# Patient Record
Sex: Female | Born: 1955 | Race: Black or African American | Hispanic: No | Smoking: Never smoker
Health system: Southern US, Community
[De-identification: ages and names within clinical notes are randomized; demographics above are authoritative.]

## PROBLEM LIST (undated history)

## (undated) DIAGNOSIS — E785 Hyperlipidemia, unspecified: Secondary | ICD-10-CM

## (undated) DIAGNOSIS — K5790 Diverticulosis of intestine, part unspecified, without perforation or abscess without bleeding: Secondary | ICD-10-CM

## (undated) DIAGNOSIS — R5383 Other fatigue: Secondary | ICD-10-CM

## (undated) DIAGNOSIS — E119 Type 2 diabetes mellitus without complications: Secondary | ICD-10-CM

## (undated) DIAGNOSIS — IMO0001 Reserved for inherently not codable concepts without codable children: Secondary | ICD-10-CM

## (undated) DIAGNOSIS — I10 Essential (primary) hypertension: Secondary | ICD-10-CM

## (undated) HISTORY — PX: HERNIA REPAIR: SHX51

## (undated) HISTORY — DX: Type 2 diabetes mellitus without complications: E11.9

## (undated) HISTORY — DX: Reserved for inherently not codable concepts without codable children: IMO0001

## (undated) HISTORY — PX: OVARIAN CYST REMOVAL: SHX89

## (undated) HISTORY — PX: BREAST BIOPSY: SHX20

## (undated) HISTORY — PX: OTHER SURGICAL HISTORY: SHX169

## (undated) HISTORY — DX: Other fatigue: R53.83

---

## 2002-09-05 ENCOUNTER — Encounter: Admission: RE | Admit: 2002-09-05 | Discharge: 2002-09-05 | Payer: Self-pay | Admitting: Family Medicine

## 2002-09-05 ENCOUNTER — Encounter: Payer: Self-pay | Admitting: Family Medicine

## 2004-06-09 ENCOUNTER — Encounter: Admission: RE | Admit: 2004-06-09 | Discharge: 2004-06-09 | Payer: Self-pay | Admitting: Family Medicine

## 2006-05-31 ENCOUNTER — Emergency Department (HOSPITAL_COMMUNITY): Admission: EM | Admit: 2006-05-31 | Discharge: 2006-05-31 | Payer: Self-pay | Admitting: Emergency Medicine

## 2006-11-06 ENCOUNTER — Emergency Department (HOSPITAL_COMMUNITY): Admission: EM | Admit: 2006-11-06 | Discharge: 2006-11-06 | Payer: Self-pay | Admitting: Emergency Medicine

## 2011-07-06 ENCOUNTER — Encounter: Payer: Self-pay | Admitting: Family Medicine

## 2011-07-06 DIAGNOSIS — R5383 Other fatigue: Secondary | ICD-10-CM | POA: Insufficient documentation

## 2014-04-22 ENCOUNTER — Other Ambulatory Visit: Payer: Self-pay | Admitting: Emergency Medicine

## 2014-04-22 DIAGNOSIS — Z1231 Encounter for screening mammogram for malignant neoplasm of breast: Secondary | ICD-10-CM

## 2014-04-27 ENCOUNTER — Inpatient Hospital Stay: Admission: RE | Admit: 2014-04-27 | Payer: Self-pay | Source: Ambulatory Visit

## 2014-05-04 ENCOUNTER — Ambulatory Visit
Admission: RE | Admit: 2014-05-04 | Discharge: 2014-05-04 | Disposition: A | Discharge status: Home / Self Care | Payer: Federal, State, Local not specified - PPO | Source: Ambulatory Visit | Attending: Emergency Medicine | Admitting: Emergency Medicine

## 2014-05-04 DIAGNOSIS — Z1231 Encounter for screening mammogram for malignant neoplasm of breast: Secondary | ICD-10-CM

## 2014-05-08 ENCOUNTER — Other Ambulatory Visit: Payer: Self-pay | Admitting: Family Medicine

## 2014-05-08 DIAGNOSIS — R928 Other abnormal and inconclusive findings on diagnostic imaging of breast: Secondary | ICD-10-CM

## 2014-05-13 ENCOUNTER — Ambulatory Visit
Admission: RE | Admit: 2014-05-13 | Discharge: 2014-05-13 | Disposition: A | Discharge status: Home / Self Care | Payer: Federal, State, Local not specified - PPO | Source: Ambulatory Visit | Attending: Family Medicine | Admitting: Family Medicine

## 2014-05-13 DIAGNOSIS — R928 Other abnormal and inconclusive findings on diagnostic imaging of breast: Secondary | ICD-10-CM

## 2014-12-19 IMAGING — MG MM DIGITAL DIAGNOSTIC UNILAT*R*
2 series · 2 of 2 positions shown · non-contrast
Comparison: Previous exams.

ADDENDUM:
The patient's prior films from 11/08/2012 have been made available.
The asymmetry seen in the slightly lower slightly outer right breast
anterior depth appears unchanged when compared to the prior exam.
There is no mammographic evidence of malignancy in the right breast.

Recommendation: Recommend return to routine screening mammography
BI-RADS: 2: Benign.
CLINICAL DATA: Screening recall for a possible right breast mass.
EXAM:
DIGITAL DIAGNOSTIC  RIGHT MAMMOGRAM
ULTRASOUND RIGHT BREAST

[R CC]
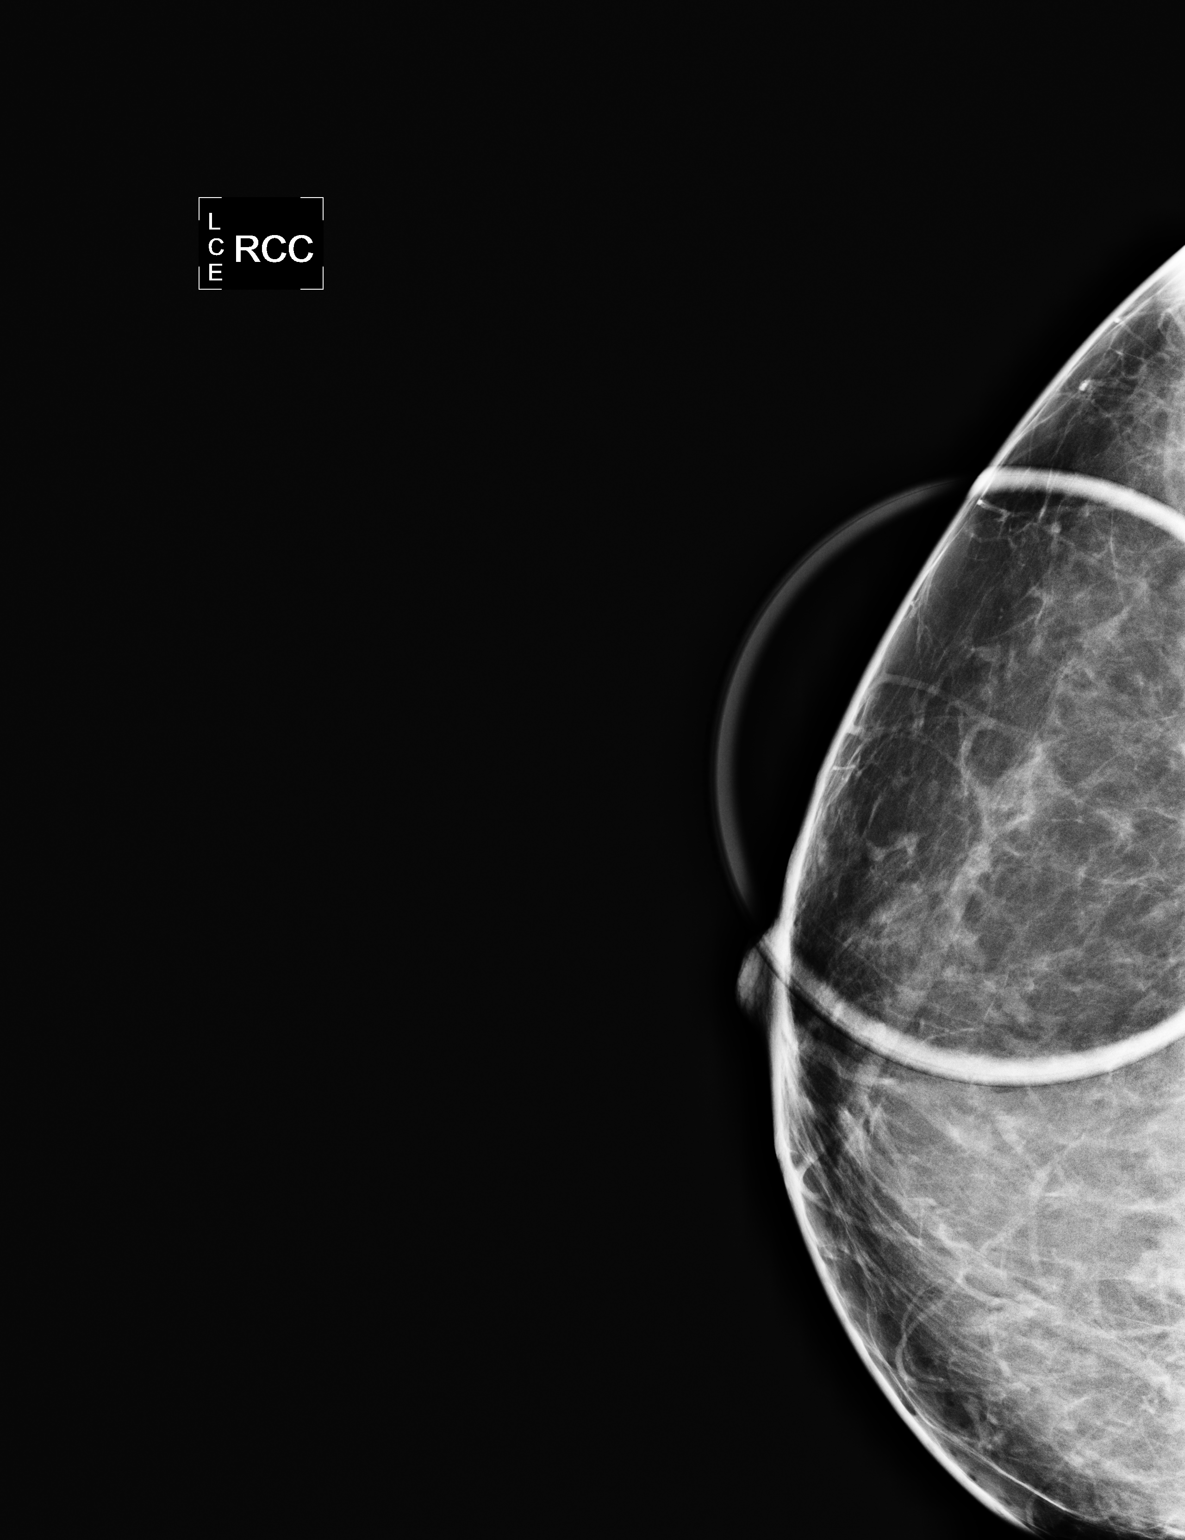

[R MLO]
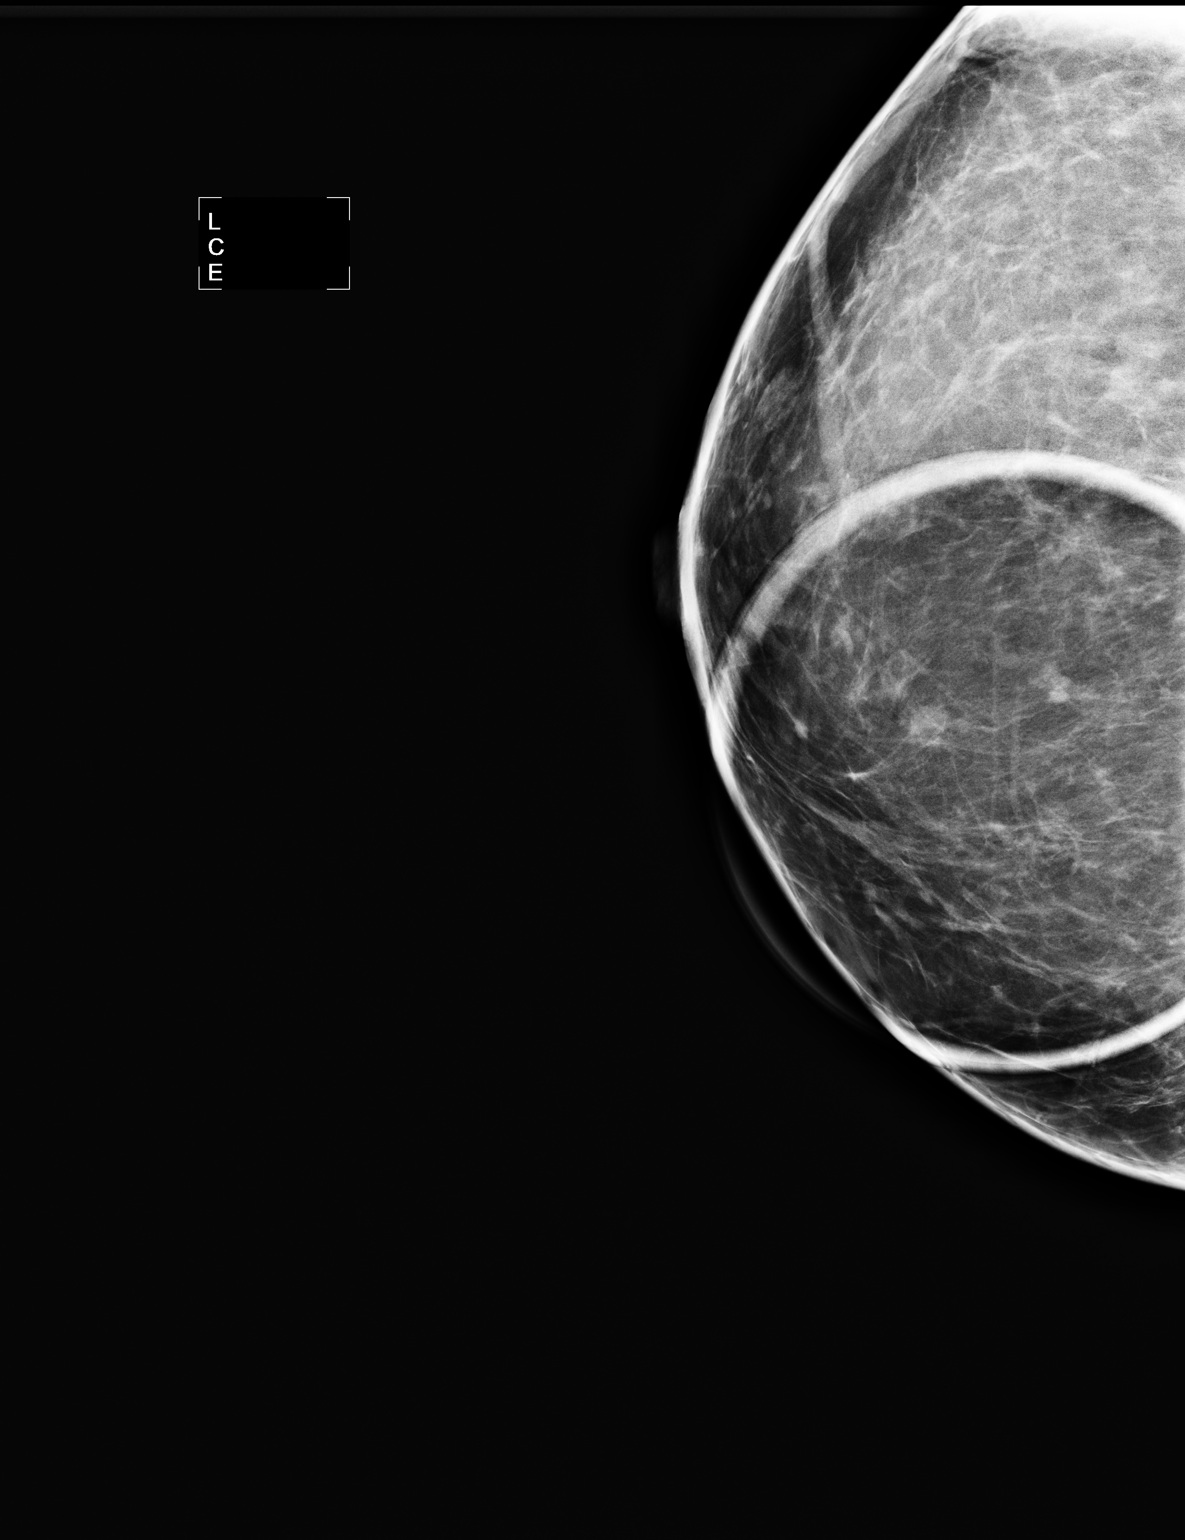

[2 of 2 positions shown; findings below may reference images not displayed]

ACR Breast Density Category b: There are scattered areas of
fibroglandular density.
FINDINGS: There is a small mass in the slightly lower slightly lateral right
breast which is confirmed on the additional spot compression CC and
MLO views measuring approximately 6 mm.

Physical examination of the slightly outer and slightly lower right
breast does not reveal any palpable masses.

Targeted ultrasound of the right breast was performed demonstrating
a small intramammary lymph node at 7 o'clock 2 cm from the nipple
measuring 0.5 x 0.2 x 0.5 cm. This likely corresponds with
mammography findings.
IMPRESSION: Small intramammary lymph node in the right breast which likely
corresponds with the possible mass seen on mammography.

RECOMMENDATION:
1. A six-month follow-up right breast mammogram is recommended to
demonstrate stability of the probably benign right breast asymmetry
(which likely corresponds with the possible mass seen on
mammography).

2. Patient's prior films from [address] in [place] have been requested.
If these films become available, an addendum will be made to this
report.

I have discussed the findings and recommendations with the patient.
Results were also provided in writing at the conclusion of the
visit. If applicable, a reminder letter will be sent to the patient
regarding the next appointment.

BI-RADS CATEGORY  0: Incomplete. Need additional imaging evaluation
and/or prior mammograms for comparison.

## 2014-12-19 IMAGING — US US BREAST LTD UNI RIGHT INC AXILLA
1 series · 5 of 5 positions shown · non-contrast
Comparison: Previous exams.

ADDENDUM:
The patient's prior films from 11/08/2012 have been made available.
The asymmetry seen in the slightly lower slightly outer right breast
anterior depth appears unchanged when compared to the prior exam.
There is no mammographic evidence of malignancy in the right breast.

Recommendation: Recommend return to routine screening mammography
BI-RADS: 2: Benign.
CLINICAL DATA: Screening recall for a possible right breast mass.
EXAM:
DIGITAL DIAGNOSTIC  RIGHT MAMMOGRAM
ULTRASOUND RIGHT BREAST

[Series 1: us breast ltd uni right inc axilla · 5 of 5 slices shown]
[im 1/5]
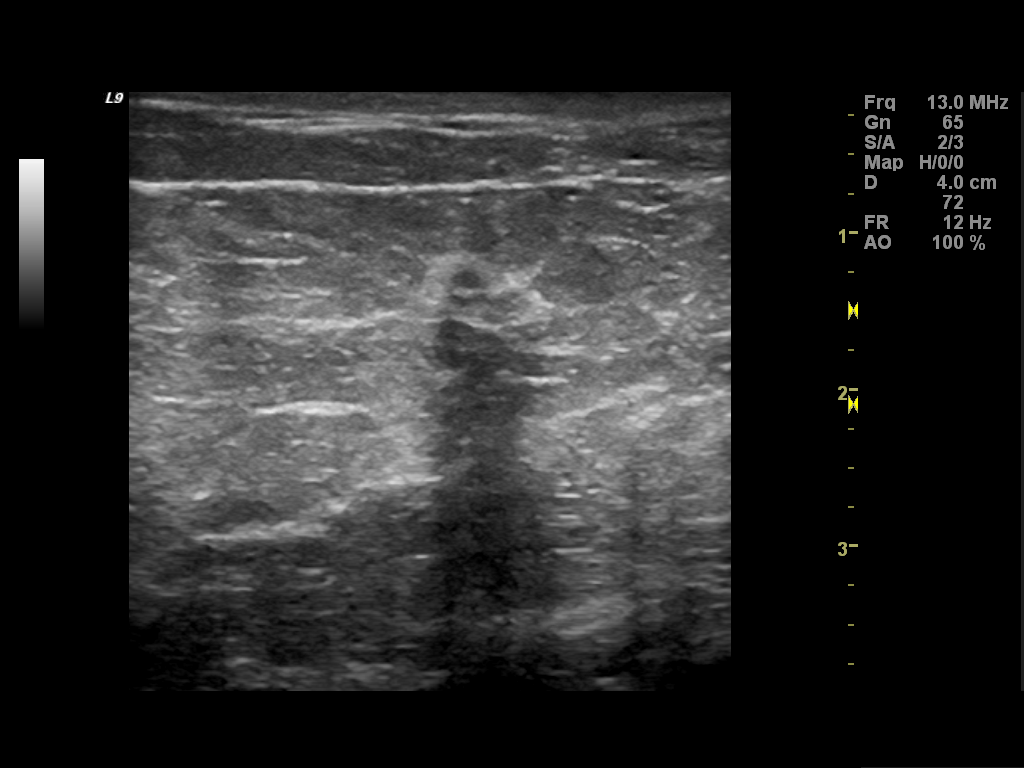
[im 2/5]
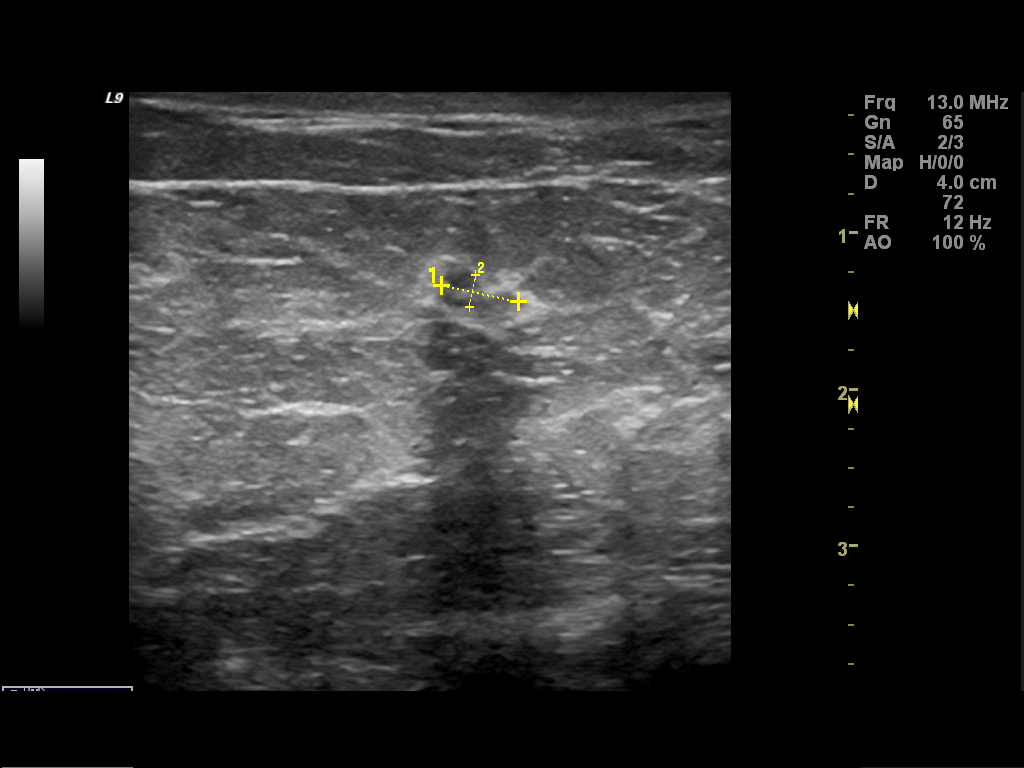
[im 3/5]
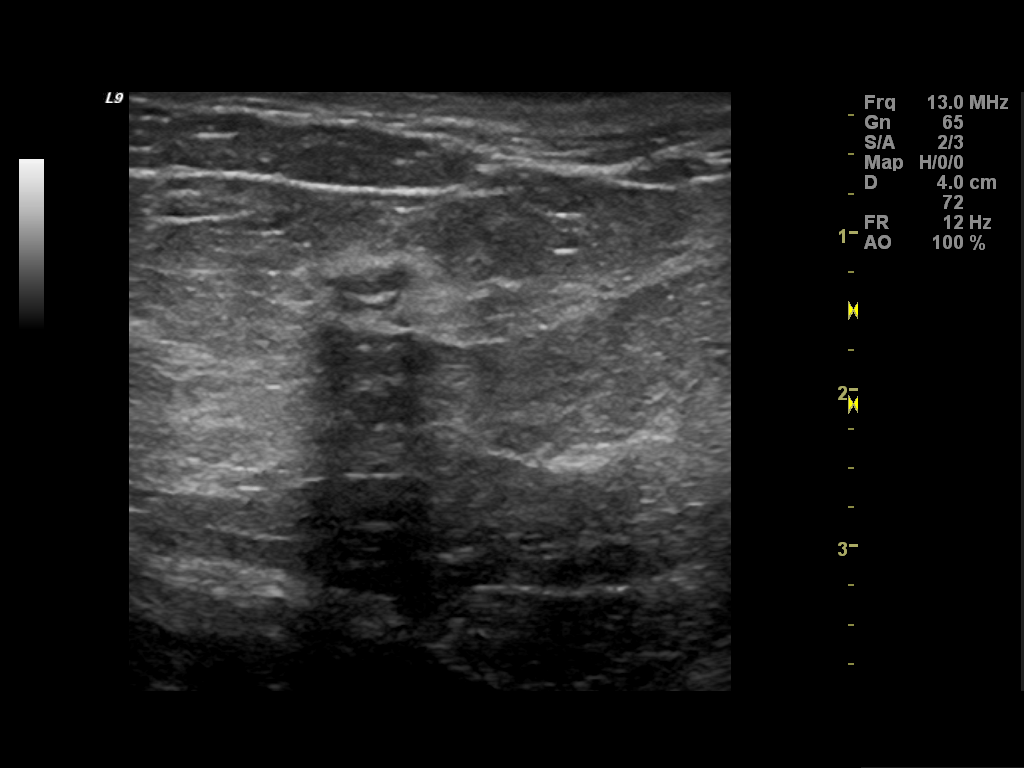
[im 4/5]
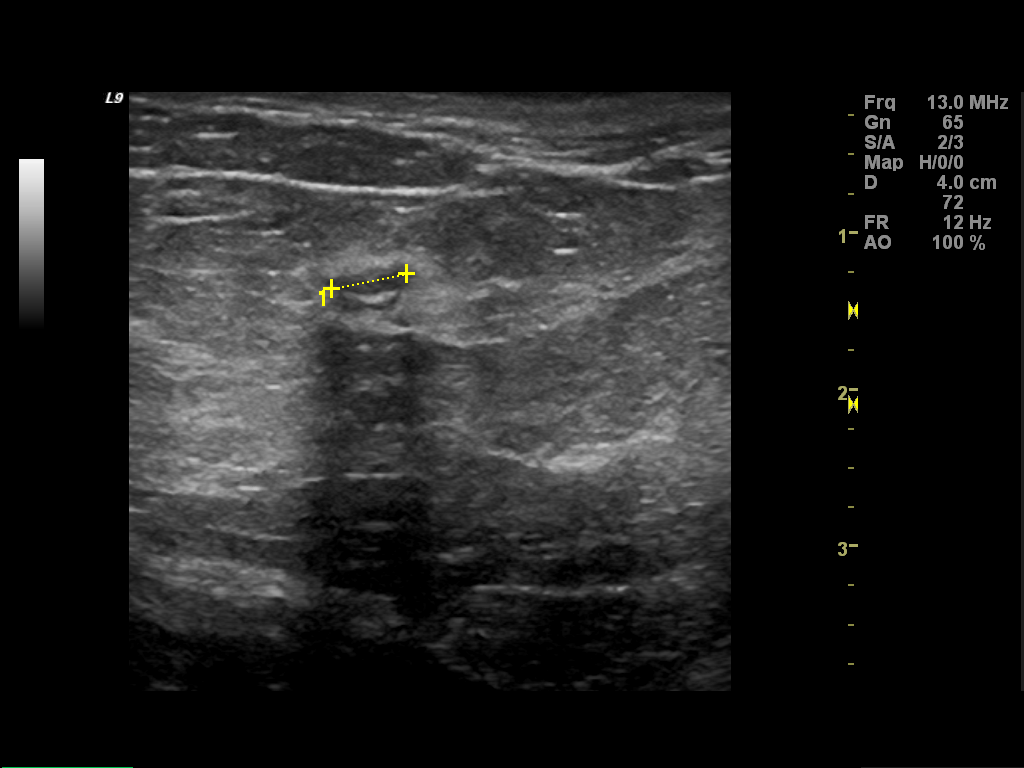
[im 5/5]
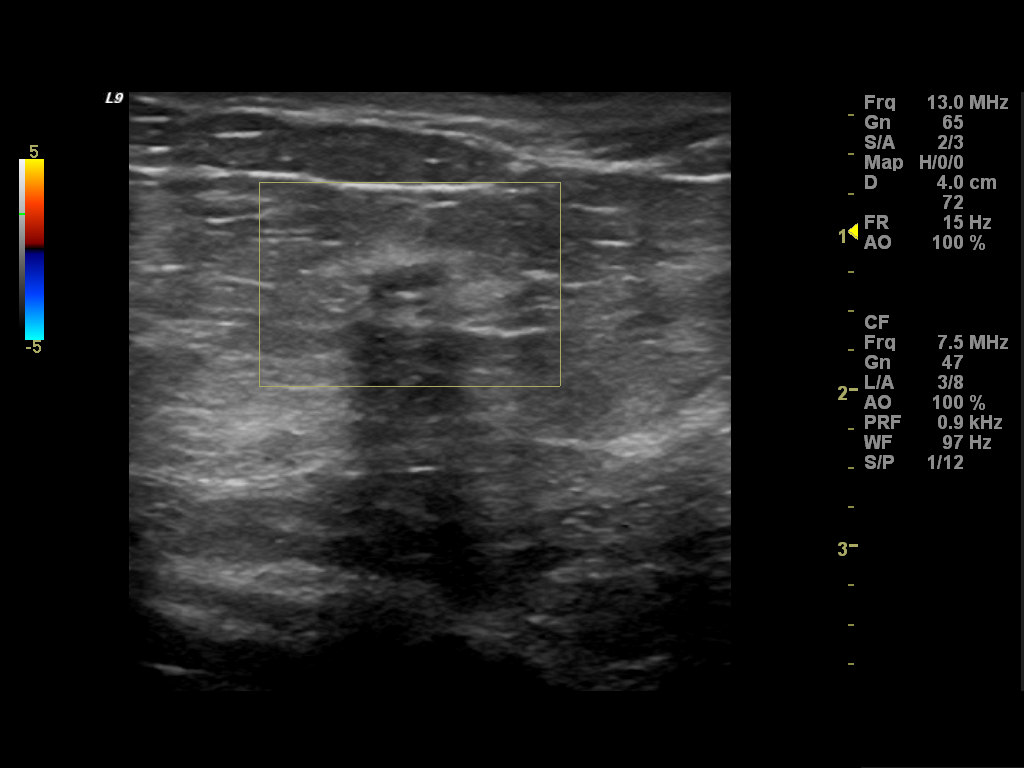

[5 of 5 positions shown; findings below may reference images not displayed]

ACR Breast Density Category b: There are scattered areas of
fibroglandular density.
FINDINGS: There is a small mass in the slightly lower slightly lateral right
breast which is confirmed on the additional spot compression CC and
MLO views measuring approximately 6 mm.

Physical examination of the slightly outer and slightly lower right
breast does not reveal any palpable masses.

Targeted ultrasound of the right breast was performed demonstrating
a small intramammary lymph node at 7 o'clock 2 cm from the nipple
measuring 0.5 x 0.2 x 0.5 cm. This likely corresponds with
mammography findings.
IMPRESSION: Small intramammary lymph node in the right breast which likely
corresponds with the possible mass seen on mammography.

RECOMMENDATION:
1. A six-month follow-up right breast mammogram is recommended to
demonstrate stability of the probably benign right breast asymmetry
(which likely corresponds with the possible mass seen on
mammography).

2. Patient's prior films from [address] in [place] have been requested.
If these films become available, an addendum will be made to this
report.

I have discussed the findings and recommendations with the patient.
Results were also provided in writing at the conclusion of the
visit. If applicable, a reminder letter will be sent to the patient
regarding the next appointment.

BI-RADS CATEGORY  0: Incomplete. Need additional imaging evaluation
and/or prior mammograms for comparison.

## 2020-01-22 ENCOUNTER — Encounter: Payer: Self-pay | Admitting: Gastroenterology

## 2020-01-27 ENCOUNTER — Ambulatory Visit (INDEPENDENT_AMBULATORY_CARE_PROVIDER_SITE_OTHER): Payer: Federal, State, Local not specified - PPO | Admitting: Gastroenterology

## 2020-01-27 ENCOUNTER — Encounter: Payer: Self-pay | Admitting: Gastroenterology

## 2020-01-27 VITALS — BP 124/86 | HR 70 | Temp 97.8°F | Ht 66.0 in | Wt 167.0 lb

## 2020-01-27 DIAGNOSIS — K921 Melena: Secondary | ICD-10-CM

## 2020-01-27 DIAGNOSIS — Z01818 Encounter for other preprocedural examination: Secondary | ICD-10-CM | POA: Diagnosis not present

## 2020-01-27 MED ORDER — SUPREP BOWEL PREP KIT 17.5-3.13-1.6 GM/177ML PO SOLN: 1.0000 | ORAL | 0 refills | Status: DC

## 2020-01-27 NOTE — Patient Instructions (Signed)
If you are age 66 or older, your body mass index should be between 23-30. Your Body mass index is 26.95 kg/m. If this is out of the aforementioned range listed, please consider follow up with your Primary Care Provider.  If you are age 35 or younger, your body mass index should be between 19-25. Your Body mass index is 26.95 kg/m. If this is out of the aformentioned range listed, please consider follow up with your Primary Care Provider.   You have been scheduled for a colonoscopy. Please follow written instructions given to you at your visit today.  Please pick up your prep supplies at the pharmacy within the next 1-3 days. If you use inhalers (even only as needed), please bring them with you on the day of your procedure.   Due to recent changes in healthcare laws, you may see the results of your imaging and laboratory studies on MyChart before your provider has had a chance to review them.  We understand that in some cases there may be results that are confusing or concerning to you. Not all laboratory results come back in the same time frame and the provider may be waiting for multiple results in order to interpret others.  Please give [place] 48 hours in order for your provider to thoroughly review all the results before contacting the office for clarification of your results.  Thank you, Dr Ardis Hughs

## 2020-01-27 NOTE — Progress Notes (Signed)
HPI: This is a very pleasant 64 year old woman who was referred by Dr. Osborne Casco for Hemoccult positive stool  She had Hemoccult testing, home samples and at least one of them was positive.  She has never seen blood in her stool.  She really has no issues with her bowels.  Specifically no constipation, no diarrhea, no severe abdominal pains.  Her weight has been slightly decreasing over the past several months, she has changed her diet because she is less active lately.  Colon cancer does not run in her family.  She had a colonoscopy in 2009 while living in [place] and believes that it was completely normal.  This was for routine cancer screening from what she can recall.    Review of systems: Pertinent positive and negative review of systems were noted in the above HPI section. All other review negative.   Past Medical History:  Diagnosis Date  . Fatigue   . NIDDM (non-insulin dependent diabetes mellitus)    Borderline    Past Surgical History:  Procedure Laterality Date  . BREAST BIOPSY    . BTL    . HERNIA REPAIR    . OVARIAN CYST REMOVAL      Current Outpatient Medications  Medication Sig Dispense Refill  . Multiple Vitamin (MULTIVITAMIN) capsule Take 1 capsule by mouth daily.    . metFORMIN (GLUCOPHAGE) 850 MG tablet metformin 850 mg tablet  TAKE 1 TABLET BY MOUTH TWICE DAILY WITH MEALS     No current facility-administered medications for this visit.    Allergies as of 01/27/2020 - Review Complete 01/27/2020  Allergen Reaction Noted  . Lisinopril Swelling 01/27/2020  . Simvastatin Swelling 01/27/2020    Family History  Problem Relation Age of Onset  . COPD Mother   . Cancer Father   . Colon polyps Sister   . Colon cancer Neg Hx   . Esophageal cancer Neg Hx   . Pancreatic cancer Neg Hx   . Stomach cancer Neg Hx     Social History   Socioeconomic History  . Marital status: Divorced    Spouse name: Not on file  . Number of children: Not on file  . Years  of education: Not on file  . Highest education level: Not on file  Occupational History  . Not on file  Tobacco Use  . Smoking status: Never Smoker  . Smokeless tobacco: Never Used  Substance and Sexual Activity  . Alcohol use: Not Currently  . Drug use: Never  . Sexual activity: Not on file  Other Topics Concern  . Not on file  Social History Narrative  . Not on file   Social Determinants of Health   Financial Resource Strain:   . Difficulty of Paying Living Expenses: Not on file  Food Insecurity:   . Worried About Charity fundraiser in the Last Year: Not on file  . Ran Out of Food in the Last Year: Not on file  Transportation Needs:   . Lack of Transportation (Medical): Not on file  . Lack of Transportation (Non-Medical): Not on file  Physical Activity:   . Days of Exercise per Week: Not on file  . Minutes of Exercise per Session: Not on file  Stress:   . Feeling of Stress : Not on file  Social Connections:   . Frequency of Communication with Friends and Family: Not on file  . Frequency of Social Gatherings with Friends and Family: Not on file  . Attends Religious Services: Not on  file  . Active Member of Clubs or Organizations: Not on file  . Attends Archivist Meetings: Not on file  . Marital Status: Not on file  Intimate Partner Violence:   . Fear of Current or Ex-Partner: Not on file  . Emotionally Abused: Not on file  . Physically Abused: Not on file  . Sexually Abused: Not on file     Physical Exam: BP 124/86 (BP Location: Left Arm, Patient Position: Sitting, Cuff Size: Normal)   Pulse 70   Temp 97.8 F (36.6 C)   Ht 5\' 6"  (1.676 m)   Wt 167 lb (75.8 kg)   SpO2 98%   BMI 26.95 kg/m  Constitutional: generally well-appearing Psychiatric: alert and oriented x3 Eyes: extraocular movements intact Mouth: oral pharynx moist, no lesions Neck: supple no lymphadenopathy Cardiovascular: heart regular rate and rhythm Lungs: clear to auscultation  bilaterally Abdomen: soft, nontender, nondistended, no obvious ascites, no peritoneal signs, normal bowel sounds Extremities: no lower extremity edema bilaterally Skin: no lesions on visible extremities   Assessment and plan: 64 y.o. female with Hemoccult positive stool  I recommended a colonoscopy at her soonest convenience and see no reason for any further blood tests or imaging studies prior to then.   Please see the "Patient Instructions" section for addition details about the plan.   Owens Loffler, MD [facility] 01/27/2020, 10:38 AM  Cc: Susy Frizzle, MD  Total time on date of encounter was 30  minutes (this included time spent preparing to see the patient reviewing records; obtaining and/or reviewing separately obtained history; performing a medically appropriate exam and/or evaluation; counseling and educating the patient and family if present; ordering medications, tests or procedures if applicable; and documenting clinical information in the health record).

## 2020-02-13 ENCOUNTER — Other Ambulatory Visit: Payer: Self-pay | Admitting: Gastroenterology

## 2020-02-13 ENCOUNTER — Ambulatory Visit (INDEPENDENT_AMBULATORY_CARE_PROVIDER_SITE_OTHER): Payer: Federal, State, Local not specified - PPO

## 2020-02-13 DIAGNOSIS — Z1159 Encounter for screening for other viral diseases: Secondary | ICD-10-CM

## 2020-02-14 LAB — SARS CORONAVIRUS 2 (TAT 6-24 HRS): SARS Coronavirus 2: NEGATIVE

## 2020-02-17 ENCOUNTER — Encounter: Payer: Self-pay | Admitting: Gastroenterology

## 2020-02-17 ENCOUNTER — Other Ambulatory Visit: Payer: Self-pay

## 2020-02-17 ENCOUNTER — Ambulatory Visit (AMBULATORY_SURGERY_CENTER): Payer: Federal, State, Local not specified - PPO | Admitting: Gastroenterology

## 2020-02-17 VITALS — BP 145/79 | HR 61 | Temp 97.7°F | Resp 15 | Ht 66.0 in | Wt 167.0 lb

## 2020-02-17 DIAGNOSIS — K921 Melena: Secondary | ICD-10-CM

## 2020-02-17 DIAGNOSIS — K5730 Diverticulosis of large intestine without perforation or abscess without bleeding: Secondary | ICD-10-CM | POA: Diagnosis not present

## 2020-02-17 DIAGNOSIS — R195 Other fecal abnormalities: Secondary | ICD-10-CM | POA: Diagnosis not present

## 2020-02-17 DIAGNOSIS — D122 Benign neoplasm of ascending colon: Secondary | ICD-10-CM | POA: Diagnosis not present

## 2020-02-17 HISTORY — PX: COLONOSCOPY: SHX174

## 2020-02-17 MED ORDER — SODIUM CHLORIDE 0.9 % IV SOLN: 500.0000 mL | Freq: Once | INTRAVENOUS | Status: DC

## 2020-02-17 NOTE — Patient Instructions (Signed)
Handouts provided on polyps and diverticulosis.   YOU HAD AN ENDOSCOPIC PROCEDURE TODAY AT THE [facility]:   Refer to the procedure report that was given to you for any specific questions about what was found during the examination.  If the procedure report does not answer your questions, please call your gastroenterologist to clarify.  If you requested that your care partner not be given the details of your procedure findings, then the procedure report has been included in a sealed envelope for you to review at your convenience later.  YOU SHOULD EXPECT: Some feelings of bloating in the abdomen. Passage of more gas than usual.  Walking can help get rid of the air that was put into your GI tract during the procedure and reduce the bloating. If you had a lower endoscopy (such as a colonoscopy or flexible sigmoidoscopy) you may notice spotting of blood in your stool or on the toilet paper. If you underwent a bowel prep for your procedure, you may not have a normal bowel movement for a few days.  Please Note:  You might notice some irritation and congestion in your nose or some drainage.  This is from the oxygen used during your procedure.  There is no need for concern and it should clear up in a day or so.  SYMPTOMS TO REPORT IMMEDIATELY:   Following lower endoscopy (colonoscopy or flexible sigmoidoscopy):  Excessive amounts of blood in the stool  Significant tenderness or worsening of abdominal pains  Swelling of the abdomen that is new, acute  Fever of 100F or higher  For urgent or emergent issues, a gastroenterologist can be reached at any hour by calling [phone].   DIET:  We do recommend a small meal at first, but then you may proceed to your regular diet.  Drink plenty of fluids but you should avoid alcoholic beverages for 24 hours.  ACTIVITY:  You should plan to take it easy for the rest of today and you should NOT DRIVE or use heavy machinery until tomorrow (because  of the sedation medicines used during the test).    FOLLOW UP: Our staff will call the number listed on your records 48-72 hours following your procedure to check on you and address any questions or concerns that you may have regarding the information given to you following your procedure. If we do not reach you, we will leave a message.  We will attempt to reach you two times.  During this call, we will ask if you have developed any symptoms of COVID 19. If you develop any symptoms (ie: fever, flu-like symptoms, shortness of breath, cough etc.) before then, please call [phone].  If you test positive for Covid 19 in the 2 weeks post procedure, please call and report this information to us.    If any biopsies were taken you will be contacted by phone or by letter within the next 1-3 weeks.  Please call us at [phone] if you have not heard about the biopsies in 3 weeks.    SIGNATURES/CONFIDENTIALITY: You and/or your care partner have signed paperwork which will be entered into your electronic medical record.  These signatures attest to the fact that that the information above on your After Visit Summary has been reviewed and is understood.  Full responsibility of the confidentiality of this discharge information lies with you and/or your care-partner.  

## 2020-02-17 NOTE — Op Note (Signed)
[facility] Patient Name: Monica Austin Procedure Date: 02/17/2020 3:07 PM MRN: [id number] Endoscopist: Milus Banister , MD Age: 63 Referring MD:  Date of Birth: 06-16-1956 Gender: Female Account #: [id number] Procedure:                Colonoscopy Indications:              Heme positive stool Medicines:                Monitored Anesthesia Care Procedure:                Pre-Anesthesia Assessment:                           - Prior to the procedure, a History and Physical                            was performed, and patient medications and                            allergies were reviewed. The patient's tolerance of                            previous anesthesia was also reviewed. The risks                            and benefits of the procedure and the sedation                            options and risks were discussed with the patient.                            All questions were answered, and informed consent                            was obtained. Prior Anticoagulants: The patient has                            taken no previous anticoagulant or antiplatelet                            agents. ASA Grade Assessment: II - A patient with                            mild systemic disease. After reviewing the risks                            and benefits, the patient was deemed in                            satisfactory condition to undergo the procedure.                           After obtaining informed consent, the colonoscope  was passed under direct vision. Throughout the                            procedure, the patient's blood pressure, pulse, and                            oxygen saturations were monitored continuously. The                            Colonoscope was introduced through the anus and                            advanced to the the cecum, identified by                            appendiceal orifice and ileocecal valve. The                          colonoscopy was performed without difficulty. The                            patient tolerated the procedure well. The quality                            of the bowel preparation was good. The ileocecal                            valve, appendiceal orifice, and rectum were                            photographed. Scope In: 3:11:46 PM Scope Out: 3:25:17 PM Scope Withdrawal Time: 0 hours 9 minutes 29 seconds  Total Procedure Duration: 0 hours 13 minutes 31 seconds  Findings:                 A 2 mm polyp was found in the ascending colon. The                            polyp was sessile. The polyp was removed with a                            cold snare. Resection and retrieval were complete.                           Multiple small and large-mouthed diverticula were                            found in the left colon.                           The exam was otherwise without abnormality on                            direct and retroflexion views. Complications:  No immediate complications. Estimated blood loss:                            None. Estimated Blood Loss:     Estimated blood loss: none. Impression:               - One 2 mm polyp in the ascending colon, removed                            with a cold snare. Resected and retrieved.                           - Diverticulosis in the left colon.                           - The examination was otherwise normal on direct                            and retroflexion views. Recommendation:           - Patient has a contact number available for                            emergencies. The signs and symptoms of potential                            delayed complications were discussed with the                            patient. Return to normal activities tomorrow.                            Written discharge instructions were provided to the                            patient.                           - Resume  previous diet.                           - Continue present medications.                           - Await pathology results. Milus Banister, MD 02/17/2020 3:28:37 PM This report has been signed electronically.

## 2020-02-17 NOTE — Progress Notes (Signed)
Temp check by:LC Vital check by:DT  The medical and surgical history was reviewed and verified with the patient. 

## 2020-02-17 NOTE — Progress Notes (Signed)
Report to PACU, RN, vss, BBS= Clear.  

## 2020-02-17 NOTE — Progress Notes (Signed)
Called to room to assist during endoscopic procedure.  Patient ID and intended procedure confirmed with present staff. Received instructions for my participation in the procedure from the performing physician.  

## 2020-02-19 ENCOUNTER — Telehealth: Payer: Self-pay | Admitting: *Deleted

## 2020-02-19 ENCOUNTER — Telehealth: Payer: Self-pay

## 2020-02-19 NOTE — Telephone Encounter (Signed)
No answer on 2nd follow up call.

## 2020-02-19 NOTE — Telephone Encounter (Signed)
NO answer for post procedure call back. Will call back later this afternoon. SM

## 2020-02-20 ENCOUNTER — Other Ambulatory Visit: Payer: Self-pay

## 2020-02-20 ENCOUNTER — Ambulatory Visit: Payer: Federal, State, Local not specified - PPO | Attending: Internal Medicine

## 2020-02-20 ENCOUNTER — Ambulatory Visit: Payer: Federal, State, Local not specified - PPO

## 2020-02-20 DIAGNOSIS — Z23 Encounter for immunization: Secondary | ICD-10-CM | POA: Insufficient documentation

## 2020-02-20 NOTE — Progress Notes (Signed)
   Covid-19 Vaccination Clinic  Name:  Monica Austin    MRN: [id number] DOB: 1956/11/15  02/20/2020  [name] was observed post Covid-19 immunization for 15 minutes without incidence. She was provided with Vaccine Information Sheet and instruction to access the V-Safe system.   [name] was instructed to call 911 with any severe reactions post vaccine: Marland Kitchen Difficulty breathing  . Swelling of your face and throat  . A fast heartbeat  . A bad rash all over your body  . Dizziness and weakness    Immunizations Administered    Name Date Dose VIS Date Route   Pfizer COVID-19 Vaccine 02/20/2020  4:44 PM 0.3 mL 12/05/2019 Intramuscular   Manufacturer: [place]   Lot: [id number]   [place]: [id number]

## 2020-02-23 ENCOUNTER — Encounter: Payer: Self-pay | Admitting: Gastroenterology

## 2020-03-17 ENCOUNTER — Ambulatory Visit: Payer: Federal, State, Local not specified - PPO | Attending: Internal Medicine

## 2020-03-17 DIAGNOSIS — Z23 Encounter for immunization: Secondary | ICD-10-CM

## 2020-03-17 NOTE — Progress Notes (Signed)
   Covid-19 Vaccination Clinic  Name:  Monica Austin    MRN: [id number] DOB: 1956-04-22  03/17/2020  Monica Austin was observed post Covid-19 immunization for 15 minutes without incident. She was provided with Vaccine Information Sheet and instruction to access the V-Safe system.   Monica Austin was instructed to call 911 with any severe reactions post vaccine: Marland Kitchen Difficulty breathing  . Swelling of face and throat  . A fast heartbeat  . A bad rash all over body  . Dizziness and weakness   Immunizations Administered    Name Date Dose VIS Date Route   Pfizer COVID-19 Vaccine 03/17/2020  3:55 PM 0.3 mL 12/05/2019 Intramuscular   Manufacturer: [place]   Lot: [id number]   [place]: [id number]

## 2022-06-02 ENCOUNTER — Ambulatory Visit (INDEPENDENT_AMBULATORY_CARE_PROVIDER_SITE_OTHER): Payer: BLUE CROSS/BLUE SHIELD

## 2022-06-02 ENCOUNTER — Encounter (HOSPITAL_COMMUNITY): Payer: Self-pay | Admitting: Emergency Medicine

## 2022-06-02 ENCOUNTER — Ambulatory Visit (HOSPITAL_COMMUNITY)
Admission: EM | Admit: 2022-06-02 | Discharge: 2022-06-02 | Disposition: A | Discharge status: Home / Self Care | Payer: BLUE CROSS/BLUE SHIELD | Attending: Emergency Medicine | Admitting: Emergency Medicine

## 2022-06-02 DIAGNOSIS — R051 Acute cough: Secondary | ICD-10-CM | POA: Diagnosis not present

## 2022-06-02 DIAGNOSIS — R0989 Other specified symptoms and signs involving the circulatory and respiratory systems: Secondary | ICD-10-CM | POA: Diagnosis not present

## 2022-06-02 DIAGNOSIS — R059 Cough, unspecified: Secondary | ICD-10-CM | POA: Diagnosis not present

## 2022-06-02 MED ORDER — PREDNISONE 20 MG PO TABS: 40.0000 mg | ORAL_TABLET | Freq: Every day | ORAL | 0 refills | Status: DC

## 2022-06-02 MED ORDER — AZITHROMYCIN 250 MG PO TABS: 250.0000 mg | ORAL_TABLET | Freq: Every day | ORAL | 0 refills | Status: DC

## 2022-06-02 MED ORDER — CETIRIZINE HCL 10 MG PO TABS: 10.0000 mg | ORAL_TABLET | Freq: Every day | ORAL | 0 refills | Status: DC

## 2022-06-02 NOTE — ED Provider Notes (Signed)
[place]    CSN: [id number] Arrival date & time: 06/02/22  1115      History   Chief Complaint Chief Complaint  Patient presents with   chest congestion    Cough    HPI ATIANNA HAIDAR is a 66 y.o. female.   Patient presents with intermittent productive cough and chest congestion for 3 weeks.  Initially symptoms started as a head cold with nasal congestion and sinus pressure but patient endorses this is moving down to the chest.  Tolerating food and liquids.  No known sick contacts.  Was evaluated by PCP who started patient on Augmentin 10-day course, Flonase and for treatment of a sinus infection.  Endorses symptoms have persisted past use of bronchospasms, diabetes.  Denies chest pain or tightness, shortness of breath or wheezing.     Past Medical History:  Diagnosis Date   Fatigue    NIDDM (non-insulin dependent diabetes mellitus)    Borderline    Patient Active Problem List   Diagnosis Date Noted   Fatigue     Past Surgical History:  Procedure Laterality Date   BREAST BIOPSY     BTL     COLONOSCOPY  02/17/2020   HERNIA REPAIR     OVARIAN CYST REMOVAL      OB History   No obstetric history on file.      Home Medications    Prior to Admission medications   Medication Sig Start Date End Date Taking? Authorizing Provider  metFORMIN (GLUCOPHAGE) 850 MG tablet metformin 850 mg tablet  TAKE 1 TABLET BY MOUTH TWICE DAILY WITH MEALS    [provider]  Multiple Vitamin (MULTIVITAMIN) capsule Take 1 capsule by mouth daily.    [provider]    Family History Family History  Problem Relation Age of Onset   COPD Mother    Cancer Father    Colon polyps Sister    Colon cancer Neg Hx    Esophageal cancer Neg Hx    Pancreatic cancer Neg Hx    Stomach cancer Neg Hx     Social History Social History   Tobacco Use   Smoking status: Never   Smokeless tobacco: Never  Vaping Use   Vaping Use: Never used  Substance Use  Topics   Alcohol use: Not Currently   Drug use: Never     Allergies   Lisinopril and Simvastatin   Review of Systems Review of Systems  Constitutional: Negative.   HENT: Negative.    Respiratory:  Positive for cough. Negative for apnea, choking, chest tightness, shortness of breath, wheezing and stridor.   Cardiovascular: Negative.   Gastrointestinal: Negative.   Skin: Negative.   Neurological: Negative.      Physical Exam Triage Vital Signs ED Triage Vitals  Enc Vitals Group     BP 06/02/22 1204 (!) 161/87     Pulse Rate 06/02/22 1204 62     Resp 06/02/22 1204 17     Temp 06/02/22 1204 98.3 F (36.8 C)     Temp Source 06/02/22 1204 Oral     SpO2 06/02/22 1204 97 %     Weight --      Height --      Head Circumference --      Peak Flow --      Pain Score 06/02/22 1202 6     Pain Loc --      Pain Edu? --      Excl. in [place]? --  No data found.  Updated Vital Signs BP (!) 161/87 (BP Location: Right Arm)   Pulse 62   Temp 98.3 F (36.8 C) (Oral)   Resp 17   SpO2 97%   Visual Acuity Right Eye Distance:   Left Eye Distance:   Bilateral Distance:    Right Eye Near:   Left Eye Near:    Bilateral Near:     Physical Exam Constitutional:      Appearance: Normal appearance.  HENT:     Head: Normocephalic.     Right Ear: Hearing, tympanic membrane, ear canal and external ear normal.     Left Ear: Hearing, tympanic membrane, ear canal and external ear normal.     Nose: Nose normal.     Mouth/Throat:     Mouth: Mucous membranes are moist.     Pharynx: Oropharynx is clear.  Eyes:     Extraocular Movements: Extraocular movements intact.  Cardiovascular:     Rate and Rhythm: Normal rate and regular rhythm.     Pulses: Normal pulses.     Heart sounds: Normal heart sounds.  Pulmonary:     Effort: Pulmonary effort is normal.     Breath sounds: Normal breath sounds.  Musculoskeletal:     Cervical back: Normal range of motion and neck supple.  Skin:     General: Skin is warm and dry.  Neurological:     Mental Status: She is alert and oriented to person, place, and time. Mental status is at baseline.  Psychiatric:        Mood and Affect: Mood normal.        Behavior: Behavior normal.      UC Treatments / Results  Labs (all labs ordered are listed, but only abnormal results are displayed) Labs Reviewed - No data to display  EKG   Radiology DG Chest 2 View  Result Date: 06/02/2022 CLINICAL DATA:  Cough. EXAM: CHEST - 2 VIEW COMPARISON:  None Available. FINDINGS: No consolidation. No visible pleural effusions or pneumothorax. Biapical pleuroparenchymal scarring. Cardiomediastinal silhouette is within normal limits. No displaced fracture. IMPRESSION: No evidence of acute cardiopulmonary disease. Electronically Signed   By: Margaretha Sheffield M.D.   On: 06/02/2022 12:34    Procedures Procedures (including critical care time)  Medications Ordered in UC Medications - No data to display  Initial Impression / Assessment and Plan / UC Course  I have reviewed the triage vital signs and the nursing notes.  Pertinent labs & imaging results that were available during my care of the patient were reviewed by me and considered in my medical decision making (see chart for details).  Acute cough Chest congestion  Vital signs are stable, O2 saturation 97% on room air, lungs are clear to auscultation ,  low suspicion for lung involvement and chest x-ray negative, discussed all findings with patient, as symptoms have been present for 3 weeks without relief, provide additional bacterial coverage, Z-Pak prescribed, prednisone 40 mg burst prescribed for upper airways and recommended patient daily continue increase fluid take, minified air or steam showers may follow-up with PCP for reevaluation if symptoms persist Final Clinical Impressions(s) / UC Diagnoses   Final diagnoses:  None   Discharge Instructions   None    ED Prescriptions   None     PDMP not reviewed this encounter.   Hans Eden, NP 06/02/22 1312

## 2022-06-02 NOTE — ED Triage Notes (Signed)
Pt reports cough and chest congestion x 3 weeks. States PCP prescribed an antibiotic 3 weeks ago and finished course with no relief. States PCP wanted her to get a chest xray to rule out pneumonia.

## 2022-06-02 NOTE — Discharge Instructions (Addendum)
Chest x- ray negative for infection, fluid, on exam your lungs are clear when listened to and you are getting enough air at this time there is no involvement of your lungs  We will attempt use for additional bacteria coverage to the upper airways, Take azithrmycin as directed  Begin prednisone every morning with food for 5 days this is to reduce inflammation to to the upper airways which may be contributing to persistent symptoms  In use of cetirizine daily, this medicine helps to reduce the amount of mucus produced by the body  Try to increase your fluid intake through use of water which will help keep secretions.  You may use a humidifier at bedtime to help moisten the upper airways which may help minimize congestion and coughing, if you do not have a humidifier you may steam your bathroom and sit inside for 5 to 10 minutes prior to bed  Symptoms continue to persist you may follow-up with this urgent care or your primary doctor for reevaluation as needed

## 2023-01-08 IMAGING — DX DG CHEST 2V
2 series · 2 of 2 positions shown · non-contrast
Comparison: None Available.

CLINICAL DATA: Cough.

EXAM:
CHEST - 2 VIEW

[chest pa]
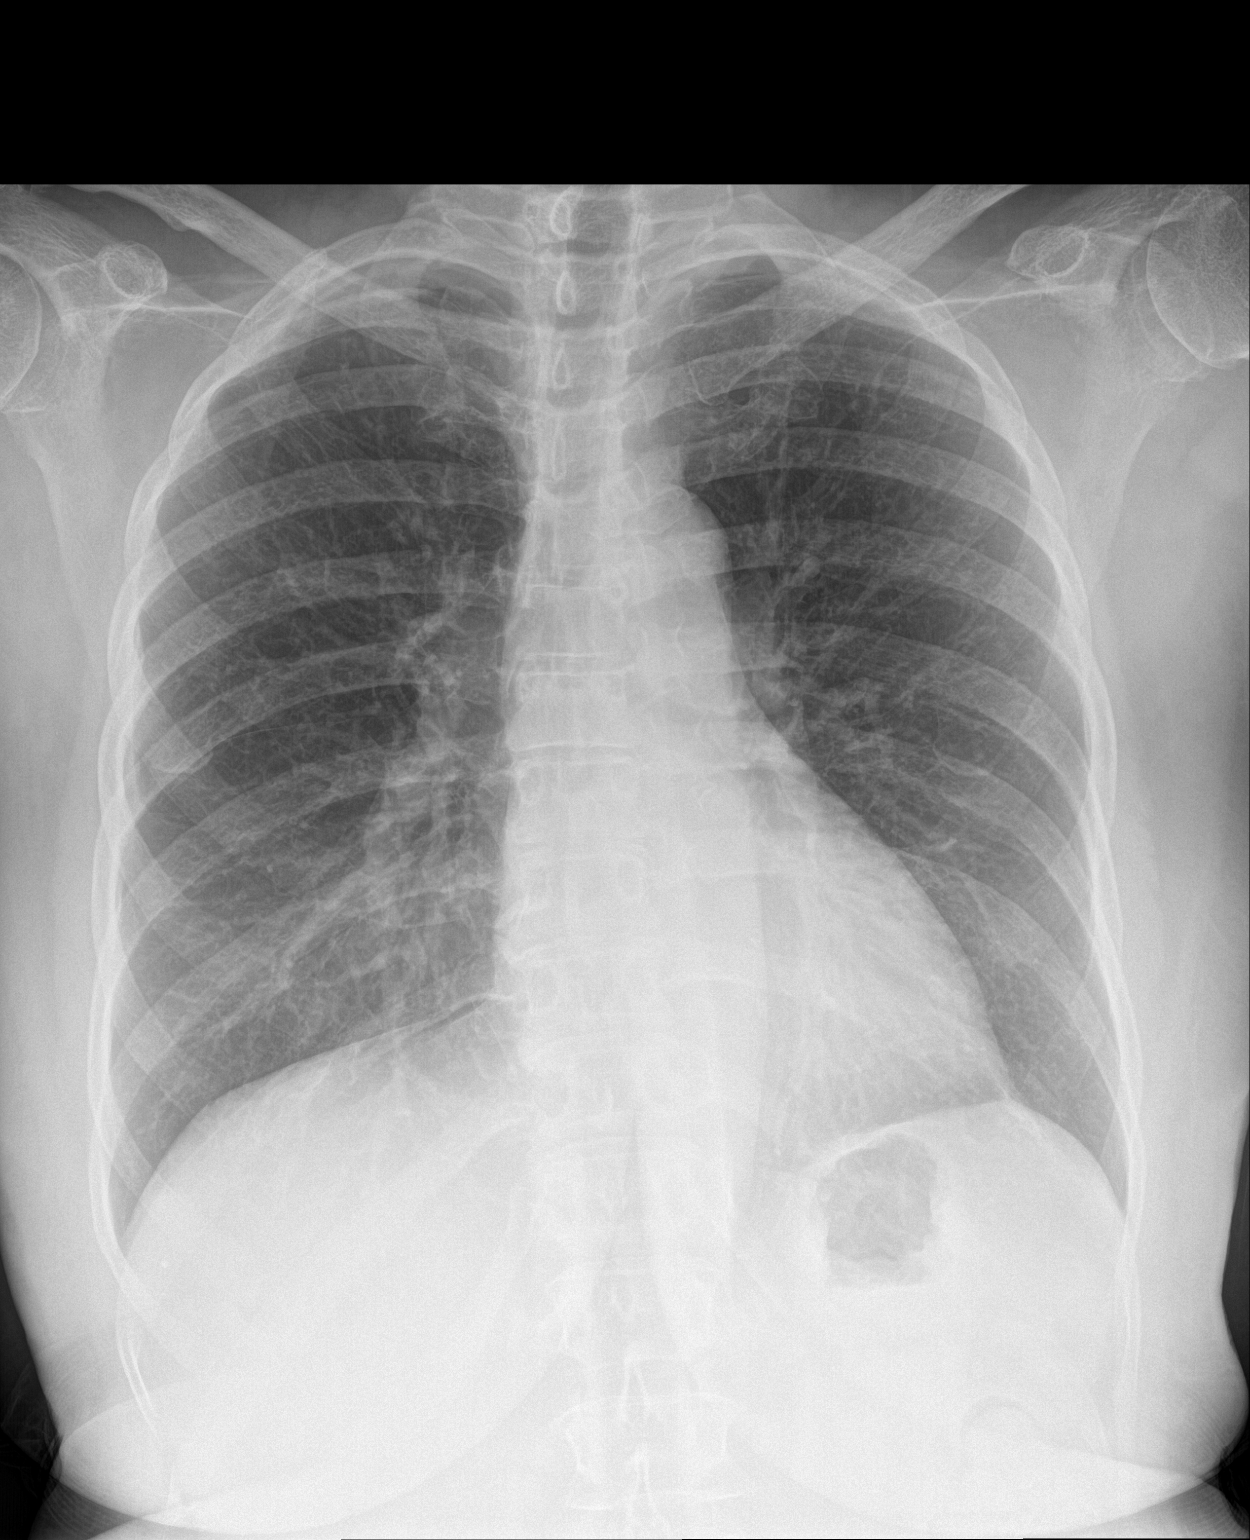

[chest lat]
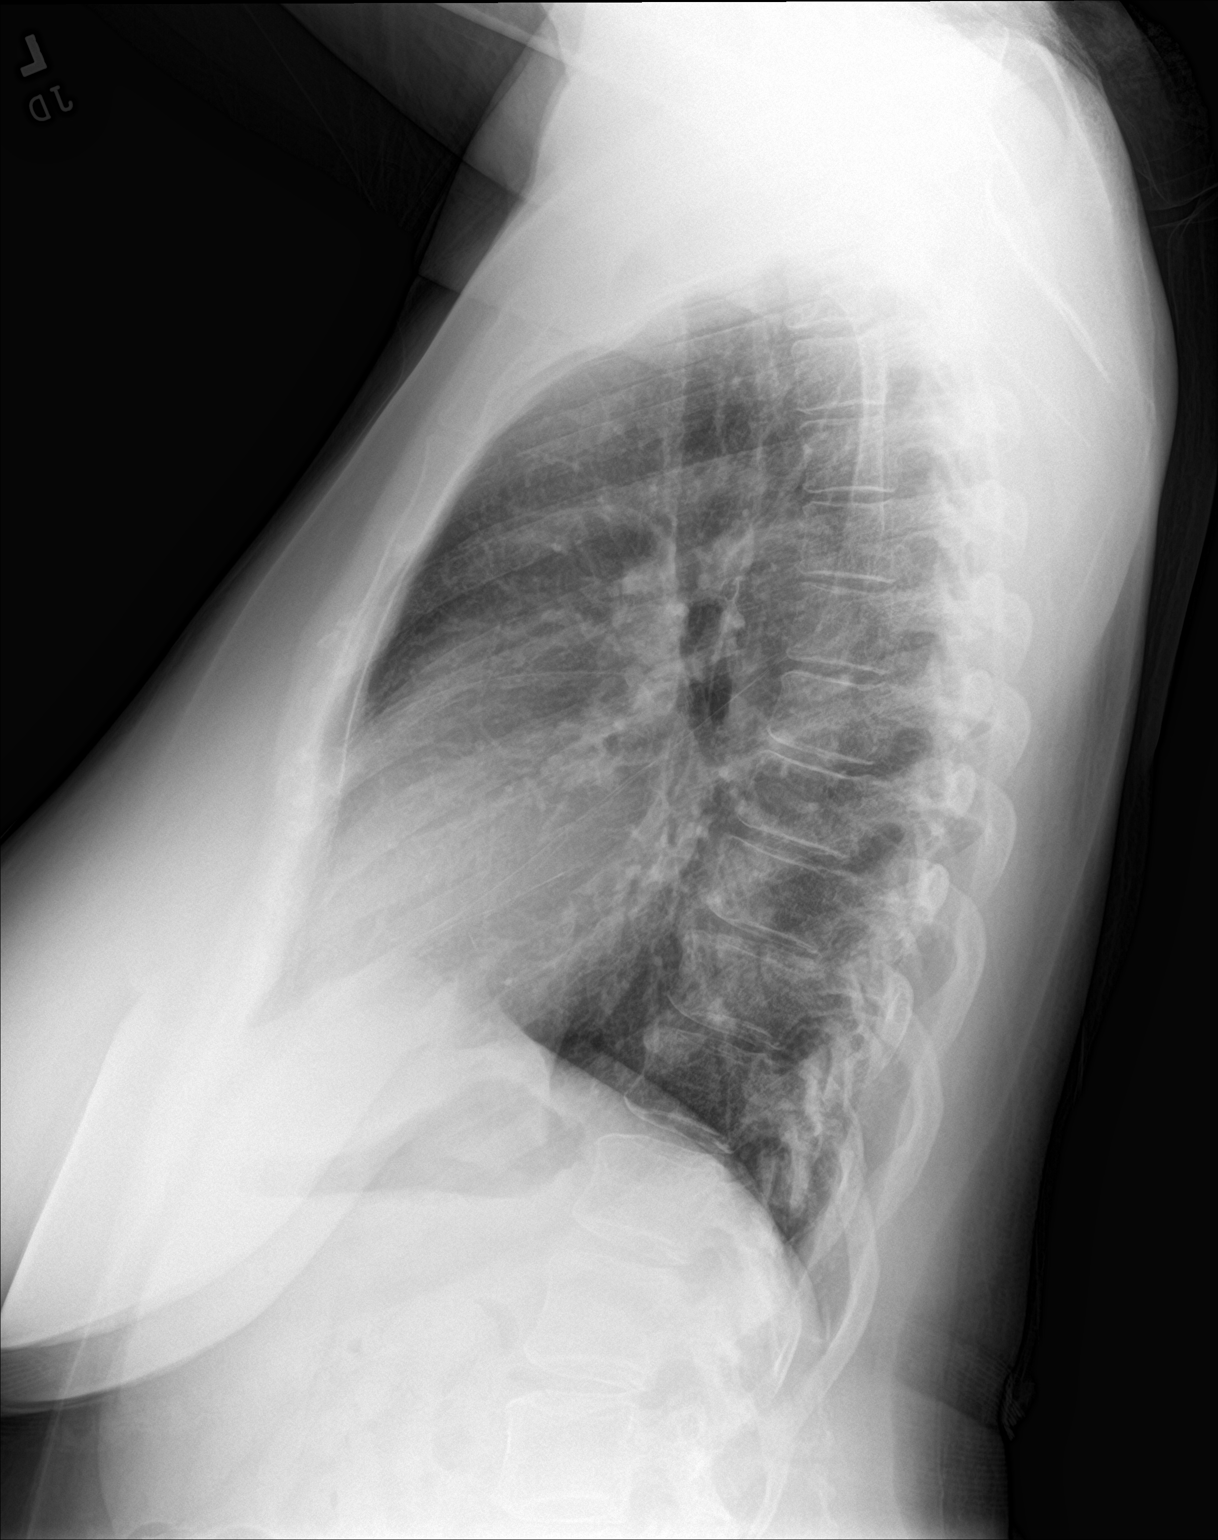

[2 of 2 positions shown; findings below may reference images not displayed]

FINDINGS: No consolidation. No visible pleural effusions or pneumothorax.
Biapical pleuroparenchymal scarring. Cardiomediastinal silhouette is
within normal limits. No displaced fracture.
IMPRESSION: No evidence of acute cardiopulmonary disease.

## 2024-06-21 NOTE — H&P (Addendum)
 Admission History & Physical  Name: Monica Austin DOB / AGE: 1956-01-24 / 68 y.o. MRN: [id number]  CSN: [id number] Date of Admission: 06/21/2024   PCP: NO PCP   Assessment    Rule out stroke HTN uncontrolled Dmt2 HLD  Left sided facial numbness and droop  Slurred speech   Cerebrovascular accident (CVA), unspecified mechanism (HCC) (POA: Yes)        Plan  Consult Neuro Neuro pcu Mri brain  2d echo  Aspirin statin  Lipid panel  Monitor labs and vitals Glycemic mangement Pt/ot/st Neuro checks Permissive HTN Review home meds.   [START ON 06/22/2024] aspirin, 325 mg, Oral, Daily  Or [START ON 06/22/2024] aspirin, 300 mg, Rectal, Daily atorvastatin, 80 mg, Oral, Daily enoxaparin, 40 mg, Subcutaneous, q24h SCH iopamidol, 100 mL, Intravenous, Once in imaging sodium chloride , , Intravenous, q12h [facility]     PRN medications: glucose **OR** glucose, Insert peripheral IV **AND** [COMPLETED] Saline lock IV **AND** sodium chloride  **AND** sodium chloride     Subjective  Chief Complaint Chief Complaint  Patient presents with  . Stroke    Per EMS pt [facility] with a chief complaint of left side facial droop and slurred speech last seen normal last night at 2300. Informant is daughter Delaine [phone]    History of Present Illness  68 year old female with a history of hypertension and diabetes presented to the ED via EMS for left facial droop and slurred speech, first noted upon awakening at 9 AM. Last known well was 11 PM the night prior. She initially went to urgent care, where she experienced urinary incontinence. EMS was activated, and her blood pressure was elevated at 216/66. She denies trauma, headache, seizure, visual changes, chest pain, shortness of breath, fever, or other systemic symptoms. Neurologic exam revealed left facial weakness and dysarthria, concerning for a right hemispheric event.    Past Medical History:  Diagnosis Date  .  Diabetes mellitus (HCC)   . Hyperlipidemia   . Hypertension     History reviewed. No pertinent surgical history.  No family history on file.  Social History   Socioeconomic History  . Marital status: Single    Spouse name: Not on file  . Number of children: Not on file  . Years of education: Not on file  . Highest education level: Not on file  Occupational History  . Not on file  Tobacco Use  . Smoking status: Not on file  . Smokeless tobacco: Not on file  Substance and Sexual Activity  . Alcohol use: Not on file  . Drug use: Not on file  . Sexual activity: Not on file  Other Topics Concern  . Not on file  Social History Narrative  . Not on file   Social Drivers of Health   Financial Resource Strain: Not on file  Food Insecurity: Not on file  Transportation Needs: Not on file  Physical Activity: Not on file  Stress: Not on file  Social Connections: Not on file  Intimate Partner Violence: Not on file  Housing Stability: Not on file    Allergies[1]  Review of Systems 10 point ROS was done, pertinent findings above, otherwise negative.   Objective  Physical Exam Vital Signs   06/21/24 1146 06/21/24 1240 06/21/24 1427 06/21/24 1550  BP: (!) 216/106 (!) 157/92 (!) 150/95 (!) 181/95  BP Location: Right arm Right arm Right arm Right arm  Patient Position: Sitting Sitting Sitting Sitting  Pulse: 87 70 55 69  Resp:  18 16 16 18   Temp: 36.2 C (97.2 F) 36.8 C (98.3 F)  36.7 C (98.1 F)  SpO2: 99% 96% 97% 99%  Weight: 86.7 kg (191 lb 2.2 oz)     Height: 1.702 m (5' 7)      Body mass index is 29.94 kg/m. Patient Lines/Drains/Airways Status     Active LDAs     Name Placement date Placement time Site Days   Peripheral IV 06/21/24 Left Antecubital 06/21/24  1139  Antecubital  less than 1                  Physical Exam   General:  Alert and oriented, No acute distress.   Eye:  Extraocular movements are intact, Normal conjunctiva.   HENT:   Normocephalic, Normal hearing.   Neck:  Supple, No carotid bruit.   Respiratory:  Lungs are clear to auscultation, Respirations are non-labored.   Cardiovascular:  Normal rate, Regular rhythm.   Gastrointestinal:  Soft, Non-tender, Non-distended, Normal bowel sounds.   Musculoskeletal:  Normal range of motion, Normal strength, No swelling, No deformity.   Integumentary:  Warm, No pallor, No rash.   Neurologic:  Alert, Oriented, Cranial Nerves: Facial asymmetry  Left facial droop, slurred speech    Psychiatric:  Cooperative, Appropriate mood & affect.    Labs/Studies Recent Results (from the past 24 hours)  CBC Auto Diff, Reflex Manual Diff if Indicated   Collection Time: 06/21/24 11:38 AM  Result Value   WBC 7.21   RBC 4.65   Hemoglobin 12.8   Hematocrit 38.2   MCV 82.2   MCH 27.5   MCHC 33.5   RDW 13.2   Platelet Count 163   MPV 11.8   Neutrophils % 51.0   Lymphocytes % 39.5   Monocytes % 6.8   Eosinophils % 1.8   Basophils % 0.6   Neutrophils Absolute 3.68   Lymphocytes Absolute 2.85   Monocytes Absolute 0.49   Eosinophils Absolute 0.13   Basophil Absolute 0.04  Protime-INR   Collection Time: 06/21/24 11:38 AM  Result Value   Prothrombin Time 11.8   INR 0.83  APTT   Collection Time: 06/21/24 11:38 AM  Result Value   APTT 20.7 (L)  Comprehensive metabolic panel   Collection Time: 06/21/24 11:38 AM  Result Value   Sodium 138   Potassium 4.5   Chloride 101   Carbon Dioxide 24.0   Anion Gap 13   BUN 12.0   Creatinine 0.75   Glucose 251 (H)   Calcium 9.6   AST 18   ALT 20   Alkaline Phosphatase 102   Protein, Total 7.5   Albumin 4.10   Globulin 3.4   A/G Ratio 1.2   Bilirubin, Total 0.30   eGFR 86.3  Troponin T Quant High Sensitivity Initial   Collection Time: 06/21/24 11:38 AM  Result Value   Troponin T, High Sensitivity 14  Lipid Panel   Collection Time: 06/21/24 11:38 AM  Result Value   Triglycerides 112   Cholesterol, Total 154.00   HDL  Cholesterol 60.00   LDL Cholesterol, Calc 71.6   Chol/HDL Ratio 2.6   LDL/HDL Ratio 1.2   VLDL, Calculated 22   Non-HDL Cholesterol 94  POC Glucose   Collection Time: 06/21/24 11:40 AM  Result Value   Glucose, POC 258 (H)  Urinalysis with reflex microscopic and reflex culture   Collection Time: 06/21/24 12:11 PM  Result Value   Color, Urine Straw   Clarity, Urine Clear  Leukocyte Esterase, Urine Negative   Nitrite, Urine Negative   Urobilinogen, Urine Normal   Protein, Qual, Urine Negative   pH, Urine 7.0   Blood, Urine Negative   Specific Gravity, Urine <1.005 (L)   Ketones, Urine Negative   Bilirubin, Urine Negative   Glucose, Qual, Urine 1+ (A)   Urinalysis Comment   Type and screen   Collection Time: 06/21/24 12:14 PM  Result Value   ABO Rh O POS   Antibody Screen NEG   Electronic Crossmatch Eligibility Y   Specimen Expiration Date 06/24/2024 23:59  Troponin T Quant High Sensitivity 1 Hour   Collection Time: 06/21/24 12:14 PM  Result Value   Troponin T, High Sensitivity 15 (H)  ABORH Confirm   Collection Time: 06/21/24 12:49 PM  Result Value   ABO Rh O POS  Troponin T Quant High Sensitivity 3 Hour   Collection Time: 06/21/24  2:21 PM  Result Value   Troponin T, High Sensitivity 14  POC Glucose   Collection Time: 06/21/24  3:31 PM  Result Value   Glucose, POC 214 (H)   XR Chest 1 View Result Date: 06/21/2024  No acute consolidation or effusion Created by: Alfonso Cohn, MD Signed by: Alfonso Cohn, MD Signed on: 06/21/2024 12:40 EDT Location: [id number]      CT Angio Head Neck W IV Contrast Stroke Alert Addendum Date: 06/21/2024   CRITICAL RESULTS: The finding of negative for large vessel occlusion with multiple severe narrowing in the intracranial arteries was discussed with Dr. CINDERELLA WALLIE HALEY by Marven Riling, MD on 06/21/2024 12:27 EDT.  Created by: Marven Riling, MD Signed by: Marven Riling, MD Signed on: 06/21/2024 12:29 EDT Location: [id number]      Result Date:  06/21/2024  CTA HEAD:   1.  No large vessel occlusion.   2.  Multifocal moderate to severe narrowing in the bilateral posterior cerebral arteries. 3. Severe focal narrowing at the M2 branch of the left MCA. 4. Moderate focal narrowing in the distal M1 segment of the right MCA. 5. Focal severe narrowing versus fenestration along the A1 segment of the right ACA. 6. Multifocal moderate to severe narrowing in the right vertebral artery. 7. Severe focal narrowing in the midportion of the left vertebral artery.   CTA NECK:   1. No hemodynamically significant stenosis of the major arteries of the neck.   2. Diffuse enlargement of the thyroid gland with multiple small subcentimeter hypodense nodules. 3. Multilevel cervical spondylosis with moderate spinal canal narrowing at C3-C4, C4-C5 and C5-C6 levels.   Created by: Marven Riling, MD Signed by: Marven Riling, MD Signed on: 06/21/2024 12:21 EDT Location: [id number]      CT Head WO IV Contrast Stroke Alert Addendum Date: 06/21/2024   CRITICAL RESULTS: The finding of negative for acute intracranial abnormalities was discussed with Dr. NORLEEN FLOCK by Marven Riling, MD on 06/21/2024 12:09 EDT.  Created by: Marven Riling, MD Signed by: Marven Riling, MD Signed on: 06/21/2024 12:11 EDT Location: [id number]      Result Date: 06/21/2024    1.  No acute intracranial abnormalities. No hemorrhage or CT evidence for acute large vessel infarction.   2.  Encephalomalacia in the left occipital lobe. Remote lacunar infarcts in bilateral basal ganglia and left thalamus. 3. Chronic small vessel ischemic changes and global parenchymal volume loss. 4. Mild effacement of cerebral sulci at the vertex. Clinical correlation for normal pressure hydrocephalus is recommended.   ASPECT SCORE: 10 Created by: Marven Riling, MD Signed by:  Marven Riling, MD Signed on: 06/21/2024 12:04 EDT Location: [id number]      MICRO RESULTS LAST 21 DAYS     ** No results found for the last 504 hours. **       Dietary Orders (From  admission, onward)     Start     Ordered   06/21/24 1320  NPO diet  Diet effective now       Comments: NPO and do not administer oral medications until Bedside Swallow Screening completed.   If failed Bedside Swallow Screen, keep NPO and do not administer oral medications, and order Consult SLP eval and treat.   06/21/24 1320                   [1] No Known Allergies

## 2024-06-22 NOTE — Nursing Note (Signed)
 Patient left AMA  with son ,driving back to [place] , Dr. Laroy and Dr. Jenness talked to the family the risks and recommneded to take patient straight to ED.

## 2024-06-22 NOTE — Progress Notes (Signed)
 Inpatient Occupational Therapy Evaluation  Name: Monica Austin DOB / AGE: 1956/01/07 / 68 y.o. MRN: [id number]  CSN: [id number]  Documentation Type: Initial Evaluation Subjective Patient Complaints: Fatigue, Other (comment), Weakness, Numbness/tingling (slurred speech, numb on L side) Patient Subjective Comments: pt agreed to OT Patient / Family Stated Goals: to go home Pain Adequately Managed for Therapy Participation: Pain management adequate throughout therapy session      Cerebrovascular accident (CVA), unspecified mechanism (HCC) (POA: Yes)  Medical History[1] Surgical History[2] History / Personal Factors Significant Hospital Course Details: 68 year old female with a history of hypertension and diabetes presented to the ED via EMS for left facial droop and slurred speech, first noted upon awakening at 9 AM.  CT Brain showed multiple chronic ischemic infarct. MRI brain showed Acute right cerebral infarct. (OT 6/29) Personal Factors/Hx Related to Presenting Therapy Problem(s): Medical instability, Physical deficits, Precautions / weight bearing restrictions, Social / living situation impacts discharge  Precautions Rehab Precautions/Restrictions: Yes Medical Precautions: Fall Risk  Home Living Type of Residence: Private residence Type of Home: House/Townhouse/Condo Lives with: Adult children Home Layout: One level Home Access: Stairs to enter with rails Entrance Stairs-Rails: Both Secretary/administrator of Steps: 8 Bathroom Shower/Tub: Engineer, manufacturing systems: Comfort height Bathroom Equipment: Grab bars in shower Home Living Comments: Patient visiting from [place] with family.  Prior Function Level of Independence: Independent functional transfers, Independent with ADLs Receives Help From: Family ADL Assistance: Independent Homemaking Assistance: Independent Employment Status: Full-time Type of Occupation: desk job Prior Function Comments: 6 recent falls  leading up to admission. pt splits time between living up [place] with daughter and living alone in [place] near son. Pt wishes to return to her home in [place]    Objective Family/Caregiver Present: Yes Nursing Pre-Treatment Communication: RN cleared for OT Post Treatment Communication: RN handoff completed, pt left with call bell and all needs met in bed with bed alarm on Activity Tolerance Endurance: Tolerates 10 - 20 min exercise with multiple rests Early Mobility/Exercise Safety Screen: Proceed with mobilization - No exclusion criteria met Activity Tolerance Comments: VSS on RA BP 169/94 at start of session  Cognition Overall Cognitive Status: WFL except Arousal / Alertness: Awake, Alert Orientation Level: Oriented to person, Oriented to place Following Commands: Follows one step commands with repetition, Follows one step commands with increased time Safety Judgment: Decreased awareness of need for safety, Decreased awareness of need for assistance Awareness of Errors: Assistance required to identify errors made, Assistance required to correct errors made Deficit Awareness: Decreased awareness of deficits Attention Span: Attends with cues to redirect Problem Solving: Assistance required to identify errors made, Assistance required to generate solutions Cognition Comments: mild impulsivity  Vision/Hearing Hearing: Appears at baseline/premorbid status Current Vision: Appears at baseline/premorbid status Sensation Light Touch: Partial deficits in the LUE Proprioception Proprioception: Partial deficits in the LUE  Coordination Movements Are Fluid and Coordinated: Yes Hand Function Dominant UE/Hand: Right Gross Grasp:  (LUE grasp 3/5, RUE 5/5) Coordination: Impaired (fair (-) FMC with LUE, funtional tasks with LUE also limited by sensation and proprioception deficits) Static Sitting Balance Static Sitting Balance Grade: Good-/ Fair+: Accepts minimal resistance Dynamic Sitting  Balance Dynamic Sitting Balance Grade: Fair: Able to sit unsupported & minimally weight shift to same side/front, difficulty crossing midline Static Standing Balance Static Standing Balance Grade: Fair: Able to stand w/o UE support & w/o loss of balance for 1-2 minutes Dynamic Standing Balance Dynamic Standing Balance Grade: Fair- : Able to stand unsupported & reach to same side,  unable to shift weight Dynamic Standing-Comments: FWW  Activities of Daily Living: Premorbid Modified Rankin Score: No significant disability.  Able to carry out all usual activities, despite some symptoms.  Grooming Grooming Assistance: Moderate assist Grooming Deficit: Wash/dry face, Increased time to complete, Setup, Supervision/safety, Teeth care, Wash/dry hands Grooming Comments: grooming task standing sinkside with assist for stedaying, for [facility] with ADL tool management and task thoughness  Upper Body Dressing UB Dressing Assistance: Minimal assist UB Dressing Deficit: Pull down in back, Increased time to complete, Pull around back, Supervision/safety UB Dressing Comments: for simulated don/doffing pull over shirt seated Lower Body Dressing LB Dressing Assistance: Moderate assist Sock Assistance: Moderate assist LB Dressing Deficit: Don/doff L sock, Don/doff R sock, Supervision/safety, Increased time to complete, Setup, Thread RLE into underwear, Thread LLE into underwear, Pull up over hips LB Dressing Comments: pt able to lift legs while seated to assist with LBD with VC to use this method. increased assist for inital threading and assist for pulling up pants on L side over hips Toilet Transfers Toilet Transfer From: Bed Toilet Transfer Type: To and from Toilet Transfer to: Other (comment) (standing sinkside) Toilet Transfer Technique: Ambulating, Art gallery manager Transfers Assistance: Minimal Engineer, manufacturing Deficit: Increased time to complete, Supervision/safety, Verbal cueing, Steadying,  Setup Toilet Transfers Comments: to progress toilet transfer skills with max VC/TC for safe use of FWW ADL Related Mobility Bed Mobility Assist: Minimal assist Bed Mobility Comments: supine<sit Transfer Assist: Minimal assist Transfer Comments: STS and funtional mobility with FWW    Extremity Assessments: RUE Assessment: Within Functional Limits  LUE Assessment: WFL except (gross stength3-/5)    AM-PAC 6-Clicks Daily Activity IP Short Form Eating Meals?: A Little Personal Grooming?: A Little Upper Extremity Clothing?: A Little Lower Extremity Clothing?: A Lot Toileting?: A Lot Bathing?: A Lot AM-PAC Daily Activity Raw Score: 15 AM-PAC Daily Activity Standardized T-Scale Score: 34.69  IP OT Additional Assessments:  Treatment Activities    ADL/Self Care Mgt ADL/Self Care Activity 1: grooming task x3 standing sinkside with frequent droping of items with LUE and assist for ADL tool management, task thoughness and steading ADL/Self Care Activity 2: simulated UBD ADL/Self Care Activity 3: LBD task x2 ADL/Self Care Activity 4: simulated toilet transfer with FWW and max VC for safety Therapeutic Activity Therapeutic Activity 1: fall prevention and safety awareness within the context of ADLs Therapeutic Activity 2: ECT with ADLs Therapeutic Activity 3: edu on importance of daily exercise and siting up throughout the day Therapeutic Activity 4: edu on role of OT and DC planning Other Activity Other Activity 1: Pt with increased medical complexity and decreased activity tolerance. co-treated with PT to maximize pt's participation, safety and funtional gains IP OT Evaluation Minutes OT Evaluation (Moderate Complexity) Minutes: 5 IP OT Therapeutic Interventions Minutes Self Care/Home Management (ADLs) Minutes: 30 Therapeutic Activity Minutes: 8    Assessment Pt presents with LUE weakness, proprioceptive and sensation deficits along with decreased FMC, balance, safety awareness and  activity tolerance effecting ADL participation/performance. Pt would benefit from further skilled acute OT to address deficits listed below to maximize ADL independence and safety for DC.     Impairments Noted (OT): ADLs, Balance, Endurance, Functional mobility, Safety awareness, Fine motor control, Upper extremity strength Evaluation/Treatment Tolerance: Tolerated treatment well Motivation/Participation Level: Engaged Patient's Support Structure: Fair Barriers to Discharge: Requires more physical assistance than can be provided at prior setting, Prior living setting not accessible based on current functional status (pt lives alone)    Goals Therapy  Goals Set With: Patient, Family OT Target Goal Date: 07/06/24 Overall Progress Toward OT Goals: Goals reviewed-remain appropriate Grooming Goal 1 (OT) Activity/Device: grooming skills Independence Level/Cues Needed: modified independent Strategies/Barriers: grooming tasks for ~5 min standing with no dropping of ADL tools Dressing Goal 1 (OT) Activity/Device: lower body dressing Independence/Cues Needed: modified independent Strategies/Barriers: seated with LRAD Dressing Goal 2 (OT) Activity/Device: upper body dressing Independence/Cues Needed: modified independent Strategies/Barriers: seated Transfer Goal 1 (OT) Activity/Assistive Device: toilet Independence Level/Cues Needed: modified independent Strategies/Barriers: with LRAD  Strength Goal 1 (OT) Strength Goal: pt will demo and verbilize understanding of LUE HEP to maximize LUE strength, coordination, and joint integrity for ADL participation. Safety Awareness Goal 1 (OT) Activity: awareness of need for assistance, safe use of assistive device/equipment, demonstrates compliance, demonstrates understanding Independence/Cues/Accuracy: independent Strategies/Barriers: during ADLs        OT Evaluation Complexity Occupational Profile/History: Expanded review of medical &/or  therapy records & add'l review of physical, cognitive or psychosocial hx related to current functional performance. Assessment Deficits: 3-5 performance deficits (ie physical, cognitive or psychosocial) resulting in activity limitations &/or restrictions Clinical Decision Making: Moderate Calculated Eval Type: 2 Charting Based Suggested Eval Complexity: Moderate Complexity    Plan OT Plan: Continue skilled OT OT Treatment Activities/Interventions: ADLs, Adaptive equipment, Balance, Dressing, Functional mobility, Grooming, Therapeutic exercise, Toilet transfers, Therapeutic activities, Neuromuscular re-education, Coordination OT Treatment Focus: Body mechanics, Education-patient, Energy conservation, Home assessment/modification, Home management, Safety, Therapeutic handling/positioning, Joint protection, Relaxation techniques, Core stabilization, Education-caregiver, Fine motor skills, Home exercise program, Posture OT Frequency: 5x/wk    Discharge Dispo/Follow-up Recommendations: Inpatient rehab facility Post D/C Follow-up Therapy Service Recommendations: Occupational Therapy D/C Home Planning: needs to be mod I with ADLs and funtional mobility for safe DC home alone back to [facility] Adaptive Equipment Recommended for D/C: Bedside commode 3-in-1 Functional Mobility Equipment Recommended for D/C: Walker - front wheel     Occupational Therapy Evaluation      [1] Past Medical History: Diagnosis Date  . Diabetes mellitus (HCC)   . Hyperlipidemia   . Hypertension   [2] History reviewed. No pertinent surgical history.

## 2024-06-23 ENCOUNTER — Inpatient Hospital Stay (HOSPITAL_COMMUNITY)
Admission: EM | Admit: 2024-06-23 | Discharge: 2024-06-26 | DRG: 065 | Disposition: A | Discharge status: Rehabilitation Facility | Payer: Self-pay | Attending: Internal Medicine | Admitting: Internal Medicine

## 2024-06-23 ENCOUNTER — Encounter (HOSPITAL_COMMUNITY): Payer: Self-pay | Admitting: *Deleted

## 2024-06-23 ENCOUNTER — Other Ambulatory Visit: Payer: Self-pay

## 2024-06-23 ENCOUNTER — Observation Stay (HOSPITAL_COMMUNITY): Payer: Self-pay

## 2024-06-23 DIAGNOSIS — I10 Essential (primary) hypertension: Secondary | ICD-10-CM | POA: Diagnosis present

## 2024-06-23 DIAGNOSIS — N390 Urinary tract infection, site not specified: Secondary | ICD-10-CM | POA: Diagnosis present

## 2024-06-23 DIAGNOSIS — I639 Cerebral infarction, unspecified: Principal | ICD-10-CM | POA: Diagnosis present

## 2024-06-23 DIAGNOSIS — R1310 Dysphagia, unspecified: Secondary | ICD-10-CM | POA: Insufficient documentation

## 2024-06-23 DIAGNOSIS — E1165 Type 2 diabetes mellitus with hyperglycemia: Secondary | ICD-10-CM | POA: Diagnosis present

## 2024-06-23 DIAGNOSIS — Z91198 Patient's noncompliance with other medical treatment and regimen for other reason: Secondary | ICD-10-CM

## 2024-06-23 DIAGNOSIS — M47812 Spondylosis without myelopathy or radiculopathy, cervical region: Secondary | ICD-10-CM | POA: Diagnosis present

## 2024-06-23 DIAGNOSIS — I63511 Cerebral infarction due to unspecified occlusion or stenosis of right middle cerebral artery: Principal | ICD-10-CM | POA: Diagnosis present

## 2024-06-23 DIAGNOSIS — R1312 Dysphagia, oropharyngeal phase: Secondary | ICD-10-CM | POA: Diagnosis present

## 2024-06-23 DIAGNOSIS — Z79899 Other long term (current) drug therapy: Secondary | ICD-10-CM

## 2024-06-23 DIAGNOSIS — Z91148 Patient's other noncompliance with medication regimen for other reason: Secondary | ICD-10-CM

## 2024-06-23 DIAGNOSIS — Z83719 Family history of colon polyps, unspecified: Secondary | ICD-10-CM

## 2024-06-23 DIAGNOSIS — R29707 NIHSS score 7: Secondary | ICD-10-CM | POA: Diagnosis present

## 2024-06-23 DIAGNOSIS — R32 Unspecified urinary incontinence: Secondary | ICD-10-CM | POA: Diagnosis present

## 2024-06-23 DIAGNOSIS — R4781 Slurred speech: Secondary | ICD-10-CM | POA: Diagnosis present

## 2024-06-23 DIAGNOSIS — Z7984 Long term (current) use of oral hypoglycemic drugs: Secondary | ICD-10-CM

## 2024-06-23 DIAGNOSIS — R471 Dysarthria and anarthria: Secondary | ICD-10-CM | POA: Diagnosis present

## 2024-06-23 DIAGNOSIS — E785 Hyperlipidemia, unspecified: Secondary | ICD-10-CM | POA: Diagnosis present

## 2024-06-23 DIAGNOSIS — Z888 Allergy status to other drugs, medicaments and biological substances status: Secondary | ICD-10-CM

## 2024-06-23 DIAGNOSIS — N3000 Acute cystitis without hematuria: Secondary | ICD-10-CM

## 2024-06-23 DIAGNOSIS — J4520 Mild intermittent asthma, uncomplicated: Secondary | ICD-10-CM | POA: Diagnosis present

## 2024-06-23 DIAGNOSIS — Z7952 Long term (current) use of systemic steroids: Secondary | ICD-10-CM

## 2024-06-23 DIAGNOSIS — R29810 Facial weakness: Secondary | ICD-10-CM | POA: Diagnosis present

## 2024-06-23 DIAGNOSIS — B9620 Unspecified Escherichia coli [E. coli] as the cause of diseases classified elsewhere: Secondary | ICD-10-CM | POA: Diagnosis present

## 2024-06-23 DIAGNOSIS — G8194 Hemiplegia, unspecified affecting left nondominant side: Secondary | ICD-10-CM | POA: Diagnosis present

## 2024-06-23 DIAGNOSIS — Z825 Family history of asthma and other chronic lower respiratory diseases: Secondary | ICD-10-CM

## 2024-06-23 DIAGNOSIS — I6389 Other cerebral infarction: Secondary | ICD-10-CM | POA: Diagnosis present

## 2024-06-23 HISTORY — DX: Hyperlipidemia, unspecified: E78.5

## 2024-06-23 HISTORY — DX: Diverticulosis of intestine, part unspecified, without perforation or abscess without bleeding: K57.90

## 2024-06-23 HISTORY — DX: Essential (primary) hypertension: I10

## 2024-06-23 LAB — COMPREHENSIVE METABOLIC PANEL WITH GFR
ALT: 20 U/L (ref 0–44)
AST: 21 U/L (ref 15–41)
Albumin: 3.8 g/dL (ref 3.5–5.0)
Alkaline Phosphatase: 72 U/L (ref 38–126)
Anion gap: 10 (ref 5–15)
BUN: 16 mg/dL (ref 8–23)
CO2: 24 mmol/L (ref 22–32)
Calcium: 9.6 mg/dL (ref 8.9–10.3)
Chloride: 103 mmol/L (ref 98–111)
Creatinine, Ser: 1.01 mg/dL — ABNORMAL HIGH (ref 0.44–1.00)
GFR, Estimated: >60 mL/min (ref >60)
Glucose, Bld: 253 mg/dL — ABNORMAL HIGH (ref 70–99)
Potassium: 3.8 mmol/L (ref 3.5–5.1)
Sodium: 137 mmol/L (ref 135–145)
Total Bilirubin: 0.5 mg/dL (ref 0.0–1.2)
Total Protein: 7.4 g/dL (ref 6.5–8.1)

## 2024-06-23 LAB — PROTIME-INR
INR: 0.9 (ref 0.8–1.2)
Prothrombin Time: 13.2 s (ref 11.4–15.2)

## 2024-06-23 LAB — CBC WITH DIFFERENTIAL/PLATELET
Abs Immature Granulocytes: 0.02 10*3/uL (ref 0.00–0.07)
Basophils Absolute: 0 10*3/uL (ref 0.0–0.1)
Basophils Relative: 1 %
Eosinophils Absolute: 0.2 10*3/uL (ref 0.0–0.5)
Eosinophils Relative: 2 %
HCT: 39.3 % (ref 36.0–46.0)
Hemoglobin: 13.4 g/dL (ref 12.0–15.0)
Immature Granulocytes: 0 %
Lymphocytes Relative: 41 %
Lymphs Abs: 3.3 10*3/uL (ref 0.7–4.0)
MCH: 28.4 pg (ref 26.0–34.0)
MCHC: 34.1 g/dL (ref 30.0–36.0)
MCV: 83.3 fL (ref 80.0–100.0)
Monocytes Absolute: 0.5 10*3/uL (ref 0.1–1.0)
Monocytes Relative: 6 %
Neutro Abs: 3.9 10*3/uL (ref 1.7–7.7)
Neutrophils Relative %: 50 %
Platelets: 246 10*3/uL (ref 150–400)
RBC: 4.72 MIL/uL (ref 3.87–5.11)
RDW: 13.2 % (ref 11.5–15.5)
WBC: 7.9 10*3/uL (ref 4.0–10.5)
nRBC: 0 % (ref 0.0–0.2)

## 2024-06-23 LAB — URINALYSIS, ROUTINE W REFLEX MICROSCOPIC
Bilirubin Urine: NEGATIVE
Glucose, UA: NEGATIVE mg/dL
Hgb urine dipstick: NEGATIVE
Ketones, ur: NEGATIVE mg/dL
Leukocytes,Ua: NEGATIVE
Nitrite: POSITIVE — AB
Protein, ur: NEGATIVE mg/dL
Specific Gravity, Urine: 1.019 (ref 1.005–1.030)
pH: 5 (ref 5.0–8.0)

## 2024-06-23 LAB — HEMOGLOBIN A1C
Hgb A1c MFr Bld: 9.7 % — ABNORMAL HIGH (ref 4.8–5.6)
Mean Plasma Glucose: 231.69 mg/dL

## 2024-06-23 LAB — CBG MONITORING, ED
Glucose-Capillary: 237 mg/dL — ABNORMAL HIGH (ref 70–99)
Glucose-Capillary: 200 mg/dL — ABNORMAL HIGH (ref 70–99)

## 2024-06-23 LAB — HIV ANTIBODY (ROUTINE TESTING W REFLEX): HIV Screen 4th Generation wRfx: NONREACTIVE

## 2024-06-23 LAB — GLUCOSE, CAPILLARY
Glucose-Capillary: 185 mg/dL — ABNORMAL HIGH (ref 70–99)
Glucose-Capillary: 271 mg/dL — ABNORMAL HIGH (ref 70–99)

## 2024-06-23 MED ORDER — CLOPIDOGREL BISULFATE 75 MG PO TABS
75.0000 mg | ORAL_TABLET | Freq: Every day | ORAL | Status: DC
  Administered 2024-06-23 – 2024-06-26 (×4): 75 mg via ORAL
  Filled 2024-06-23 (×4): qty 1

## 2024-06-23 MED ORDER — ENOXAPARIN SODIUM 40 MG/0.4ML IJ SOSY
40.0000 mg | PREFILLED_SYRINGE | INTRAMUSCULAR | Status: DC
  Administered 2024-06-23 – 2024-06-26 (×4): 40 mg via SUBCUTANEOUS
  Filled 2024-06-23 (×4): qty 0.4

## 2024-06-23 MED ORDER — MONTELUKAST SODIUM 10 MG PO TABS
10.0000 mg | ORAL_TABLET | Freq: Every day | ORAL | Status: DC
  Administered 2024-06-23 – 2024-06-25 (×3): 10 mg via ORAL
  Filled 2024-06-23 (×3): qty 1

## 2024-06-23 MED ORDER — ACETAMINOPHEN 325 MG PO TABS
650.0000 mg | ORAL_TABLET | Freq: Four times a day (QID) | ORAL | Status: DC | PRN
  Administered 2024-06-23 – 2024-06-25 (×3): 650 mg via ORAL
  Filled 2024-06-23 (×3): qty 2

## 2024-06-23 MED ORDER — ONDANSETRON HCL 4 MG/2ML IJ SOLN: 4.0000 mg | Freq: Four times a day (QID) | INTRAMUSCULAR | Status: DC | PRN

## 2024-06-23 MED ORDER — HYDRALAZINE HCL 20 MG/ML IJ SOLN: 10.0000 mg | INTRAMUSCULAR | Status: DC | PRN

## 2024-06-23 MED ORDER — STROKE: EARLY STAGES OF RECOVERY BOOK
Freq: Once | Status: AC
  Filled 2024-06-23: qty 1

## 2024-06-23 MED ORDER — INSULIN ASPART 100 UNIT/ML IJ SOLN
0.0000 [IU] | Freq: Three times a day (TID) | INTRAMUSCULAR | Status: DC
  Administered 2024-06-23: 5 [IU] via SUBCUTANEOUS
  Administered 2024-06-23 – 2024-06-24 (×4): 3 [IU] via SUBCUTANEOUS
  Administered 2024-06-24: 5 [IU] via SUBCUTANEOUS
  Administered 2024-06-25: 3 [IU] via SUBCUTANEOUS
  Administered 2024-06-25 – 2024-06-26 (×2): 5 [IU] via SUBCUTANEOUS

## 2024-06-23 MED ORDER — ONDANSETRON HCL 4 MG PO TABS: 4.0000 mg | ORAL_TABLET | Freq: Four times a day (QID) | ORAL | Status: DC | PRN

## 2024-06-23 MED ORDER — ASPIRIN 81 MG PO TBEC
81.0000 mg | DELAYED_RELEASE_TABLET | Freq: Every day | ORAL | Status: DC
  Administered 2024-06-23 – 2024-06-26 (×4): 81 mg via ORAL
  Filled 2024-06-23 (×4): qty 1

## 2024-06-23 MED ORDER — SODIUM CHLORIDE 0.9% FLUSH
3.0000 mL | Freq: Two times a day (BID) | INTRAVENOUS | Status: DC
  Administered 2024-06-23 – 2024-06-26 (×7): 3 mL via INTRAVENOUS

## 2024-06-23 MED ORDER — ALBUTEROL SULFATE (2.5 MG/3ML) 0.083% IN NEBU: 2.5000 mg | INHALATION_SOLUTION | Freq: Four times a day (QID) | RESPIRATORY_TRACT | Status: DC | PRN

## 2024-06-23 MED ORDER — ACETAMINOPHEN 650 MG RE SUPP: 650.0000 mg | Freq: Four times a day (QID) | RECTAL | Status: DC | PRN

## 2024-06-23 MED ORDER — METOPROLOL SUCCINATE ER 25 MG PO TB24
25.0000 mg | ORAL_TABLET | Freq: Every day | ORAL | Status: DC
Start: 2024-06-23 — End: 2024-06-26
  Administered 2024-06-23 – 2024-06-26 (×4): 25 mg via ORAL
  Filled 2024-06-23 (×4): qty 1

## 2024-06-23 MED ORDER — INSULIN GLARGINE-YFGN 100 UNIT/ML ~~LOC~~ SOLN
10.0000 [IU] | SUBCUTANEOUS | Status: DC
  Administered 2024-06-23 – 2024-06-24 (×2): 10 [IU] via SUBCUTANEOUS
  Filled 2024-06-23 (×4): qty 0.1

## 2024-06-23 MED ORDER — SODIUM CHLORIDE 0.9 % IV SOLN
1.0000 g | INTRAVENOUS | Status: DC
  Administered 2024-06-24 – 2024-06-25 (×2): 1 g via INTRAVENOUS
  Filled 2024-06-23 (×2): qty 10

## 2024-06-23 MED ORDER — INSULIN ASPART 100 UNIT/ML IJ SOLN
0.0000 [IU] | Freq: Every day | INTRAMUSCULAR | Status: DC
  Administered 2024-06-23: 3 [IU] via SUBCUTANEOUS
  Administered 2024-06-24: 2 [IU] via SUBCUTANEOUS

## 2024-06-23 MED ORDER — ROSUVASTATIN CALCIUM 20 MG PO TABS
20.0000 mg | ORAL_TABLET | Freq: Every evening | ORAL | Status: DC
  Administered 2024-06-23 – 2024-06-25 (×3): 20 mg via ORAL
  Filled 2024-06-23 (×3): qty 1

## 2024-06-23 MED ORDER — SODIUM CHLORIDE 0.9 % IV SOLN
1.0000 g | Freq: Once | INTRAVENOUS | Status: AC
  Administered 2024-06-23: 1 g via INTRAVENOUS
  Filled 2024-06-23: qty 10

## 2024-06-23 NOTE — Evaluation (Signed)
 Occupational Therapy Evaluation Patient Details Name: Monica Austin MRN: [id number] DOB: [birth year] Today's Date: 06/23/2024   History of Present Illness   Pt is a 68 y.o. female presenting 6/30 with L sided weakness. Was on vacation on [place]  over the weekend for L weakness and diagnized with R basal ganglia stroke; left AMA to come home where her doctor in [place]  recommended she come directly to the hospital.. MRI brain demonstrates R basal ganglia infarct. CTA head/neck revealed multifocal intracranial atherosclerotic disease including moderate right M1 MCA stenosis and severe stenoses in other vessels. PMH significant for HTN, HLD, DMII.     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 Upon eval, pt presents with LUE weakness, decr coordination, initiation on L. Pt with exacerbated symptoms including decreased balance, increased anterior loss of saliva, and slurred speech with fatigue needing up to min A for balance to return to EOB. Pt son present and supportive throughout. Will continue to follow. Due to independent PTA, and significant change in functional status, as well as supportive family, recommending intensive inpatient rehabilitation after discharge.      If plan is discharge home, recommend the following:   A little help with walking and/or transfers;A little help with bathing/dressing/bathroom;Assistance with cooking/housework;Assist for transportation;Help with stairs or ramp for entrance;Direct supervision/assist for financial management;Direct supervision/assist for medications management     Functional Status Assessment   Patient has had a recent decline in their functional status and demonstrates the ability to make significant improvements in function in a reasonable and predictable amount of time.     Equipment  Recommendations   Other (comment) (defer)     Recommendations for Other Services   Rehab consult;PT consult;Speech consult     Precautions/Restrictions   Precautions Precautions: Fall Restrictions Weight Bearing Restrictions Per Provider Order: No     Mobility Bed Mobility Overal bed mobility: Needs Assistance Bed Mobility: Supine to Sit, Sit to Supine     Supine to sit: Min assist Sit to supine: Supervision        Transfers Overall transfer level: Needs assistance Equipment used: None Transfers: Sit to/from Stand Sit to Stand: Contact guard assist           General transfer comment: for safety      Balance Overall balance assessment: Needs assistance Sitting-balance support: No upper extremity supported, Feet supported Sitting balance-Leahy Scale: Good Sitting balance - Comments: statically   Standing balance support: No upper extremity supported, Bilateral upper extremity supported, During functional activity Standing balance-Leahy Scale: Poor Standing balance comment: CGA on intial static stand; but with fatigue needs up to min A                           ADL either performed or assessed with clinical judgement   ADL Overall ADL's : Needs assistance/impaired   Eating/Feeding Details (indicate cue type and reason): encouraged NPO until speech eval, but food already in room on OT arrival. pt reports she has been coughing after attempts to eat. SLP notified and going to her room right after OT session. Pt did not eat during OT session Grooming: Standing;Minimal assistance   Upper Body Bathing: Minimal assistance;Sitting   Lower Body Bathing: Minimal assistance;Sit to/from stand   Upper Body Dressing : Minimal assistance;Sitting   Lower Body Dressing: Minimal assistance;Sit to/from stand  Toilet Transfer: Minimal assistance;Ambulation;Rolling walker (2 wheels) Toilet Transfer Details (indicate cue type and reason): CGA progressing to  min A         Functional mobility during ADLs: Minimal assistance;Rolling walker (2 wheels)       Vision Baseline Vision/History: 0 No visual deficits Ability to See in Adequate Light: 0 Adequate Patient Visual Report: No change from baseline Vision Assessment?: No apparent visual deficits     Perception         Praxis Praxis: Impaired Praxis Impairment Details: Initiation, Motor planning (decr initiation on L as compared to R. Increaed time for motor planning)     Pertinent Vitals/Pain Pain Assessment Pain Assessment: Faces Faces Pain Scale: No hurt     Extremity/Trunk Assessment Upper Extremity Assessment Upper Extremity Assessment: Right hand dominant;LUE deficits/detail LUE Deficits / Details: decr coordination speed as well as generally weak. 4-/5. poor initiation as compared to R. Decr attention to L side at times. LUE Sensation: decreased light touch;decreased proprioception LUE Coordination: decreased fine motor;decreased gross motor   Lower Extremity Assessment Lower Extremity Assessment: Defer to PT evaluation       Communication Communication Communication: Impaired Factors Affecting Communication: Reduced clarity of speech (questionable word finding difficulty as well)   Cognition Arousal: Alert Behavior During Therapy: WFL for tasks assessed/performed, Flat affect Cognition: Cognition impaired     Awareness: Intellectual awareness intact, Online awareness impaired (at times, has difficulty identifying when her deficits are being exacerbated with fatigue. But generally aware of deficits otherwise) Memory impairment (select all impairments): Short-term memory Attention impairment (select first level of impairment): Sustained attention, Selective attention Executive functioning impairment (select all impairments): Sequencing, Problem solving, Reasoning OT - Cognition Comments: pt with poor safety awareness at times, after provided RW, inconsistent use;  decr balance with fatigue but no self initiation of RW use, needing external cues to self-initiate rest break                 Following commands: Impaired Following commands impaired: Follows one step commands with increased time, Follows multi-step commands inconsistently     Cueing  General Comments   Cueing Techniques: Verbal cues;Tactile cues;Gestural cues      Exercises     Shoulder Instructions      Home Living Family/patient expects to be discharged to:: Private residence Living Arrangements: Other relatives (dog and grand son per report, but per later report pt most frequently stays with daughter in [place] ) Available Help at Discharge: Family;Available 24 hours/day Type of Home: House (has a house in [place], which her grandson also stays at, but she also goes to [place] for periods of time to work and she stays with her daughter while up there) Home Access: Stairs to enter Secretary/administrator of Steps: 6-7 Entrance Stairs-Rails: Right;Left Home Layout: One level;Laundry or work area in basement     [employer] Shower/Tub: Chief Strategy Officer: Standard (comfort)     Home Equipment: Grab bars - tub/shower;Hand held shower head      Lives With: Family    Prior Functioning/Environment Prior Level of Function : Independent/Modified Independent;Driving;Working/employed               ADLs Comments: works in Engineer, mining; predominantly desk/phone calls    OT Problem List: Decreased strength;Decreased activity tolerance;Impaired balance (sitting and/or standing);Decreased cognition;Decreased coordination;Decreased knowledge of use of DME or AE;Impaired UE functional use   OT Treatment/Interventions: Self-care/ADL training;Therapeutic exercise;DME and/or AE instruction;Neuromuscular education;Therapeutic activities;Cognitive remediation/compensation;Patient/family education;Balance training  OT Goals(Current goals  can be found in the care plan section)   Acute Rehab OT Goals Patient Stated Goal: get better OT Goal Formulation: With patient Time For Goal Achievement: 07/07/24 Potential to Achieve Goals: Good   OT Frequency:  Min 2X/week    Co-evaluation              AM-PAC OT 6 Clicks Daily Activity     Outcome Measure Help from another person eating meals?: A Little Help from another person taking care of personal grooming?: A Little Help from another person toileting, which includes using toliet, bedpan, or urinal?: A Little Help from another person bathing (including washing, rinsing, drying)?: A Little Help from another person to put on and taking off regular upper body clothing?: A Little Help from another person to put on and taking off regular lower body clothing?: A Little 6 Click Score: 18   End of Session Equipment Utilized During Treatment: Rolling walker (2 wheels);Gait belt Nurse Communication: Mobility status  Activity Tolerance: Patient tolerated treatment well Patient left: in bed;with call bell/phone within reach;with family/visitor present  OT Visit Diagnosis: Unsteadiness on feet (R26.81);Muscle weakness (generalized) (M62.81);Other symptoms and signs involving cognitive function;Hemiplegia and hemiparesis Hemiplegia - Right/Left: Left Hemiplegia - dominant/non-dominant: Non-Dominant Hemiplegia - caused by: Cerebral infarction                Time: 1011-1040 OT Time Calculation (min): 29 min Charges:  OT General Charges $OT Visit: 1 Visit OT Evaluation $OT Eval Moderate Complexity: 1 Mod OT Treatments $Self Care/Home Management : 8-22 mins  Elma JONETTA Lebron FREDERICK, OTR/L [facility] Acute Rehabilitation Office: [phone]   Elma JONETTA Lebron 06/23/2024, 2:45 PM

## 2024-06-23 NOTE — Evaluation (Signed)
 Speech Language Pathology Evaluation Patient Details Name: Monica Austin MRN: [id number] DOB: 1956/05/31 Today's Date: 06/23/2024 Time: 8881-8864 SLP Time Calculation (min) (ACUTE ONLY): 17 min  Problem List:  Patient Active Problem List   Diagnosis Date Noted   Dysphagia 06/23/2024   Fatigue    Past Medical History:  Past Medical History:  Diagnosis Date   Fatigue    Hyperlipidemia    Hypertension    NIDDM (non-insulin dependent diabetes mellitus)    Borderline   Past Surgical History:  Past Surgical History:  Procedure Laterality Date   BREAST BIOPSY     BTL     COLONOSCOPY  02/17/2020   HERNIA REPAIR     OVARIAN CYST REMOVAL     HPI:  68 yo recently admitted to hospital in [facility] while on vacation and dx with R basal ganglia stroke. SLP clinical swallow eval completed there on 06/22/24 reported impaired mastication, and IDDSI diet level 7 (easy to chew) was recommended. No overt pharyngeal symptoms observed but subjectively pt reported that her saliva felt like it went down the wrong way intermittently so MBS was recommended. Report not available so not sure if this was completed prior to pt leaving AMA 6/29, driving to [place] with family and reporting difficulty swallowing along the way. She presented directly to [facility] at the recommendation of MD in [facility]. PMH significant for HTN, HLD, DMII.   Assessment / Plan / Recommendation Clinical Impression  Pt presents with acute changes to her cognition and speech, living independently PTA and working. She has a mild-moderate dysarthria with imprecise articulation and monotonous pitch. Min cues were given for command following and she needed extra processing time when responding to questions. She had reduced carry over from OT evaluation earlier this morning with evidence of reduced intellectual and safety awareness. She thought that she could get up and walk independently and needed cues for simple verbal problem solving to determine a  safer alternative. Pt would benefit from intensive SLP f/u to maximize safety and independence.      SLP Assessment  SLP Recommendation/Assessment: Patient needs continued Speech Language Pathology Services SLP Visit Diagnosis: Dysarthria and anarthria (R47.1);Cognitive communication deficit (R41.841)     Assistance Recommended at Discharge  Frequent or constant Supervision/Assistance  Functional Status Assessment Patient has had a recent decline in their functional status and demonstrates the ability to make significant improvements in function in a reasonable and predictable amount of time.  Frequency and Duration min 2x/week  2 weeks      SLP Evaluation Cognition  Overall Cognitive Status: Impaired/Different from baseline Arousal/Alertness: Awake/alert Orientation Level: Oriented X4 Attention: Sustained Sustained Attention: Appears intact Memory: Impaired Memory Impairment: Decreased recall of new information Awareness: Impaired Awareness Impairment: Intellectual impairment;Other (comment) (online awareness) Problem Solving: Impaired Problem Solving Impairment: Verbal basic Safety/Judgment: Impaired       Comprehension  Auditory Comprehension Overall Auditory Comprehension: Impaired Commands: Impaired One Step Basic Commands: 75-100% accurate Conversation: Simple Interfering Components: Processing speed    Expression Expression Primary Mode of Expression: Verbal Verbal Expression Overall Verbal Expression: Appears within functional limits for tasks assessed   Oral / Motor  Oral Motor/Sensory Function Overall Oral Motor/Sensory Function: Mild impairment Facial ROM: Reduced left;Suspected CN VII (facial) dysfunction Facial Symmetry: Abnormal symmetry left;Suspected CN VII (facial) dysfunction Facial Strength: Reduced left;Suspected CN VII (facial) dysfunction Lingual ROM: Within Functional Limits Lingual Symmetry: Abnormal symmetry left;Suspected CN XII  (hypoglossal) dysfunction Lingual Strength: Reduced Motor Speech Overall Motor Speech: Impaired Respiration: Within functional  limits Articulation: Impaired Level of Impairment: Sentence Intelligibility: Intelligibility reduced Word: 75-100% accurate Phrase: 75-100% accurate Sentence: 75-100% accurate            Leita SAILOR., M.A. CCC-SLP Acute Rehabilitation Services Office: [phone]  Secure chat preferred  06/23/2024, 12:51 PM

## 2024-06-23 NOTE — ED Notes (Signed)
 Pt up to the bathroom

## 2024-06-23 NOTE — H&P (Addendum)
 History and Physical    Patient: Monica Austin FMW:[id number] DOB: 10-15-1956 DOA: 06/23/2024 DOS: the patient was seen and examined on 06/23/2024 PCP: Tisovec, Charlie ORN, MD  Patient coming from: Home  Chief Complaint:  Chief Complaint  Patient presents with   Cerebrovascular Accident   HPI: Monica Austin is a 68 y.o. female with medical history significant of hypertension, hyperlipidemia, diabetes mellitus type 2 who presents after experienced while on vacation in [place] .  She symptoms began possibly Firday evening as she recalls her grandson picking at the way she was speaking.  Last known normal was sometime around 9 PM on 6/27.  She awoke with weakness primarily on the left side, affecting her left arm and leg.   She had initially been seen at the [facility] on 6/28 where records note CTA of the head and neck did not reveal any large vessel occlusion. No intervention was performed as patient was out of the window.  MRI of the brain noted a 2.5 cm acute infarct in the right basal ganglia to the low corona radiata, also involving the right frontal operculum.  Echocardiogram noted EF to be 60 to 65% with diastolic dysfunction.  Was recommended that she stay in the hospital to recuperate, but due to their vacation go over ultimately left AGAINST MEDICAL ADVICE to come back to the [place] . 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  Her primary care provider had been in [place] .  She makes note that she had still been taking Crestor, Singulair, and metformin.  She was not on anything for blood pressure and reported allergy of tongue swelling due to lisinopril.  She has not been taking aspirin regularly prior to this event. No history of heart racing or palpitations. Her blood sugar levels have been in  the 200s here, indicating poor glycemic control. She does not smoke or drink alcohol.  She has been taking Crestor at a dose of 10 mg at home for hyperlipidemia.  In the ED patient was noted to be afebrile her blood pressure was elevated 156/95, and all other vital signs maintained.  Labs significant for BUN 16, creatinine 1.01, glucose 253, and hemoglobin A1c 9.7.  Urinalysis positive for nitrites, many bacteria, 6-10 squamous epithelial cells, and 6-10 WBCs.  Neurology had been consulted and recommended admission for therapy evaluation.  Review of Systems: As mentioned in the history of present illness. All other systems reviewed and are negative. Past Medical History:  Diagnosis Date   Fatigue    NIDDM (non-insulin dependent diabetes mellitus)    Borderline   Past Surgical History:  Procedure Laterality Date   BREAST BIOPSY     BTL     COLONOSCOPY  02/17/2020   HERNIA REPAIR     OVARIAN CYST REMOVAL     Social History:  reports that she has never smoked. She has never used smokeless tobacco. She reports that she does not currently use alcohol. She reports that she does not use drugs.  Allergies  Allergen Reactions   Lisinopril Swelling    Tongue swelling   Simvastatin Swelling    Family History  Problem Relation Age of Onset   COPD Mother    Cancer Father    Colon polyps Sister    Colon cancer Neg Hx    Esophageal cancer Neg Hx  Pancreatic cancer Neg Hx    Stomach cancer Neg Hx     Prior to Admission medications   Medication Sig Start Date End Date Taking? Authorizing Provider  azithromycin  (ZITHROMAX ) 250 MG tablet Take 1 tablet (250 mg total) by mouth daily. Take first 2 tablets together, then 1 every day until finished. 06/02/22   Teresa Shelba SAUNDERS, NP  cetirizine  (ZYRTEC  ALLERGY) 10 MG tablet Take 1 tablet (10 mg total) by mouth daily. 06/02/22   White, Adrienne R, NP  metFORMIN (GLUCOPHAGE) 850 MG tablet metformin 850 mg tablet  TAKE 1 TABLET BY MOUTH TWICE DAILY  WITH MEALS    [provider]  Multiple Vitamin (MULTIVITAMIN) capsule Take 1 capsule by mouth daily.    [provider]  predniSONE  (DELTASONE ) 20 MG tablet Take 2 tablets (40 mg total) by mouth daily. 06/02/22   Teresa Shelba SAUNDERS, NP    Physical Exam: Vitals:   06/23/24 0249 06/23/24 0315 06/23/24 0631  BP: (!) 156/95 126/82 (!) 146/86  Pulse: 77 71 67  Resp: 18 16 14   Temp: 97.9 F (36.6 C)  (!) 97.5 F (36.4 C)  TempSrc:   Axillary  SpO2: 96% 93% 95%    Constitutional: Elderly female currently in no acute distress Eyes: PERRL, lids and conjunctivae normal ENMT: Mucous membranes are moist.  Decreased sensation on left side of face with left-sided facial droop.   Neck: normal, supple, no masses, no thyromegaly Respiratory: clear to auscultation bilaterally, no wheezing, no crackles. Normal respiratory effort. No accessory muscle use.  Cardiovascular: Regular rate and rhythm, no murmurs / rubs / gallops. No extremity edema. 2+ pedal pulses. No carotid bruits.  Abdomen: no tenderness, no masses palpated. No hepatosplenomegaly. Bowel sounds positive.  Musculoskeletal: no clubbing / cyanosis. No joint deformity upper and lower extremities. Good ROM, no contractures. Normal muscle tone.  Skin: no rashes, lesions, ulcers.   Neurologic: CN 2-12 grossly intact.  Dysphagia present.  Decreased sensation on the left side.  Strength 4/5 in left upper and left lower extremity. Psychiatric: Normal judgment and insight. Alert and oriented x 3. Normal mood.   Data Reviewed:  Reviewed labs, imaging, and pertinent records as documented.  Assessment and Plan:  CVA Patient presents with continued left-sided weakness and dysphagia after being diagnosed with a stroke of the right basal ganglia to the low corona radiata, also involving the right frontal operculumon on 6/28 while out of town in [place] .  Patient had not been on daily aspirin prior to this event.  Neurology evaluated  and gave recommendations as noted below.  Patient's son notes that she is likely not able to care for herself like she previously did prior to this event. - Admit to a cardiac telemetry bed - Aspirin and Plavix for 90 days - PT/OT/speech to evaluate and treat -Follow-up telemetry - Transitions of care consulted for possible need of placement/rehab  Possible urinary tract infection  Urinalysis was positive for nitrites, many bacteria,  6-10 squamous epithelial cells, and 6-10 WBCs patient had been started on Rocephin IV. - Check urine culture - Continue empiric antibiotics of Rocephin  Uncontrolled diabetes mellitus type 2, without long-term use of insulin Hemoglobin A1c noted to be 9.7.  Patient was on metformin 850 mg daily. - Hypoglycemic protocols - CBGs before every meal with sensitive SSI - Semglee 10 units daily  Essential hypertension On admission blood pressures elevated up to 175/87.  It is unclear if patient actually was taking metoprolol as appears last was prescribed  back in March. - Restart metoprolol succinate 25 mg daily - Continue to monitor and adjust blood pressure regimen as deemed medically appropriate  Hyperlipidemia - Increase Crestor to 20 mg p.o. daily per neurology recommendation  DVT prophylaxis: Lovenox Advance Care Planning:   Code Status: Full Code   Consults: Neurology  Family Communication: Son updated at bedside  Severity of Illness: The appropriate patient status for this patient is OBSERVATION. Observation status is judged to be reasonable and necessary in order to provide the required intensity of service to ensure the patient's safety. The patient's presenting symptoms, physical exam findings, and initial radiographic and laboratory data in the context of their medical condition is felt to place them at decreased risk for further clinical deterioration. Furthermore, it is anticipated that the patient will be medically stable for discharge from the  hospital within 2 midnights of admission.   Author: Maximino DELENA Sharps, MD 06/23/2024 7:13 AM  For on call review [email or web address].

## 2024-06-23 NOTE — ED Notes (Signed)
 Call placed to [facility], they state this pt is already being monitored.

## 2024-06-23 NOTE — ED Notes (Signed)
Pt ambulated with walker to the restroom.

## 2024-06-23 NOTE — Evaluation (Signed)
 Clinical/Bedside Swallow Evaluation Patient Details  Name: Monica Austin MRN: [id number] Date of Birth: [birth year]  Today's Date: 06/23/2024 Time: SLP Start Time (ACUTE ONLY): 1101 SLP Stop Time (ACUTE ONLY): 1118 SLP Time Calculation (min) (ACUTE ONLY): 17 min  Past Medical History:  Past Medical History:  Diagnosis Date   Fatigue    Hyperlipidemia    Hypertension    NIDDM (non-insulin dependent diabetes mellitus)    Borderline   Past Surgical History:  Past Surgical History:  Procedure Laterality Date   BREAST BIOPSY     BTL     COLONOSCOPY  02/17/2020   HERNIA REPAIR     OVARIAN CYST REMOVAL     HPI:  68 yo recently admitted to hospital in [facility] while on vacation and dx with R basal ganglia stroke. SLP clinical swallow eval completed there on 06/22/24 reported impaired mastication, and IDDSI diet level 7 (easy to chew) was recommended. No overt pharyngeal symptoms observed but subjectively pt reported that her saliva felt like it went down the wrong way intermittently so MBS was recommended. Report not available so not sure if this was completed prior to pt leaving AMA 6/29, driving to [place] with family and reporting difficulty swallowing along the way. She presented directly to [facility] at the recommendation of MD in [facility]. PMH significant for HTN, HLD, DMII.    Assessment / Plan / Recommendation  Clinical Impression  Pt presents with signs of an oropharyngeal dysphagia included reduced labial seal and impaired mastication. She had one cough on her first sip of thin liquid without other overt s/s of aspiration, but she stopped drinking when trying to challenge her with three ounces of water consecutively, and she reports coughing on her saliva. MBS had been recommended at hospital in [facility], and per pt/son, this test was not completed before they left. Recommend proceeding with MBS here. In the meantime, discussed concern for possible pharyngeal dysphagia and signs of possible aspiration.  Pt would like to continue with PO diet with use of precautions pending completion of MBS, although she is amenable to adjusting solids to mechanical soft. Encouraged pt/family to hold POs if they are noticing more coughing.   SLP Visit Diagnosis: Dysphagia, unspecified (R13.10)    Aspiration Risk       Diet Recommendation Dysphagia 3 (Mech soft);Thin liquid    Liquid Administration via: Cup;Straw Medication Administration: Whole meds with puree Supervision: Patient able to self feed;Full supervision/cueing for compensatory strategies Compensations: Minimize environmental distractions;Slow rate;Small sips/bites;Monitor for anterior loss;Other (Comment) (hold POs if coughing) Postural Changes: Seated upright at 90 degrees    Other  Recommendations Oral Care Recommendations: Oral care BID     Assistance Recommended at Discharge    Functional Status Assessment Patient has had a recent decline in their functional status and demonstrates the ability to make significant improvements in function in a reasonable and predictable amount of time.  Frequency and Duration            Prognosis Prognosis for improved oropharyngeal function: Good Barriers to Reach Goals: Cognitive deficits      Swallow Study   General HPI: 68 yo recently admitted to hospital in [facility] while on vacation and dx with R basal ganglia stroke. SLP clinical swallow eval completed there on 06/22/24 reported impaired mastication, and IDDSI diet level 7 (easy to chew) was recommended. No overt pharyngeal symptoms observed but subjectively pt reported that her saliva felt like it went down the wrong way intermittently so MBS was recommended.  Report not available so not sure if this was completed prior to pt leaving AMA 6/29, driving to [place] with family and reporting difficulty swallowing along the way. She presented directly to [facility] at the recommendation of MD in [facility]. PMH significant for HTN, HLD, DMII. Type of Study: Bedside  Swallow Evaluation Previous Swallow Assessment: see HPI Diet Prior to this Study: Regular;Thin liquids (Level 0) Temperature Spikes Noted: No Respiratory Status: Room air History of Recent Intubation: No Behavior/Cognition: Alert;Cooperative;Pleasant mood;Requires cueing Oral Cavity Assessment: Within Functional Limits Oral Care Completed by SLP: No Oral Cavity - Dentition: Adequate natural dentition Vision: Functional for self-feeding Self-Feeding Abilities: Able to feed self Patient Positioning: Upright in bed Baseline Vocal Quality: Normal Volitional Cough: Weak Volitional Swallow: Able to elicit    Oral/Motor/Sensory Function Overall Oral Motor/Sensory Function: Mild impairment Facial ROM: Reduced left;Suspected CN VII (facial) dysfunction Facial Symmetry: Abnormal symmetry left;Suspected CN VII (facial) dysfunction Facial Strength: Reduced left;Suspected CN VII (facial) dysfunction Lingual ROM: Within Functional Limits Lingual Symmetry: Abnormal symmetry left;Suspected CN XII (hypoglossal) dysfunction Lingual Strength: Reduced   Ice Chips Ice chips: Not tested   Thin Liquid Thin Liquid: Impaired Presentation: Cup;Self Fed;Straw Oral Phase Impairments: Reduced labial seal Oral Phase Functional Implications: Left anterior spillage Pharyngeal  Phase Impairments: Cough - Immediate (x1)    Nectar Thick Nectar Thick Liquid: Not tested   Honey Thick Honey Thick Liquid: Not tested   Puree Puree: Within functional limits Presentation: Self Fed;Spoon   Solid     Solid: Impaired Presentation: Self Fed Oral Phase Impairments: Impaired mastication      Leita SAILOR., M.A. CCC-SLP Acute Rehabilitation Services Office: [phone]  Secure chat preferred  06/23/2024,12:37 PM

## 2024-06-23 NOTE — ED Triage Notes (Addendum)
 Pt here with family after vacationing in [place] . On Friday pt noted left sided facial droop and left arm weakness. Was admitted to the hospital until today, in which the family and patient needed to get back to [facility] where they live, so signed out and came straight to [facility] for re-admission. Have been driving for the past 12 hours. Obvious arm drift, facial droop, sensory deficit on the left side.

## 2024-06-23 NOTE — Plan of Care (Signed)

## 2024-06-23 NOTE — Evaluation (Signed)
 Physical Therapy Evaluation Patient Details Name: Monica Austin MRN: [id number] DOB: September 23, 1956 Today's Date: 06/23/2024  History of Present Illness  Pt is a 68 y.o. female presenting 6/30 with L sided weakness. Was on vacation on [place]  over the weekend for L weakness and diagnized with R basal ganglia stroke; left AMA to come home where her doctor in [place]  recommended she come directly to the hospital.. MRI brain demonstrates R basal ganglia infarct. CTA head/neck revealed multifocal intracranial atherosclerotic disease including moderate right M1 MCA stenosis and severe stenoses in other vessels. PMH significant for HTN, HLD, DMII.  Clinical Impression  Pt admitted with above diagnosis. Pt needs incr cues and assist to ambulate with rollator needing min assist and +2 for safety. Pt with poor attention to left hemibody.  Pt has difficulty advancing left LE and keeping left UE on rollator. Pt also impulsive with poor safety awareness.  Pt will benefit from post acute rehab > 3 hours day. Will follow acutely.  Pt currently with functional limitations due to the deficits listed below (see PT Problem List). Pt will benefit from acute skilled PT to increase their independence and safety with mobility to allow discharge.           If plan is discharge home, recommend the following: A little help with walking and/or transfers;A little help with bathing/dressing/bathroom;Assistance with cooking/housework;Assist for transportation;Help with stairs or ramp for entrance;Supervision due to cognitive status   Can travel by private vehicle        Equipment Recommendations Other (comment) (TBA)  Recommendations for Other Services  Rehab consult    Functional Status Assessment Patient has had a recent decline in their functional status and demonstrates the ability to make significant improvements in function in a reasonable and predictable amount of time.     Precautions / Restrictions  Precautions Precautions: Fall Restrictions Weight Bearing Restrictions Per Provider Order: No      Mobility  Bed Mobility Overal bed mobility: Needs Assistance Bed Mobility: Supine to Sit, Sit to Supine     Supine to sit: Min assist Sit to supine: Supervision        Transfers Overall transfer level: Needs assistance Equipment used: Rollator (4 wheels) Transfers: Sit to/from Stand Sit to Stand: Min assist, +2 safety/equipment           General transfer comment: Needs min assist of 2 for safety.Left LE with less weight on it.  Pt needed cues for hand placement as well to rollator and assist to place left UE on rollator.    Ambulation/Gait Ambulation/Gait assistance: Min assist, +2 safety/equipment Gait Distance (Feet): 75 Feet (75 feet x 2) Assistive device: Rollator (4 wheels) Gait Pattern/deviations: Step-through pattern, Decreased step length - left, Decreased stride length, Decreased stance time - left, Decreased dorsiflexion - left, Drifts right/left   Gait velocity interpretation: <1.8 ft/sec, indicate of risk for recurrent falls   General Gait Details: Pt was able to ambulate with rollator needing cues to sequence steps and rollator. Pt at times needed cues to step left LE as she shortens step length.  Appears to have decr attention to left side with poor awareness of where left UE was as well as not tracking fully to left without cuing. Pt also impulsive and walking too quickly intiialy.  Pt did walk to the bathroom and tried to urinate but couldnt go.  Stairs            Wheelchair Mobility     Tilt Bed  Modified Rankin (Stroke Patients Only) Modified Rankin (Stroke Patients Only) Pre-Morbid Rankin Score: No symptoms Modified Rankin: Moderately severe disability     Balance Overall balance assessment: Needs assistance Sitting-balance support: No upper extremity supported, Feet supported Sitting balance-Leahy Scale: Good Sitting balance -  Comments: statically   Standing balance support: No upper extremity supported, Bilateral upper extremity supported, During functional activity Standing balance-Leahy Scale: Poor Standing balance comment: CGA on intial static stand; but with fatigue needs up to min A                             Pertinent Vitals/Pain Pain Assessment Pain Assessment: No/denies pain Faces Pain Scale: No hurt    Home Living Family/patient expects to be discharged to:: Private residence Living Arrangements: Other relatives (dog and grand son per report, but per later report pt most frequently stays with daughter in [place] ) Available Help at Discharge: Family;Available 24 hours/day Type of Home: House (has a house in [place], which her grandson also stays at, but she also goes to [place] for periods of time to work and she stays with her daughter while up there) Home Access: Stairs to enter Entrance Stairs-Rails: Doctor, general practice of Steps: 6-7   Home Layout: One level;Laundry or work area in [employer]: Grab bars - tub/shower;Hand held shower head      Prior Function Prior Level of Function : Independent/Modified Independent;Driving;Working/employed               ADLs Comments: works in Engineer, mining; predominantly desk/phone calls     Extremity/Trunk Assessment   Upper Extremity Assessment Upper Extremity Assessment: Right hand dominant LUE Deficits / Details: decr coordination speed as well as generally weak. 4-/5. poor initiation as compared to R. Decr attention to L side at times. LUE Sensation: decreased light touch;decreased proprioception LUE Coordination: decreased fine motor;decreased gross motor    Lower Extremity Assessment Lower Extremity Assessment: LLE deficits/detail LLE Deficits / Details: grossly 3/5       Communication   Communication Communication: Impaired Factors Affecting Communication: Reduced clarity of  speech (questionable word finding difficulty as well)    Cognition Arousal: Alert Behavior During Therapy: WFL for tasks assessed/performed, Flat affect                             Following commands: Impaired Following commands impaired: Follows one step commands with increased time, Follows multi-step commands inconsistently     Cueing Cueing Techniques: Verbal cues, Tactile cues, Gestural cues     General Comments General comments (skin integrity, edema, etc.): 88 bpm, 96% RA, 141/87 pre gait; 159/94 post gait    Exercises General Exercises - Lower Extremity Ankle Circles/Pumps: AROM, Both, 5 reps, Supine Long Arc Quad: AROM, Both, 5 reps, Seated   Assessment/Plan    PT Assessment Patient needs continued PT services  PT Problem List Decreased activity tolerance;Decreased strength;Decreased range of motion;Decreased balance;Decreased mobility;Decreased knowledge of use of DME;Decreased safety awareness;Decreased cognition;Decreased knowledge of precautions;Cardiopulmonary status limiting activity;Obesity       PT Treatment Interventions DME instruction;Gait training;Functional mobility training;Therapeutic activities;Therapeutic exercise;Balance training;Patient/family education    PT Goals (Current goals can be found in the Care Plan section)  Acute Rehab PT Goals Patient Stated Goal: to get stronger and then back to [place]  PT Goal Formulation: With patient Time For Goal Achievement: 07/07/24 Potential to Achieve Goals: Good    Frequency  Min 3X/week     Co-evaluation               AM-PAC PT 6 Clicks Mobility  Outcome Measure Help needed turning from your back to your side while in a flat bed without using bedrails?: A Little Help needed moving from lying on your back to sitting on the side of a flat bed without using bedrails?: A Little Help needed moving to and from a bed to a chair (including a wheelchair)?: A Little Help needed standing up  from a chair using your arms (e.g., wheelchair or bedside chair)?: A Little Help needed to walk in hospital room?: Total Help needed climbing 3-5 steps with a railing? : Total 6 Click Score: 14    End of Session Equipment Utilized During Treatment: Gait belt Activity Tolerance: Patient limited by fatigue Patient left: with call bell/phone within reach (on stretcher) Nurse Communication: Mobility status PT Visit Diagnosis: Unsteadiness on feet (R26.81);Muscle weakness (generalized) (M62.81);Hemiplegia and hemiparesis Hemiplegia - Right/Left: Left Hemiplegia - dominant/non-dominant: Non-dominant Hemiplegia - caused by: Nontraumatic intracerebral hemorrhage    Time: 1237-1310 PT Time Calculation (min) (ACUTE ONLY): 33 min   Charges:   PT Evaluation $PT Eval Moderate Complexity: 1 Mod PT Treatments $Gait Training: 8-22 mins PT General Charges $$ ACUTE PT VISIT: 1 Visit         Loretta Doutt M,PT Acute Rehab Services [phone]   Stephane JULIANNA Bevel 06/23/2024, 3:22 PM

## 2024-06-23 NOTE — ED Provider Notes (Signed)
 [place] EMERGENCY DEPARTMENT AT [facility] Provider Note   CSN: [id number] Arrival date & time: 06/23/24  0244     Patient presents with: Cerebrovascular Accident   Monica Austin is a 68 y.o. female with history of  She was found to have an acute 2.5 cm infarct. right basal ganglia stroke diagnosed on 6/28 while on vacation in [place]. Patient was admitted to [facility] where she underwent CT head and CTA head and neck as well as TEE and MRI brain.   Patient and her family live here locally and the decided to check the patient out AMA from [facility] and presented immediately to our emergency department upon return to [facility] this evening.  She did receive a dose of Lovenox in the hospital prior to leaving and has had her aspirin and Plavix today as well.  Family states that the physician at the hospital that they left directed them to present immediately to the hospital upon their arrival home for readmission though they are unsure what she would need to be readmitted for.  Per family patient with difficulty standing independently at this time.  She has history of non-insulin-dependent diabetes on metformin.  Now also on atorvastatin, aspirin and Plavix.     HPI     Prior to Admission medications   Medication Sig Start Date End Date Taking? Authorizing Provider  azithromycin  (ZITHROMAX ) 250 MG tablet Take 1 tablet (250 mg total) by mouth daily. Take first 2 tablets together, then 1 every day until finished. 06/02/22   White, Shelba SAUNDERS, NP  cetirizine  (ZYRTEC  ALLERGY) 10 MG tablet Take 1 tablet (10 mg total) by mouth daily. 06/02/22   White, Adrienne R, NP  metFORMIN (GLUCOPHAGE) 850 MG tablet metformin 850 mg tablet  TAKE 1 TABLET BY MOUTH TWICE DAILY WITH MEALS    [provider]  Multiple Vitamin (MULTIVITAMIN) capsule Take 1 capsule by mouth daily.    [provider]  predniSONE  (DELTASONE ) 20 MG tablet Take 2 tablets (40 mg total) by mouth  daily. 06/02/22   Teresa Shelba SAUNDERS, NP    Allergies: Lisinopril and Simvastatin    Review of Systems  Neurological:  Positive for facial asymmetry, speech difficulty and weakness.    Updated Vital Signs BP 126/82   Pulse 71   Temp 97.9 F (36.6 C)   Resp 16   SpO2 93%   Physical Exam Constitutional:      Appearance: She is not toxic-appearing.  HENT:     Head: Normocephalic and atraumatic.   Eyes:     General: Lids are normal. Vision grossly intact. No visual field deficit.    Extraocular Movements: Extraocular movements intact.     Pupils: Pupils are equal, round, and reactive to light.     Visual Fields: Right eye visual fields normal and left eye visual fields normal.   Neck:     Trachea: Trachea and phonation normal.   Cardiovascular:     Rate and Rhythm: Normal rate and regular rhythm.     Heart sounds: Normal heart sounds. No murmur heard. Pulmonary:     Effort: No tachypnea, bradypnea, accessory muscle usage or prolonged expiration.     Breath sounds: Examination of the right-lower field reveals wheezing. Wheezing present.  Chest:     Chest wall: No mass, lacerations, tenderness, crepitus or edema.  Abdominal:     Palpations: Abdomen is soft.     Tenderness: There is no abdominal tenderness. There is no right CVA  tenderness, left CVA tenderness, guarding or rebound.   Musculoskeletal:     Cervical back: Normal range of motion.     Right lower leg: No edema.     Left lower leg: No edema.   Skin:    General: Skin is warm.     Capillary Refill: Capillary refill takes less than 2 seconds.   Neurological:     Mental Status: She is alert and oriented to person, place, and time.     GCS: GCS eye subscore is 4. GCS verbal subscore is 5. GCS motor subscore is 6.     Cranial Nerves: Dysarthria and facial asymmetry present.     Sensory: Sensation is intact.     Motor: Weakness present. No pronator drift.     Coordination: Coordination abnormal. Finger-Nose-Finger  Test and Heel to [facility] Test normal. Rapid alternating movements normal.     Comments: Left-sided facial droop, dysarthria.  3/5 grip strength on the left compared to 5/5 on the right.  Difficulty with finger-to-nose with the left arm, no challenge with the right.  Romberg and gait deferred for patient's safety given family report of difficulty standing independently at this time.    (all labs ordered are listed, but only abnormal results are displayed) Labs Reviewed  COMPREHENSIVE METABOLIC PANEL WITH GFR - Abnormal; Notable for the following components:      Result Value   Glucose, Bld 253 (*)    Creatinine, Ser 1.01 (*)    All other components within normal limits  URINALYSIS, ROUTINE W REFLEX MICROSCOPIC - Abnormal; Notable for the following components:   APPearance CLOUDY (*)    Nitrite POSITIVE (*)    Bacteria, UA MANY (*)    All other components within normal limits  CBC WITH DIFFERENTIAL/PLATELET  PROTIME-INR  HEMOGLOBIN A1C    EKG: None  Radiology: No results found.   Procedures   Medications Ordered in the ED  cefTRIAXone (ROCEPHIN) 1 g in sodium chloride  0.9 % 100 mL IVPB (has no administration in time range)    Clinical Course as of 06/23/24 0616  Mon Jun 23, 2024  0425 Consult to Dr. Jerrie, neurologist who states that patient had complete stroke workup at the preceding hospital, so no further workup no further workup warranted, however she will see the patient to advise length of DAPT. I appreciate her collaboration in the care of this patient. Per Dr. Jerrie, patient will require 90 days DAPT.  [RS]  [phone] Patient ambulated to and from the restroom independently with a walker, per paramedic Brianne.  [RS]  [phone] Consult to Dr. Devona, who is agreeable to admitting this patient to her service. I appreciate her collaboration in the care of this patient.  [RS]    Clinical Course User Index [RS] Carrine Kroboth, Pleasant SAUNDERS, PA-C                                 Medical  Decision Making 68 y/o female who presents with concern for recent CVA at outside facility.   Mildly hypertensive on intake, VS otherwise normal. Cardiopulmonary exam is unremarkable, abdominal exam is benign. Neuro exam as above.   Amount and/or Complexity of Data Reviewed Labs: ordered.    Details: CBC unremarkable, CMP with hyperglycemia.  UA with concern for infection with positive nitrates and many bacteria, will initiate on antibiotics ECG/medicine tests:     Details:   EKG with sinus rhythm.  Risk Decision  regarding hospitalization.    Patient poorly tolerating p.o. due to difficulty swallowing following CVA, has no outpatient connections with care team here in [place] therefore would be difficult to coordinate outpatient SLP/OT/PT.  Discussion with the family, Dr. Jeannetta at neurologist, and hospital medicine.  Patient will be admitted to the hospital medicine service for evaluation by the services and coordination for outpatient follow-up.  From a neurologic standpoint no further workup warranted after complete workup had initial diagnosing facility in [place] .  Will continue DAPT therapy for 3 months per neurologist.  Monica Austin and her family voiced understanding of her medical evaluation and treatment plan. Each of their questions answered to their expressed satisfaction.  They are amenable to plan for admission at this time.  This chart was dictated using voice recognition software, Dragon. Despite the best efforts of this provider to proofread and correct errors, errors may still occur which can change documentation meaning.      Final diagnoses:  Cerebral infarction, unspecified mechanism (HCC)  Urinary tract infection without hematuria, site unspecified    ED Discharge Orders     None          Bobette Pleasant JONELLE DEVONNA 06/23/24 0616    Palumbo, April, MD 06/23/24 [phone]

## 2024-06-23 NOTE — Consult Note (Signed)
 NEUROLOGY CONSULT NOTE   Date of service: June 23, 2024 Patient Name: Monica Austin MRN:  [id number] DOB:  01-14-1956 Chief Complaint: stroke Requesting Provider: Nettie Earing, MD  History of Present Illness  Monica Austin is a 68 y.o. female with hx of hypertension, hyperlipidemia, type 2 diabetes  She was in [place]  vacationing with her family went to bed normal at 11 PM on 6/27.  Awoke on 6/28 with left-sided weakness, slurred speech, dysphagia.  Presented to hospital in [place]  where TNK was not administered as she was out of the window.  MRI brain revealed a right basal ganglia infarct CTA head and neck revealed multifocal intracranial atherosclerotic disease including moderate right M1 MCA stenosis and severe stenoses in other vessels There was no A1c result and there was no duration for dual antiplatelet therapy listed in the paperwork viewable to me in the EMR, nor does family report they were instructed regarding duration of DAPT They note that she was instructed to increase her Crestor 10 mg to atorvastatin 80 mg for high intensity statin, however they report prescriptions were not provided  While it was recommended that she stay to recuperate in the hospital for 3 to 4 days, with anticipation that she would regain her strength and be stable for discharge. However due to their vacation being over family and patient chose to leave AMA to come back to [place]  where they live.  Complicating the patient's care, she does not have a local primary care physician, and while she has a house here in [place]  she works in [place]  and sees a physician who practices in [place]  but sees her by video visit when she is in [place]   She did not have full PT/OT evaluation per her report and also notes she was told to eat a soft diet. Unfortunately has been having aspiration today despite trying to eat soft things during her travels from [place]  to [place]   LKW: 11 PM on  6/27 Modified rankin score: 0 IV Thrombolysis: No, out of the window EVT: No, exam not c/w LVO   NIHSS components Score: Comment  1a Level of Conscious 0[]  1[x]  2[]  3[]      1b LOC Questions 0[x]  1[]  2[]       1c LOC Commands 0[x]  1[]  2[]       2 Best Gaze 0[x]  1[]  2[]       3 Visual 0[x]  1[]  2[]  3[]      4 Facial Palsy 0[]  1[x]  2[]  3[]      5a Motor Arm - left 0[]  1[x]  2[]  3[]  4[]  UN[]    5b Motor Arm - Right 0[x]  1[]  2[]  3[]  4[]  UN[]    6a Motor Leg - Left 0[]  1[x]  2[]  3[]  4[]  UN[]    6b Motor Leg - Right 0[]  1[x]  2[]  3[]  4[]  UN[]    7 Limb Ataxia 0[x]  1[]  2[]  UN[]      8 Sensory 0[]  1[x]  2[]  UN[]      9 Best Language 0[x]  1[]  2[]  3[]      10 Dysarthria 0[]  1[x]  2[]  UN[]      11 Extinct. and Inattention 0[x]  1[]  2[]       TOTAL:       ROS  Comprehensive ROS performed and pertinent positives documented in HPI   Past History   Past Medical History:  Diagnosis Date   Fatigue    NIDDM (non-insulin dependent diabetes mellitus)    Borderline    Past Surgical History:  Procedure Laterality Date   BREAST BIOPSY     BTL  COLONOSCOPY  02/17/2020   HERNIA REPAIR     OVARIAN CYST REMOVAL      Family History: Family History  Problem Relation Age of Onset   COPD Mother    Cancer Father    Colon polyps Sister    Colon cancer Neg Hx    Esophageal cancer Neg Hx    Pancreatic cancer Neg Hx    Stomach cancer Neg Hx     Social History  reports that she has never smoked. She has never used smokeless tobacco. She reports that she does not currently use alcohol. She reports that she does not use drugs.  Allergies  Allergen Reactions   Lisinopril Swelling    Tongue swelling   Simvastatin Swelling    Medications  No current facility-administered medications for this encounter.  Current Outpatient Medications:    azithromycin  (ZITHROMAX ) 250 MG tablet, Take 1 tablet (250 mg total) by mouth daily. Take first 2 tablets together, then 1 every day until finished., Disp: 6 tablet, Rfl:  0   cetirizine  (ZYRTEC  ALLERGY) 10 MG tablet, Take 1 tablet (10 mg total) by mouth daily., Disp: 30 tablet, Rfl: 0   metFORMIN (GLUCOPHAGE) 850 MG tablet, metformin 850 mg tablet  TAKE 1 TABLET BY MOUTH TWICE DAILY WITH MEALS, Disp: , Rfl:    Multiple Vitamin (MULTIVITAMIN) capsule, Take 1 capsule by mouth daily., Disp: , Rfl:    predniSONE  (DELTASONE ) 20 MG tablet, Take 2 tablets (40 mg total) by mouth daily., Disp: 10 tablet, Rfl: 0  Vitals   Vitals:   06-24-24 0249 06/24/2024 0315  BP: (!) 156/95 126/82  Pulse: 77 71  Resp: 18 16  Temp: 97.9 F (36.6 C)   SpO2: 96% 93%    There is no height or weight on file to calculate BMI.   Physical Exam   Constitutional: Appears very fatigued, appropriate given time of night Psych: Affect calm and cooperative Eyes: No scleral injection HENT: No oropharyngeal obstruction.  MSK: no joint deformities.  Cardiovascular: Normal rate and regular rhythm. Perfusing extremities well Respiratory: Effort normal, non-labored breathing GI: Soft.  No distension. There is no tenderness.  Skin: Warm dry and intact visible skin  Neurologic Examination   Mental Status: Patient is awake, alert, oriented to person, place, month, year, and situation. Patient is able to give a clear and coherent history. No signs of aphasia or neglect Cranial Nerves: II: Visual Fields are full. Pupils are equal, round, and reactive to light.   III,IV, VI: EOMI without ptosis or diploplia V: Facial sensation is reduced on the left face VII: Facial movement is notable for left facial droop.  VIII: hearing is intact to voice X: Uvula elevates symmetrically XI: Shoulder shrug is symmetric. XII: tongue is midline without atrophy or fasciculations.  Motor: Tone is normal. Bulk is normal.  5/5 throughout the right side.  On the left she is 4 to 5/5 in an upper motor neuron pattern Sensory: Diminished sensation in the left face arm and leg Cerebellar: FNF and HKS are  intact bilaterally Gait:  Not personally tested but patient reports she ambulated with a walker to the bathroom while in the ED, and felt steady with this support   Labs/Imaging/Neurodiagnostic studies   CBC:  Recent Labs  Lab 2024/06/24 0327  WBC 7.9  NEUTROABS 3.9  HGB 13.4  HCT 39.3  MCV 83.3  PLT 246   Basic Metabolic Panel:  Lab Results  Component Value Date   NA 137 06-24-2024   K  3.8 06/23/2024   CO2 24 06/23/2024   GLUCOSE 253 (H) 06/23/2024   BUN 16 06/23/2024   CREATININE 1.01 (H) 06/23/2024   CALCIUM 9.6 06/23/2024   GFRNONAA >60 06/23/2024   INR  Lab Results  Component Value Date   INR 0.9 06/23/2024    CT angio Head and Neck with contrast(report reviewed):  CTA HEAD: 1. No large vessel occlusion. 2. Multifocal moderate to severe narrowing in the bilateral posterior cerebral arteries. 3. Severe focal narrowing at the M2 branch of the left MCA. 4. Moderate focal narrowing in the distal M1 segment of the right MCA. 5. Focal severe narrowing versus fenestration along the A1 segment of the right ACA. 6. Multifocal moderate to severe narrowing in the right vertebral artery. 7. Severe focal narrowing in the midportion of the left vertebral artery.  CTA NECK: 1. No hemodynamically significant stenosis of the major arteries of the neck. 2. Diffuse enlargement of the thyroid gland with multiple small subcentimeter hypodense nodules. 3. Multilevel cervical spondylosis with moderate spinal canal narrowing at C3-C4, C4-C5 and C5-C6 levels.   MRI Brain(report reviewed): CEREBRUM: 2.5 cm acute infarct of the right basal ganglia to low corona radiata, also involving the right frontal operculum. Scattered foci of T2 prolongation of the subcortical and periventricular white matter, likely representing chronic microangiopathic ischemic changes in a patient of this age. Old left occipital infarct.   ECHO:    Left Ventricle: Left ventricular size is normal. Normal wall  motion.  Normal systolic function with a visually estimated EF of 60 - 65%. EF by  2D Simpson biplane is 60%. EF by 2D Teichholz is 66%. Abnormal diastolic  function.   Right Ventricle: Right ventricular size is normal. Normal systolic  function. Normal TAPSE at 2.3 cm. TDI-derived tricuspid annular systolic  velocity (TDI S') is 15.0 cm/s (abnormal is <9.5 cm/s).    Aortic Valve: The valve appears trileaflet. Mildly thickened cusps.    Mitral Valve: Valve structure appears normal. Mild mitral thickening.    Interatrial Septum: An agitated saline bubble study was performed which  was negative at rest and with provocative maneuvers.   LDL was at goal 71.6  ASSESSMENT   Monica Austin is a 68 y.o. female presenting for need for therapy evaluations in the setting of acute stroke.  Unfortunately unable to view her imaging from [place]  as staff members who help import this into PACS are not available overnight, nor do I have access to computer with CD-ROM Drive  While none of the severe vessel stenoses described in the CTA report would be culprit for her stroke, she does have moderate focal narrowing in the distal M1 per report and given the overall burden of her intracranial atherosclerotic disease I do think a longer course of dual antiplatelet therapy would be reasonable especially given she has not had any bleeding concerns or history of significant bleed  RECOMMENDATIONS  -Hemoglobin A1c, goal less than 7% -PT evaluation -OT evaluation -SLP evaluation -Aspirin and Plavix for 90-day course -Increase home rosuvastatin (Crestor) from 10 mg to 20 mg for high intensity dose given she is tolerating rosuvastatin well -As no further inpatient neurological workup is anticipated, inpatient neurology will sign off at this time, thank you for involving us  in the care of this patient -Please ensure images are uploaded to PACS and disc is returned to patient so imaging can be fully reviewed on  outpatient neurology follow-up -Patient will need assistance establishing with local primary care physician and neurology  referral  ______________________________________________________________________   Lola Jernigan MD-PhD [facility] [phone] Available 7 PM to 7 AM, outside of these hours please call Neurologist on call as listed on Amion.

## 2024-06-23 NOTE — ED Notes (Signed)
 CCMD called to admit the patient for cardiac monitoring.

## 2024-06-23 NOTE — Inpatient Diabetes Management (Signed)
 Inpatient Diabetes Program Recommendations  AACE/ADA: New Consensus Statement on Inpatient Glycemic Control (2015)  Target Ranges:  Prepandial:   less than 140 mg/dL      Peak postprandial:   less than 180 mg/dL (1-2 hours)      Critically ill patients:  140 - 180 mg/dL   Lab Results  Component Value Date   GLUCAP 200 (H) 06/23/2024   HGBA1C 9.7 (H) 06/23/2024    Review of Glycemic Control  Latest Reference Range & Units 06/23/24 08:03 06/23/24 11:50  Glucose-Capillary 70 - 99 mg/dL 762 (H) 799 (H)   Diabetes history: DM 2 Outpatient Diabetes medications:  Metformin 850 mg daily Current orders for Inpatient glycemic control:  Novolog 0-15 units tid with meals and HS  Inpatient Diabetes Program Recommendations:    Note elevated A1C.  It appears that patient's DM medications will need to be adjusted.  While in the hospital, may consider adding low dose basal insulin such as Semglee 10 units daily.   Thanks,  Randall Bullocks, RN, BC-ADM Inpatient Diabetes Coordinator Pager [phone]  (8a-5p)

## 2024-06-24 ENCOUNTER — Observation Stay (HOSPITAL_COMMUNITY): Payer: Self-pay

## 2024-06-24 DIAGNOSIS — R1312 Dysphagia, oropharyngeal phase: Secondary | ICD-10-CM | POA: Diagnosis present

## 2024-06-24 DIAGNOSIS — I63511 Cerebral infarction due to unspecified occlusion or stenosis of right middle cerebral artery: Secondary | ICD-10-CM

## 2024-06-24 DIAGNOSIS — I639 Cerebral infarction, unspecified: Secondary | ICD-10-CM | POA: Diagnosis present

## 2024-06-24 DIAGNOSIS — J4520 Mild intermittent asthma, uncomplicated: Secondary | ICD-10-CM | POA: Diagnosis present

## 2024-06-24 DIAGNOSIS — N390 Urinary tract infection, site not specified: Secondary | ICD-10-CM | POA: Diagnosis present

## 2024-06-24 DIAGNOSIS — Z79899 Other long term (current) drug therapy: Secondary | ICD-10-CM | POA: Diagnosis not present

## 2024-06-24 DIAGNOSIS — I10 Essential (primary) hypertension: Secondary | ICD-10-CM | POA: Diagnosis present

## 2024-06-24 DIAGNOSIS — Z91198 Patient's noncompliance with other medical treatment and regimen for other reason: Secondary | ICD-10-CM | POA: Diagnosis not present

## 2024-06-24 DIAGNOSIS — R1310 Dysphagia, unspecified: Secondary | ICD-10-CM | POA: Diagnosis present

## 2024-06-24 DIAGNOSIS — Z91148 Patient's other noncompliance with medication regimen for other reason: Secondary | ICD-10-CM | POA: Diagnosis not present

## 2024-06-24 DIAGNOSIS — R110 Nausea: Secondary | ICD-10-CM | POA: Diagnosis not present

## 2024-06-24 DIAGNOSIS — R32 Unspecified urinary incontinence: Secondary | ICD-10-CM | POA: Diagnosis present

## 2024-06-24 DIAGNOSIS — Z888 Allergy status to other drugs, medicaments and biological substances status: Secondary | ICD-10-CM | POA: Diagnosis not present

## 2024-06-24 DIAGNOSIS — Z7952 Long term (current) use of systemic steroids: Secondary | ICD-10-CM | POA: Diagnosis not present

## 2024-06-24 DIAGNOSIS — Z7984 Long term (current) use of oral hypoglycemic drugs: Secondary | ICD-10-CM | POA: Diagnosis not present

## 2024-06-24 DIAGNOSIS — R29810 Facial weakness: Secondary | ICD-10-CM | POA: Diagnosis present

## 2024-06-24 DIAGNOSIS — M47812 Spondylosis without myelopathy or radiculopathy, cervical region: Secondary | ICD-10-CM | POA: Diagnosis present

## 2024-06-24 DIAGNOSIS — Z825 Family history of asthma and other chronic lower respiratory diseases: Secondary | ICD-10-CM | POA: Diagnosis not present

## 2024-06-24 DIAGNOSIS — R29707 NIHSS score 7: Secondary | ICD-10-CM | POA: Diagnosis present

## 2024-06-24 DIAGNOSIS — B9620 Unspecified Escherichia coli [E. coli] as the cause of diseases classified elsewhere: Secondary | ICD-10-CM | POA: Diagnosis present

## 2024-06-24 DIAGNOSIS — I69919 Unspecified symptoms and signs involving cognitive functions following unspecified cerebrovascular disease: Secondary | ICD-10-CM | POA: Diagnosis not present

## 2024-06-24 DIAGNOSIS — R471 Dysarthria and anarthria: Secondary | ICD-10-CM | POA: Diagnosis present

## 2024-06-24 DIAGNOSIS — G8194 Hemiplegia, unspecified affecting left nondominant side: Secondary | ICD-10-CM | POA: Diagnosis present

## 2024-06-24 DIAGNOSIS — E119 Type 2 diabetes mellitus without complications: Secondary | ICD-10-CM | POA: Diagnosis not present

## 2024-06-24 DIAGNOSIS — E785 Hyperlipidemia, unspecified: Secondary | ICD-10-CM | POA: Diagnosis present

## 2024-06-24 DIAGNOSIS — I6389 Other cerebral infarction: Secondary | ICD-10-CM | POA: Diagnosis present

## 2024-06-24 DIAGNOSIS — Z794 Long term (current) use of insulin: Secondary | ICD-10-CM | POA: Diagnosis not present

## 2024-06-24 DIAGNOSIS — E1165 Type 2 diabetes mellitus with hyperglycemia: Secondary | ICD-10-CM | POA: Diagnosis present

## 2024-06-24 DIAGNOSIS — Z83719 Family history of colon polyps, unspecified: Secondary | ICD-10-CM | POA: Diagnosis not present

## 2024-06-24 DIAGNOSIS — I69354 Hemiplegia and hemiparesis following cerebral infarction affecting left non-dominant side: Secondary | ICD-10-CM | POA: Diagnosis not present

## 2024-06-24 DIAGNOSIS — R4781 Slurred speech: Secondary | ICD-10-CM | POA: Diagnosis present

## 2024-06-24 LAB — CBC
HCT: 36.4 % (ref 36.0–46.0)
Hemoglobin: 12.3 g/dL (ref 12.0–15.0)
MCH: 28.2 pg (ref 26.0–34.0)
MCHC: 33.8 g/dL (ref 30.0–36.0)
MCV: 83.5 fL (ref 80.0–100.0)
Platelets: 239 10*3/uL (ref 150–400)
RBC: 4.36 MIL/uL (ref 3.87–5.11)
RDW: 13.4 % (ref 11.5–15.5)
WBC: 6.1 10*3/uL (ref 4.0–10.5)
nRBC: 0 % (ref 0.0–0.2)

## 2024-06-24 LAB — GLUCOSE, CAPILLARY
Glucose-Capillary: 233 mg/dL — ABNORMAL HIGH (ref 70–99)
Glucose-Capillary: 163 mg/dL — ABNORMAL HIGH (ref 70–99)
Glucose-Capillary: 174 mg/dL — ABNORMAL HIGH (ref 70–99)
Glucose-Capillary: 230 mg/dL — ABNORMAL HIGH (ref 70–99)

## 2024-06-24 LAB — BASIC METABOLIC PANEL WITH GFR
Anion gap: 7 (ref 5–15)
BUN: 14 mg/dL (ref 8–23)
CO2: 25 mmol/L (ref 22–32)
Calcium: 9.2 mg/dL (ref 8.9–10.3)
Chloride: 106 mmol/L (ref 98–111)
Creatinine, Ser: 0.91 mg/dL (ref 0.44–1.00)
GFR, Estimated: >60 mL/min (ref >60)
Glucose, Bld: 160 mg/dL — ABNORMAL HIGH (ref 70–99)
Potassium: 3.7 mmol/L (ref 3.5–5.1)
Sodium: 138 mmol/L (ref 135–145)

## 2024-06-24 NOTE — Evaluation (Signed)
 Modified Barium Swallow Study  Patient Details  Name: Monica Austin MRN: [id number] Date of Birth: 1956/12/18  Today's Date: 06/24/2024  Modified Barium Swallow completed.  Full report located under Chart Review in the Imaging Section.  History of Present Illness 68 yo recently admitted to hospital in [facility] while on vacation and dx with R basal ganglia stroke. SLP clinical swallow eval completed there on 06/22/24 reported impaired mastication, and IDDSI diet level 7 (easy to chew) was recommended. No overt pharyngeal symptoms observed but subjectively pt reported that her saliva felt like it went down the wrong way intermittently so MBS was recommended. Report not available so not sure if this was completed prior to pt leaving AMA 6/29, driving to [place] with family and reporting difficulty swallowing along the way. She presented directly to [facility] at the recommendation of MD in [facility]. PMH significant for HTN, HLD, DMII.   Clinical Impression Pt presents with an oral more than pharyngeal dysphagia. She has reduced labial seal, slow mastication, and delayed lingual transport. Oral deficits lead to premature loss of the bolus into the pharynx, with thin and nectar thick liquids also entering the laryngeal vestibule before the swallow. She has pretty good protective mechanisms of her airway, so that even when larger amounts of penetration occur (like with thin liquids via straw), she can almost always clear her laryngeal vestibule. If it does not occur immediately, it does on subsequent swallow. A chin tuck helped to reduce the volume of penetration compared to straw sips in a neutral position, but did not prevent penetration. Recommend that she continue with Dys 3 (mechanical soft) solids and thin liquids. If coughing persists at meal times, could consider further modifications (remove straws, try chin tuck, or thicken to nectar). Would still offer pills with puree as pt had difficulty clearing the barium  tablet from her oral cavity. When she ultimately did, it was transiently retained in the esophagus before clearing with a liquid wash.  Factors that may increase risk of adverse event in presence of aspiration Noe & Lianne 2021): Reduced cognitive function;Limited mobility  Swallow Evaluation Recommendations Recommendations: PO diet PO Diet Recommendation: Dysphagia 3 (Mechanical soft);Thin liquids (Level 0) Liquid Administration via: Cup;Straw Medication Administration: Whole meds with puree Supervision: Patient able to self-feed;Intermittent supervision/cueing for swallowing strategies;Set-up assistance for safety Swallowing strategies  : Slow rate;Small bites/sips;Check for anterior loss Postural changes: Position pt fully upright for meals Oral care recommendations: Oral care BID (2x/day)      Leita SAILOR., M.A. CCC-SLP Acute Rehabilitation Services Office: [phone]  Secure chat preferred  06/24/2024,1:42 PM

## 2024-06-24 NOTE — Progress Notes (Signed)
 PROGRESS NOTE    Monica Austin  FMW:[id number] DOB: [birth year] DOA: 06/23/2024 PCP: No primary care provider on file.    Brief Narrative:  68 year old with history of hypertension, hyperlipidemia, type 2 diabetes presented to the hospital with 3 days of left facial droop and weakness.  Patient was vacationing in [place] , had acute a stroke 6/27 and was admitted to the local hospital.  She had a stroke workup done that showed 2.5 cm acute infarct in the right basal ganglia.  She left AGAINST MEDICAL ADVICE and came to the [facility].  Admitted to complete the stroke workup and rehab.  Seen by neurology.  Subjective: Patient seen and examined.  Denies any complaints.  Still not with enough balance but she did try to walk with physical therapy earlier.  Denies any difficulty swallowing. Assessment & Plan:   Acute right MCA stroke: Clinical findings, left-sided weakness, dysphagia and left facial droop. MRI of the brain, right basal ganglia stroke CT angiogram head and neck, multifocal intracranial atherosclerotic disease. 2D echocardiogram, EF 60%.  No intracardiac thrombus. Antiplatelet therapy, LDL 71. Hemoglobin A1c, 9.7. Patient had completed stroke workup at outside hospital before coming to the emergency room.  Seen by neurology.  Recommendations are Aspirin Plavix for 90 days and continue aspirin alone.  Will send outpatient neurology follow-up. Seen by PT OT, will refer to acute inpatient rehab. Speech therapy continues to follow. Glycemic control as below.  Type 2 diabetes, uncontrolled with hyperglycemia: At home on metformin 850 mg daily.  A1c 9.7.  Will continue metformin.  Will suggest to add Semglee 10 units daily.  Essential hypertension blood pressure is elevated.  Metoprolol restarted.  Hyperlipidemia: Crestor 20 mg daily to continue.  Suspected UTI: Patient has long-term urinary incontinence but does not have acute symptoms.  Urine cultures pending.  Continue  Rocephin today pending urine cultures.     DVT prophylaxis: enoxaparin (LOVENOX) injection 40 mg Start: 06/23/24 1000   Code Status: Full code Family Communication: None at the bedside Disposition Plan: Status is: Observation The patient remains OBS appropriate and will d/c before 2 midnights.     Consultants:  Neurology  Procedures:  None  Antimicrobials:  Rocephin 6/30--     Objective: Vitals:   06/23/24 1400 06/23/24 1515 06/23/24 2018 06/24/24 0744  BP: (!) 162/92 (!) 154/62 (!) 156/73 135/76  Pulse: 71 78 69 (!) 56  Resp: 19  17 16   Temp:  98.6 F (37 C) 98.2 F (36.8 C) 97.7 F (36.5 C)  TempSrc:  Oral  Oral  SpO2: 99% 100% 100% 100%  Weight:   84 kg   Height:   5' 7 (1.702 m)     Intake/Output Summary (Last 24 hours) at 06/24/2024 1040 Last data filed at 06/24/2024 0616 Gross per 24 hour  Intake 100 ml  Output --  Net 100 ml   Filed Weights   06/23/24 2018  Weight: 84 kg    Examination:  General exam: Appears calm and comfortable  Respiratory system: Clear to auscultation. Respiratory effort normal. Cardiovascular system: S1 & S2 heard, RRR. No pedal edema. Gastrointestinal system: Abdomen is nondistended, soft and nontender. No organomegaly or masses felt. Normal bowel sounds heard. Central nervous system: Alert and oriented.  Left facial droop present. Left upper and lower extremity with reduced power 4/5 as compared to right.    Data Reviewed: I have personally reviewed following labs and imaging studies  CBC: Recent Labs  Lab 06/23/24 0327 06/24/24 0341  WBC  7.9 6.1  NEUTROABS 3.9  --   HGB 13.4 12.3  HCT 39.3 36.4  MCV 83.3 83.5  PLT 246 239   Basic Metabolic Panel: Recent Labs  Lab 06/23/24 0327 06/24/24 0341  NA 137 138  K 3.8 3.7  CL 103 106  CO2 24 25  GLUCOSE 253* 160*  BUN 16 14  CREATININE 1.01* 0.91  CALCIUM 9.6 9.2   GFR: Estimated Creatinine Clearance: 65 mL/min (by C-G formula based on SCr of 0.91  mg/dL). Liver Function Tests: Recent Labs  Lab 06/23/24 0327  AST 21  ALT 20  ALKPHOS 72  BILITOT 0.5  PROT 7.4  ALBUMIN 3.8   No results for input(s): LIPASE, AMYLASE in the last 168 hours. No results for input(s): AMMONIA in the last 168 hours. Coagulation Profile: Recent Labs  Lab 06/23/24 0327  INR 0.9   Cardiac Enzymes: No results for input(s): CKTOTAL, CKMB, CKMBINDEX, TROPONINI in the last 168 hours. BNP (last 3 results) No results for input(s): PROBNP in the last 8760 hours. HbA1C: Recent Labs    06/23/24 0630  HGBA1C 9.7*   CBG: Recent Labs  Lab 06/23/24 0803 06/23/24 1150 06/23/24 1642 06/23/24 2056 06/24/24 0755  GLUCAP 237* 200* 185* 271* 233*   Lipid Profile: No results for input(s): CHOL, HDL, LDLCALC, TRIG, CHOLHDL, LDLDIRECT in the last 72 hours. Thyroid Function Tests: No results for input(s): TSH, T4TOTAL, FREET4, T3FREE, THYROIDAB in the last 72 hours. Anemia Panel: No results for input(s): VITAMINB12, FOLATE, FERRITIN, TIBC, IRON, RETICCTPCT in the last 72 hours. Sepsis Labs: No results for input(s): PROCALCITON, LATICACIDVEN in the last 168 hours.  No results found for this or any previous visit (from the past 240 hours).       Radiology Studies: No results found.      Scheduled Meds:   stroke: early stages of recovery book   Does not apply Once   aspirin EC  81 mg Oral Daily   clopidogrel  75 mg Oral Daily   enoxaparin (LOVENOX) injection  40 mg Subcutaneous Q24H   insulin aspart  0-15 Units Subcutaneous TID WC   insulin aspart  0-5 Units Subcutaneous QHS   insulin glargine-yfgn  10 Units Subcutaneous Q24H   metoprolol succinate  25 mg Oral Daily   montelukast  10 mg Oral QHS   rosuvastatin  20 mg Oral QPM   sodium chloride  flush  3 mL Intravenous Q12H   Continuous Infusions:  cefTRIAXone (ROCEPHIN)  IV Stopped (06/24/24 0605)     LOS: 0 days    Time spent: 40  minutes    Renato Applebaum, MD [facility]

## 2024-06-24 NOTE — Plan of Care (Signed)

## 2024-06-24 NOTE — Inpatient Diabetes Management (Signed)
 Inpatient Diabetes Program Recommendations  AACE/ADA: New Consensus Statement on Inpatient Glycemic Control (2015)  Target Ranges:  Prepandial:   less than 140 mg/dL      Peak postprandial:   less than 180 mg/dL (1-2 hours)      Critically ill patients:  140 - 180 mg/dL   Lab Results  Component Value Date   GLUCAP 163 (H) 06/24/2024   HGBA1C 9.7 (H) 06/23/2024    Review of Glycemic Control  Latest Reference Range & Units 06/23/24 08:03 06/23/24 11:50 06/23/24 16:42 06/23/24 20:56 06/24/24 07:55 06/24/24 11:35  Glucose-Capillary 70 - 99 mg/dL 762 (H) 799 (H) 814 (H) 271 (H) 233 (H) 163 (H)   Diabetes history: DM 2 Outpatient Diabetes medications: Metformin 850 mg daily Current orders for Inpatient glycemic control:  Novolog 0-15 units tid with meals and HS Semglee 10 units daily  Inpatient Diabetes Program Recommendations:    Spoke to patient regarding DM management.  Discussed current A1C of 9.7%.  She states that this is high for her and that last A1C that she remembered was in 6% range.  She only takes Metformin once daily at home.  I briefly discussed insulin with patient and potential need for insulin.  She states that she does not want to be on insulin and wants to try to control with oral DM medications.  May consider increasing Metformin to 850 mg bid at discharge.  Also patient states that she needs a new meter b/c hers is not working.  Explained importance of glycemic control and encouraged close f/u with PCP.  Patient appreciative of visit.  Will follow.   Thanks,  Randall Bullocks, RN, BC-ADM Inpatient Diabetes Coordinator Pager [phone]  (8a-5p)

## 2024-06-24 NOTE — Progress Notes (Addendum)
 Inpatient Rehab Coordinator Note:  I met with patient at bedside to discuss CIR recommendations and goals/expectations of CIR stay.  We reviewed 3 hrs/day of therapy, physician follow up, and average length of stay 2 weeks (dependent upon progress) with goals of supervision.  Pt states her daughter is coming to stay with her at discharge.  Will call family to confirm discharge plan.  Reviewed insurance and pt does have an active BCBS policy that I confirmed effective 01/06/24, policy number [id number].  She confirms she should have Medicare but I've been unable to verify that policy/effective date.  Of note, number for son in patient's chart [phone]) is the patient's phone number.  I will try and contact family.    Reche Lowers, PT, DPT Admissions Coordinator [phone] 06/24/24  1:12 PM

## 2024-06-24 NOTE — Plan of Care (Signed)
   Problem: Education: Goal: Ability to describe self-care measures that may prevent or decrease complications (Diabetes Survival Skills Education) will improve Outcome: Progressing Goal: Individualized Educational Video(s) Outcome: Progressing

## 2024-06-24 NOTE — Progress Notes (Addendum)
 Mobility Specialist: Progress Note   06/24/24 1216  Mobility  Activity Ambulated with assistance in hallway  Level of Assistance Minimal assist, patient does 75% or more  Assistive Device Four wheel walker  Distance Ambulated (ft) 100 ft  Activity Response Tolerated fair  Mobility Referral Yes  Mobility visit 1 Mobility  Mobility Specialist Start Time (ACUTE ONLY) 0845  Mobility Specialist Stop Time (ACUTE ONLY) 0855  Mobility Specialist Time Calculation (min) (ACUTE ONLY) 10 min    Pt received in bed, agreeable to mobility session. SV for bed mobility and STS. CGA w/ ambulation for physical assist, but pt required max verbal and tactile cues for rollator proximity and to slow gait speed. Pt was very uncontrolled and impulsive, only able to follow a command for a few steps before needing another cue. Once returned to the room, pt stated she feels like the rollator is rolling away from her while she ambulates. Left in bed with all needs met, call bell in reach.   Ileana Lute Mobility Specialist Please contact via SecureChat or Rehab office at [phone]

## 2024-06-25 DIAGNOSIS — I63511 Cerebral infarction due to unspecified occlusion or stenosis of right middle cerebral artery: Secondary | ICD-10-CM | POA: Diagnosis not present

## 2024-06-25 LAB — GLUCOSE, CAPILLARY
Glucose-Capillary: 245 mg/dL — ABNORMAL HIGH (ref 70–99)
Glucose-Capillary: 155 mg/dL — ABNORMAL HIGH (ref 70–99)
Glucose-Capillary: 90 mg/dL (ref 70–99)
Glucose-Capillary: 145 mg/dL — ABNORMAL HIGH (ref 70–99)

## 2024-06-25 LAB — URINE CULTURE: Culture: >=100000 — AB

## 2024-06-25 MED ORDER — CEPHALEXIN 500 MG PO CAPS
500.0000 mg | ORAL_CAPSULE | Freq: Three times a day (TID) | ORAL | Status: DC
  Administered 2024-06-26: 500 mg via ORAL
  Filled 2024-06-25: qty 1

## 2024-06-25 MED ORDER — INSULIN GLARGINE-YFGN 100 UNIT/ML ~~LOC~~ SOLN
12.0000 [IU] | Freq: Every day | SUBCUTANEOUS | Status: DC
  Administered 2024-06-25 – 2024-06-26 (×2): 12 [IU] via SUBCUTANEOUS
  Filled 2024-06-25 (×2): qty 0.12

## 2024-06-25 MED ORDER — METFORMIN HCL 850 MG PO TABS
850.0000 mg | ORAL_TABLET | Freq: Two times a day (BID) | ORAL | Status: DC
  Administered 2024-06-25 – 2024-06-26 (×3): 850 mg via ORAL
  Filled 2024-06-25 (×3): qty 1

## 2024-06-25 NOTE — Plan of Care (Signed)

## 2024-06-25 NOTE — Progress Notes (Signed)
 Occupational Therapy Treatment Patient Details Name: Monica Austin MRN: [id number] DOB: 1956/07/10 Today's Date: 06/25/2024   History of present illness Pt is a 68 y.o. female presenting 6/30 with L sided weakness. Was on vacation on [place]  over the weekend for L weakness and diagnized with R basal ganglia stroke; left AMA to come home where her doctor in [place]  recommended she come directly to the hospital.. MRI brain demonstrates R basal ganglia infarct. CTA head/neck revealed multifocal intracranial atherosclerotic disease including moderate right M1 MCA stenosis and severe stenoses in other vessels. PMH significant for HTN, HLD, DMII.   OT comments  Patient received in supine with son present. Patient agreeable to OT treatment and asking for shower. Patient able to get to EOB with CGA and ambulated to bathroom with rollator walker and min to CGA. Patient able to doff clothing with min assist and transferred into walk in shower with min assist and cues for grab bars use and safety. Patient able to bathe self seated in shower with supervision for UB and min assist for LB. Patient required cues for sequencing and min assist for dressing. Patient completed session up in recliner with setup for breakfast. Patient will benefit from intensive inpatient follow-up therapy, >3 hours/day following discharge from Acute OT. Acute OT to continue to follow to address established goals to facilitate DC to next venue of care.        If plan is discharge home, recommend the following:  A little help with walking and/or transfers;A little help with bathing/dressing/bathroom;Assistance with cooking/housework;Assist for transportation;Help with stairs or ramp for entrance;Direct supervision/assist for financial management;Direct supervision/assist for medications management   Equipment Recommendations  Other (comment) (defer)    Recommendations for Other Services      Precautions / Restrictions  Precautions Precautions: Fall Restrictions Weight Bearing Restrictions Per Provider Order: No       Mobility Bed Mobility Overal bed mobility: Needs Assistance Bed Mobility: Supine to Sit     Supine to sit: Contact guard     General bed mobility comments: CGA with trunk    Transfers Overall transfer level: Needs assistance Equipment used: Rollator (4 wheels) Transfers: Sit to/from Stand Sit to Stand: Min assist           General transfer comment: min assist to power up and min to CGA for transfers with cue for brake use and safety     Balance Overall balance assessment: Needs assistance Sitting-balance support: No upper extremity supported, Feet supported Sitting balance-Leahy Scale: Good     Standing balance support: Single extremity supported, Bilateral upper extremity supported, No upper extremity supported, During functional activity Standing balance-Leahy Scale: Poor Standing balance comment: CGA to stand with one to two extremity support, min assist with no UE support during self care tasks                           ADL either performed or assessed with clinical judgement   ADL Overall ADL's : Needs assistance/impaired Eating/Feeding: Set up;Sitting Eating/Feeding Details (indicate cue type and reason): uses RUE to feed self, requires setup Grooming: Wash/dry hands;Wash/dry face;Contact guard assist;Standing Grooming Details (indicate cue type and reason): at sink Upper Body Bathing: Supervision/ safety;Sitting;Cueing for sequencing Upper Body Bathing Details (indicate cue type and reason): in shower Lower Body Bathing: Minimal assistance;Sit to/from stand;Cueing for sequencing   Upper Body Dressing : Minimal assistance;Sitting Upper Body Dressing Details (indicate cue type and reason): to donn pullover top  Lower Body Dressing: Minimal assistance;Sit to/from stand Lower Body Dressing Details (indicate cue type and reason): patient donned pullup  briefs and socks with min assist         Tub/ Shower Transfer: Walk-in shower;Minimal assistance;Shower seat;Grab Investment banker, operational Details (indicate cue type and reason): cues for safety and grab bars use Functional mobility during ADLs: Minimal assistance;Contact guard assist;Rollator (4 wheels) General ADL Comments: required cues for safety and sequencing. Son present and encouraged to allow patient to perform tasks    Extremity/Trunk Assessment Upper Extremity Assessment Upper Extremity Assessment: Right hand dominant LUE Deficits / Details: decr coordination speed as well as generally weak. 4-/5. poor initiation as compared to R. Decr attention to L side at times. LUE Sensation: decreased light touch;decreased proprioception LUE Coordination: decreased fine motor;decreased gross motor            Diplomatic Services operational officer Communication Communication: Impaired Factors Affecting Communication: Reduced clarity of speech   Cognition Arousal: Alert Behavior During Therapy: WFL for tasks assessed/performed, Flat affect Cognition: Cognition impaired     Awareness: Intellectual awareness intact, Online awareness impaired Memory impairment (select all impairments): Short-term memory Attention impairment (select first level of impairment): Sustained attention, Selective attention Executive functioning impairment (select all impairments): Sequencing, Problem solving, Reasoning OT - Cognition Comments: poor safety with patient attempting to stand without assistance                 Following commands: Impaired Following commands impaired: Follows one step commands with increased time, Follows multi-step commands inconsistently      Cueing   Cueing Techniques: Verbal cues, Tactile cues, Gestural cues  Exercises      Shoulder Instructions       General Comments      Pertinent Vitals/ Pain       Pain Assessment Pain Assessment:  Faces Faces Pain Scale: No hurt Pain Intervention(s): Monitored during session  Home Living                                          Prior Functioning/Environment              Frequency  Min 2X/week        Progress Toward Goals  OT Goals(current goals can now be found in the care plan section)  Progress towards OT goals: Progressing toward goals  Acute Rehab OT Goals Patient Stated Goal: get better OT Goal Formulation: With patient Time For Goal Achievement: 07/07/24 Potential to Achieve Goals: Good ADL Goals Pt Will Perform Eating: with modified independence;sitting Pt Will Perform Grooming: with modified independence;standing Pt Will Perform Upper Body Dressing: with modified independence;sitting Pt Will Perform Lower Body Dressing: with modified independence;sit to/from stand Pt Will Transfer to Toilet: with modified independence;ambulating Pt/caregiver will Perform Home Exercise Program: Increased strength;Left upper extremity Additional ADL Goal #1: Pt will follow 3 step commands during ADL with no more than min cues.  Plan      Co-evaluation                 AM-PAC OT 6 Clicks Daily Activity     Outcome Measure   Help from another person eating meals?: A Little Help from another person taking care of personal grooming?: A Little Help from another person toileting, which includes using toliet,  bedpan, or urinal?: A Little Help from another person bathing (including washing, rinsing, drying)?: A Little Help from another person to put on and taking off regular upper body clothing?: A Little Help from another person to put on and taking off regular lower body clothing?: A Little 6 Click Score: 18    End of Session Equipment Utilized During Treatment: Rollator (4 wheels)  OT Visit Diagnosis: Unsteadiness on feet (R26.81);Muscle weakness (generalized) (M62.81);Other symptoms and signs involving cognitive function;Hemiplegia and  hemiparesis Hemiplegia - Right/Left: Left Hemiplegia - dominant/non-dominant: Non-Dominant Hemiplegia - caused by: Cerebral infarction   Activity Tolerance Patient tolerated treatment well   Patient Left in chair;with call bell/phone within reach;with chair alarm set;with family/visitor present   Nurse Communication Mobility status        Time: 9198-9169 OT Time Calculation (min): 29 min  Charges: OT General Charges $OT Visit: 1 Visit OT Treatments $Self Care/Home Management : 23-37 mins  Dick Laine, OTA Acute Rehabilitation Services  Office [phone]   Jeb LITTIE Laine 06/25/2024, 11:06 AM

## 2024-06-25 NOTE — PMR Pre-admission (Signed)
 PMR Admission Coordinator Pre-Admission Assessment  Patient: Monica Austin is an 68 y.o., female MRN: [id number] DOB: 1956/07/26 Height: 5' 7 (170.2 cm) Weight: 84 kg  Insurance Information HMO: ***    PPO: ***     PCP:      IPA:      80/20:      OTHER:  PRIMARY: BCBS Comm PPO       Policy#: [id number]      Subscriber: pt CM Name: ***      Phone#: ***     Fax#: *** Pre-Cert#: ***      Employer: *** Benefits:  Phone #: ***     Name: *** Eff. Date: ***     Deduct: ***      Out of Pocket Max: ***      Life Max: *** CIR: ***      SNF: *** Outpatient: ***     Co-Pay: *** Home Health: ***      Co-Pay: *** DME: ***     Co-Pay: *** Providers:  SECONDARY:  Policy#:      Phone#:   Financial Counselor:       Phone#:   The "Data Collection Information Summary" for patients in Inpatient Rehabilitation Facilities with attached "Privacy Act Statement-Health Care Records" was provided and verbally reviewed with: Patient and Family  Emergency Contact Information Contact Information     Name Relation Home Work Mobile   Kingry,Brandon          Other Contacts   None on File     Current Medical History  Patient Admitting Diagnosis: CVA   History of Present Illness: Pt is a 68 y/o female with PMH of HTN, HLD, and DM who presented to [facility] on 6/30 with L hemiparesis.  Reportedly she was admitted to hospital in [facility] for same, but left against medical advise to return to [facility] for further workup.  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  EF noted to be 60-65% with diastolic dysfunction.  At [facility], she was noted to have BP 156/95, BUN 16, creatinine 1.01, glucose 253, Hgb A1C 9.7, UA consistent with UTI.  NIHSS on admit 7.  Recommendations are for aspirin and plavix x90 days, then aspirin alone.  Hospital course DM management, and abx for UTI.  Therapy evaluations were completed and pt was  recommended for CIR.   Complete NIHSS TOTAL: 6  Patient's medical record from [facility] has been reviewed by the rehabilitation admission coordinator and physician.  Past Medical History  Past Medical History:  Diagnosis Date   Fatigue    Hyperlipidemia    Hypertension    NIDDM (non-insulin dependent diabetes mellitus)    Borderline    Has the patient had major surgery during 100 days prior to admission? No  Family History   family history includes COPD in her mother; Cancer in her father; Colon polyps in her sister.  Current Medications  Current Facility-Administered Medications:    acetaminophen (TYLENOL) tablet 650 mg, 650 mg, Oral, Q6H PRN, 650 mg at 06/24/24 2211 **OR** acetaminophen (TYLENOL) suppository 650 mg, 650 mg, Rectal, Q6H PRN, Smith, Rondell A, MD   albuterol (PROVENTIL) (2.5 MG/3ML) 0.083% nebulizer solution 2.5 mg, 2.5 mg, Nebulization, Q6H PRN, Claudene, Rondell A, MD   aspirin EC tablet 81 mg, 81 mg, Oral, Daily, Claudene, Rondell A, MD, 81 mg at 06/25/24 0955   [  START ON 06/26/2024] cephALEXin (KEFLEX) capsule 500 mg, 500 mg, Oral, Q8H, Ghimire, Kuber, MD   clopidogrel (PLAVIX) tablet 75 mg, 75 mg, Oral, Daily, Claudene, Rondell A, MD, 75 mg at 06/25/24 0955   enoxaparin (LOVENOX) injection 40 mg, 40 mg, Subcutaneous, Q24H, Smith, Rondell A, MD, 40 mg at 06/25/24 0957   hydrALAZINE (APRESOLINE) injection 10 mg, 10 mg, Intravenous, Q4H PRN, Claudene, Rondell A, MD   insulin aspart (novoLOG) injection 0-15 Units, 0-15 Units, Subcutaneous, TID WC, Smith, Rondell A, MD, 5 Units at 06/25/24 0957   insulin aspart (novoLOG) injection 0-5 Units, 0-5 Units, Subcutaneous, QHS, Smith, Rondell A, MD, 2 Units at 06/24/24 2152   insulin glargine-yfgn (SEMGLEE) injection 12 Units, 12 Units, Subcutaneous, Daily, Ghimire, Kuber, MD, 12 Units at 06/25/24 0956   metFORMIN (GLUCOPHAGE) tablet 850 mg, 850 mg, Oral, BID WC, Ghimire, Kuber, MD, 850 mg at 06/25/24 0956   metoprolol succinate  (TOPROL-XL) 24 hr tablet 25 mg, 25 mg, Oral, Daily, Smith, Rondell A, MD, 25 mg at 06/25/24 0955   montelukast (SINGULAIR) tablet 10 mg, 10 mg, Oral, QHS, Smith, Rondell A, MD, 10 mg at 06/24/24 2131   ondansetron (ZOFRAN) tablet 4 mg, 4 mg, Oral, Q6H PRN **OR** ondansetron (ZOFRAN) injection 4 mg, 4 mg, Intravenous, Q6H PRN, Smith, Rondell A, MD   rosuvastatin (CRESTOR) tablet 20 mg, 20 mg, Oral, QPM, Smith, Rondell A, MD, 20 mg at 06/24/24 1859   sodium chloride  flush (NS) 0.9 % injection 3 mL, 3 mL, Intravenous, Q12H, Smith, Rondell A, MD, 3 mL at 06/25/24 9041  Patients Current Diet:  Diet Order             DIET DYS 3 Room service appropriate? Yes with Assist; Fluid consistency: Thin  Diet effective now                   Precautions / Restrictions Precautions Precautions: Fall Restrictions Weight Bearing Restrictions Per Provider Order: No   Has the patient had 2 or more falls or a fall with injury in the past year? 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 Independent  Indoor Mobility: Did the patient need assistance with walking from room to room (with or without device)? Independent  Stairs: Did the patient need assistance with internal or external stairs (with or without device)? Independent  Functional Cognition: Did the patient need help planning regular tasks such as shopping or remembering to take medications? Independent  Patient Information Are you of Hispanic, Latino/a,or Spanish origin?: A. No, not of Hispanic, Latino/a, or Spanish origin What is your race?: B. Black or African American Do you need or want an interpreter to communicate with a doctor or health care staff?: 0. No  Patient's Response To:  Health Literacy and Transportation Is the patient able to respond  to health literacy and transportation needs?: Yes Health Literacy - How often do you need to have someone help you when you read instructions, pamphlets, or other written material from your doctor or pharmacy?: Never In the past 12 months, has lack of transportation kept you from medical appointments or from getting medications?: No In the past 12 months, has lack of transportation kept you from meetings, work, or from getting things needed for daily living?: No  Journalist, newspaper / Equipment Home Equipment: Grab bars -  tub/shower, Hand held shower head  Prior Device Use: Indicate devices/aids used by the patient prior to current illness, exacerbation or injury? None of the above  Current Functional Level Cognition  Arousal/Alertness: Awake/alert Overall Cognitive Status: Impaired/Different from baseline Orientation Level: Oriented X4 Attention: Sustained Sustained Attention: Appears intact Memory: Impaired Memory Impairment: Decreased recall of new information Awareness: Impaired Awareness Impairment: Intellectual impairment, Other (comment) (online awareness) Problem Solving: Impaired Problem Solving Impairment: Verbal basic Safety/Judgment: Impaired    Extremity Assessment (includes Sensation/Coordination)  Upper Extremity Assessment: Right hand dominant LUE Deficits / Details: decr coordination speed as well as generally weak. 4-/5. poor initiation as compared to R. Decr attention to L side at times. LUE Sensation: decreased light touch, decreased proprioception LUE Coordination: decreased fine motor, decreased gross motor  Lower Extremity Assessment: LLE deficits/detail LLE Deficits / Details: grossly 3/5    ADLs  Overall ADL's : Needs assistance/impaired Eating/Feeding: Set up, Sitting Eating/Feeding Details (indicate cue type and reason): uses RUE to feed self, requires setup Grooming: Wash/dry hands, Wash/dry face, Contact guard assist, Standing Grooming Details  (indicate cue type and reason): at sink Upper Body Bathing: Supervision/ safety, Sitting, Cueing for sequencing Upper Body Bathing Details (indicate cue type and reason): in shower Lower Body Bathing: Minimal assistance, Sit to/from stand, Cueing for sequencing Upper Body Dressing : Minimal assistance, Sitting Upper Body Dressing Details (indicate cue type and reason): to donn pullover top Lower Body Dressing: Minimal assistance, Sit to/from stand Lower Body Dressing Details (indicate cue type and reason): patient donned pullup briefs and socks with min assist Toilet Transfer: Minimal assistance, Ambulation, Rolling walker (2 wheels) Toilet Transfer Details (indicate cue type and reason): CGA progressing to min A Tub/ Shower Transfer: Walk-in shower, Minimal assistance, Shower seat, Engineer, technical sales Details (indicate cue type and reason): cues for safety and grab bars use Functional mobility during ADLs: Minimal assistance, Contact guard assist, Rollator (4 wheels) General ADL Comments: required cues for safety and sequencing. Son present and encouraged to allow patient to perform tasks    Mobility  Overal bed mobility: Needs Assistance Bed Mobility: Supine to Sit Supine to sit: Contact guard Sit to supine: Supervision General bed mobility comments: CGA with trunk    Transfers  Overall transfer level: Needs assistance Equipment used: Rollator (4 wheels) Transfers: Sit to/from Stand Sit to Stand: Min assist General transfer comment: min assist to power up and min to CGA for transfers with cue for brake use and safety    Ambulation / Gait / Stairs / Wheelchair Mobility  Ambulation/Gait Ambulation/Gait assistance: Min assist, +2 safety/equipment Gait Distance (Feet): 75 Feet (75 feet x 2) Assistive device: Rollator (4 wheels) Gait Pattern/deviations: Step-through pattern, Decreased step length - left, Decreased stride length, Decreased stance time - left, Decreased  dorsiflexion - left, Drifts right/left General Gait Details: Pt was able to ambulate with rollator needing cues to sequence steps and rollator. Pt at times needed cues to step left LE as she shortens step length.  Appears to have decr attention to left side with poor awareness of where left UE was as well as not tracking fully to left without cuing. Pt also impulsive and walking too quickly intiialy.  Pt did walk to the bathroom and tried to urinate but couldnt go. Gait velocity interpretation: <1.8 ft/sec, indicate of risk for recurrent falls    Posture / Balance Dynamic Sitting Balance Sitting balance - Comments: statically Balance Overall balance assessment: Needs assistance Sitting-balance support: No upper extremity supported, Feet  supported Sitting balance-Leahy Scale: Good Sitting balance - Comments: statically Standing balance support: Single extremity supported, Bilateral upper extremity supported, No upper extremity supported, During functional activity Standing balance-Leahy Scale: Poor Standing balance comment: CGA to stand with one to two extremity support, min assist with no UE support during self care tasks    Special needs/care consideration Diabetic management yes   Previous Home Environment (from acute therapy documentation) Living Arrangements: Other relatives  Lives With: Family Available Help at Discharge: Family, Available 24 hours/day Type of Home: House (has a house in [place], which her grandson also stays at, but she also goes to [place] for periods of time to work and she stays with her daughter while up there) Home Layout: One level, Laundry or work area in basement Home Access: Stairs to enter Entrance Stairs-Rails: Right, Left Entrance Stairs-Number of Steps: 6-7 Bathroom Shower/Tub: Engineer, manufacturing systems: Standard (comfort) Home Care Services: No  Discharge Living Setting Plans for Discharge Living Setting: Patient's home (daughter, Delaine to  stay with her as well as grandson) Type of Home at Discharge: House Discharge Home Layout: One level Discharge Home Access: Stairs to enter Entrance Stairs-Rails: Right, Left Entrance Stairs-Number of Steps: 6-7 Discharge Bathroom Shower/Tub: Tub/shower unit Discharge Bathroom Toilet: Handicapped height Discharge Bathroom Accessibility: Yes How Accessible: Accessible via walker Does the patient have any problems obtaining your medications?: No  Social/Family/Support Systems Anticipated Caregiver: daughter, Delaine [phone] Anticipated Caregiver's Contact Information: see above Ability/Limitations of Caregiver: none stated, she is taking FMLA to provide assist for mom Caregiver Availability: 24/7 Discharge Plan Discussed with Primary Caregiver: Yes Is Caregiver In Agreement with Plan?: Yes Does Caregiver/Family have Issues with Lodging/Transportation while Pt is in Rehab?: No  Goals Patient/Family Goal for Rehab: PT/OT/SLP supervision to mod I Expected length of stay: 10-14 days Additional Information: Discharge plan: pt to discharge to her home at supervision/mod I level, daughter Delaine planning to come to stay with her at discharge and can provide 24/7 (she will take FMLA) Pt/Family Agrees to Admission and willing to participate: Yes Program Orientation Provided & Reviewed with Pt/Caregiver Including Roles  & Responsibilities: Yes  Decrease burden of Care through IP rehab admission: n/a  Possible need for SNF placement upon discharge: No.  Plan for discharge to pt's home at supervision to mod I level.  Daughter to come from [place] and take FMLA to stay with pt.    Patient Condition: I have reviewed medical records from [facility], spoken with CM, and patient and daughter. I met with patient at the bedside and discussed via phone for inpatient rehabilitation assessment.  Patient will benefit from ongoing PT, OT, and SLP, can actively participate in 3 hours of therapy a day 5 days  of the week, and can make measurable gains during the admission.  Patient will also benefit from the coordinated team approach during an Inpatient Acute Rehabilitation admission.  The patient will receive intensive therapy as well as Rehabilitation physician, nursing, social worker, and care management interventions.  Due to safety, skin/wound care, disease management, medication administration, pain management, and patient education the patient requires 24 hour a day rehabilitation nursing.  The patient is currently min assist with mobility and basic ADLs.  Discharge setting and therapy post discharge at home with home health is anticipated.  Patient has agreed to participate in the Acute Inpatient Rehabilitation Program and will admit pending insurance approval ***.  Preadmission Screen Completed By:  Reche FORBES Lowers, 06/25/2024 11:59 AM ______________________________________________________________________   Discussed status  with Dr. PIERRETTE on *** at *** and received approval for admission today.  Admission Coordinator:  Caitlin E Warren, PT, time PIERRETTEPattricia ***   Assessment/Plan: Diagnosis: *** Does the need for close, 24 hr/day Medical supervision in concert with the patient's rehab needs make it unreasonable for this patient to be served in a less intensive setting? {yes_no_potentially:3041433} Co-Morbidities requiring supervision/potential complications: *** Due to {due un:6958565}, does the patient require 24 hr/day rehab nursing? {yes_no_potentially:3041433} Does the patient require coordinated care of a physician, rehab nurse, PT, OT, and SLP to address physical and functional deficits in the context of the above medical diagnosis(es)? {yes_no_potentially:3041433} Addressing deficits in the following areas: {deficits:3041436} Can the patient actively participate in an intensive therapy program of at least 3 hrs of therapy 5 days a week? {yes_no_potentially:3041433} The potential for patient to  make measurable gains while on inpatient rehab is {potential:3041437} Anticipated functional outcomes upon discharge from inpatient rehab: {functional outcomes:304600100} PT, {functional outcomes:304600100} OT, {functional outcomes:304600100} SLP Estimated rehab length of stay to reach the above functional goals is: *** Anticipated discharge destination: {anticipated dc setting:21604} 10. Overall Rehab/Functional Prognosis: {potential:3041437}   MD Signature: ***

## 2024-06-25 NOTE — Progress Notes (Signed)
 Physical Therapy Treatment Patient Details Name: Monica Austin MRN: [id number] DOB: 20-May-1956 Today's Date: 06/25/2024   History of Present Illness Pt is a 68 y.o. female presenting 6/30 with L sided weakness while on vacation in [place].  She was treated in [facility] and diagnosed with R basal ganglia stroke.Pt left [place] treatment facility A[place] to come home where her doctor in [place]  recommended she come directly to the hospital.  MRI brain demonstrates R basal ganglia infarct. CTA head/neck revealed multifocal intracranial atherosclerotic disease including moderate right M1 MCA stenosis and severe stenoses in other vessels. PMH significant for HTN, HLD, DMII.    PT Comments  Pt is very motivated.  Limited due to weakness, inattention and impulsivity.  Cues for safety throughout.  Continues to recommend aggressive rehab in a post acute setting to maximize functional gains before returning home.      If plan is discharge home, recommend the following: A little help with walking and/or transfers;A little help with bathing/dressing/bathroom;Assistance with cooking/housework;Assist for transportation;Help with stairs or ramp for entrance;Supervision due to cognitive status   Can travel by private vehicle        Equipment Recommendations  Other (comment) (TBA)    Recommendations for Other Services Rehab consult     Precautions / Restrictions Precautions Precautions: Fall Precaution/Restrictions Comments: L inattention Restrictions Weight Bearing Restrictions Per Provider Order: No     Mobility  Bed Mobility Overal bed mobility: Needs Assistance Bed Mobility: Supine to Sit, Sit to Supine     Supine to sit: Contact guard Sit to supine: Contact guard assist   General bed mobility comments: CGA with trunk to rise into sitting.  Pt required increased time and effort to scoot to edge of bed to prepare for t/f training.    Transfers Overall transfer level: Needs assistance Equipment used:  Rollator (4 wheels) Transfers: Sit to/from Stand Sit to Stand: Min assist           General transfer comment: min assist to power up and max VCs for locking rollator breaks buring sit<>stand transfers,  Presents with poor eccentric load and not backing fully to seated surface before sitting.    Ambulation/Gait Ambulation/Gait assistance: Min assist, +2 safety/equipment Gait Distance (Feet): 60 Feet (x3 trials with standing rest breaks to readjust in rollator and avoid pushing device too far forward.) Assistive device: Rollator (4 wheels) Gait Pattern/deviations: Step-through pattern, Decreased step length - left, Decreased stride length, Decreased stance time - left, Decreased dorsiflexion - left, Drifts right/left       General Gait Details: Pt was able to ambulate with rollator needing cues to increased BOS and stay close to rollator. Pt at times needed cues to step left LE as she shortens step length.  Appears to have decr attention to left side with poor awareness of where left UE was as well as not tracking fully to left without cuing. Pt also impulsive and walking too quickly intiialy.  Cues to slow down gt speed and promote safety.  Poor balance noted with heavy min assistance for righting.   Stairs             Wheelchair Mobility     Tilt Bed    Modified Rankin (Stroke Patients Only)       Balance Overall balance assessment: Needs assistance Sitting-balance support: No upper extremity supported, Feet supported Sitting balance-Leahy Scale: Good Sitting balance - Comments: statically     Standing balance-Leahy Scale: Poor  Communication Communication Communication: Impaired Factors Affecting Communication: Reduced clarity of speech  Cognition Arousal: Alert Behavior During Therapy: WFL for tasks assessed/performed, Flat affect                             Following commands: Impaired Following commands  impaired: Follows one step commands with increased time, Follows multi-step commands inconsistently    Cueing Cueing Techniques: Verbal cues, Tactile cues, Gestural cues  Exercises General Exercises - Lower Extremity Hip Flexion/Marching: AROM, Both, Standing Heel Raises: Both, AROM, 10 reps, Standing Mini-Sqauts: AROM, Both, 10 reps, Standing    General Comments        Pertinent Vitals/Pain Pain Assessment Pain Assessment: Faces Faces Pain Scale: No hurt    Home Living                          Prior Function            PT Goals (current goals can now be found in the care plan section) Acute Rehab PT Goals Patient Stated Goal: to get stronger and then back to [place]  Potential to Achieve Goals: Good Progress towards PT goals: Progressing toward goals    Frequency    Min 3X/week      PT Plan      Co-evaluation              AM-PAC PT 6 Clicks Mobility   Outcome Measure  Help needed turning from your back to your side while in a flat bed without using bedrails?: A Little Help needed moving from lying on your back to sitting on the side of a flat bed without using bedrails?: A Little Help needed moving to and from a bed to a chair (including a wheelchair)?: A Little Help needed standing up from a chair using your arms (e.g., wheelchair or bedside chair)?: A Little Help needed to walk in hospital room?: A Little Help needed climbing 3-5 steps with a railing? : A Little 6 Click Score: 18    End of Session Equipment Utilized During Treatment: Gait belt Activity Tolerance: Patient limited by fatigue Patient left: with call bell/phone within reach;with bed alarm set;in bed Nurse Communication: Mobility status PT Visit Diagnosis: Unsteadiness on feet (R26.81);Muscle weakness (generalized) (M62.81);Hemiplegia and hemiparesis Hemiplegia - Right/Left: Left Hemiplegia - dominant/non-dominant: Non-dominant Hemiplegia - caused by: Nontraumatic  intracerebral hemorrhage     Time: 8485-8461 PT Time Calculation (min) (ACUTE ONLY): 24 min  Charges:    $Gait Training: 8-22 mins $Therapeutic Exercise: 8-22 mins PT General Charges $$ ACUTE PT VISIT: 1 Visit                     Monica Austin , PTA Acute Rehabilitation Services Office [phone]    Monica Austin 06/25/2024, 3:56 PM

## 2024-06-25 NOTE — Progress Notes (Signed)
 Inpatient Rehab Admissions Coordinator:   I spoke to pt's daughter, Delaine [phone]) this morning to review CIR recommendations and goals/expectations of CIR stay.  We reviewed insurance auth process and I will start that request today.  Delaine is planning to come down Friday and will stay with pt at discharge from CIR.    Reche Lowers, PT, DPT Admissions Coordinator [phone] 06/25/24  11:49 AM

## 2024-06-25 NOTE — Plan of Care (Signed)
   Problem: Education: Goal: Ability to describe self-care measures that may prevent or decrease complications (Diabetes Survival Skills Education) will improve Outcome: Progressing Goal: Individualized Educational Video(s) Outcome: Progressing

## 2024-06-25 NOTE — Progress Notes (Signed)
 PROGRESS NOTE    Monica Austin  FMW:[id number] DOB: 1956-05-02 DOA: 06/23/2024 PCP: Pcp, No    Brief Narrative:  68 year old with history of hypertension, hyperlipidemia, type 2 diabetes presented to the hospital with 3 days of left facial droop and weakness.  Patient was vacationing in [place] , had acute a stroke 6/27 and was admitted to the local hospital.  She had a stroke workup done that showed 2.5 cm acute infarct in the right basal ganglia.  She left AGAINST MEDICAL ADVICE and came to the [facility].  Admitted to complete the stroke workup and rehab.  Seen by neurology.  Subjective: Seen and examined . No new events . She thinks her walking is getting better.  Assessment & Plan:   Acute right MCA stroke: Clinical findings, left-sided weakness, dysphagia and left facial droop. MRI of the brain, right basal ganglia stroke CT angiogram head and neck, multifocal intracranial atherosclerotic disease. 2D echocardiogram, EF 60%.  No intracardiac thrombus. Antiplatelet therapy, LDL 71. Hemoglobin A1c, 9.7. Patient had completed stroke workup at outside hospital before coming to the emergency room.  Seen by neurology.  Recommendations are Aspirin Plavix for 90 days and continue aspirin alone.  Will send outpatient neurology follow-up. Seen by PT OT, will refer to acute inpatient rehab. Speech therapy continues to follow. Glycemic control as below.  Type 2 diabetes, uncontrolled with hyperglycemia: At home on metformin 850 mg daily.  A1c 9.7.  Increase metformin 850 mg twice daily Increase long-acting insulin to 12 units daily today.  Will need further titration.   Essential hypertension blood pressure is elevated.  Metoprolol restarted.  Hyperlipidemia: Crestor 20 mg daily to continue.  Acute UTI present on admission: Growing E. coli.  Rocephin x 3.  Keflex 500 mg 3 times daily for 4 more days.   Medically stabilized.  Waiting for rehab bed.   DVT prophylaxis: enoxaparin  (LOVENOX) injection 40 mg Start: 06/23/24 1000   Code Status: Full code Family Communication: None at the bedside Disposition Plan: Status is: Inpatient.  Medically stable.  Waiting for rehab bed.     Consultants:  Neurology  Procedures:  None  Antimicrobials:  Rocephin 6/30--7/2 Keflex 7/2---     Objective: Vitals:   06/24/24 1945 06/25/24 0023 06/25/24 0417 06/25/24 0915  BP: (!) 148/79 129/70 135/70 (!) 158/73  Pulse: 66 (!) 58 (!) 58 62  Resp: 18 18 17    Temp: 98.7 F (37.1 C) (!) 97.5 F (36.4 C) 97.6 F (36.4 C)   TempSrc:      SpO2: 99% 98% 97% 100%  Weight:      Height:       No intake or output data in the 24 hours ending 06/25/24 1003  Filed Weights   06/23/24 2018  Weight: 84 kg    Examination:  General exam: Appears calm and comfortable.  Pleasant interactive. Respiratory system: Clear to auscultation. Respiratory effort normal. Cardiovascular system: S1 & S2 heard, RRR. No pedal edema. Gastrointestinal system: Abdomen is nondistended, soft and nontender. No organomegaly or masses felt. Normal bowel sounds heard. Central nervous system: Alert and oriented.  Left facial droop present. Left upper and lower extremity with mildly reduced power as compared to right but mostly normal strength.      Data Reviewed: I have personally reviewed following labs and imaging studies  CBC: Recent Labs  Lab 06/23/24 0327 06/24/24 0341  WBC 7.9 6.1  NEUTROABS 3.9  --   HGB 13.4 12.3  HCT 39.3 36.4  MCV  83.3 83.5  PLT 246 239   Basic Metabolic Panel: Recent Labs  Lab 06/23/24 0327 06/24/24 0341  NA 137 138  K 3.8 3.7  CL 103 106  CO2 24 25  GLUCOSE 253* 160*  BUN 16 14  CREATININE 1.01* 0.91  CALCIUM 9.6 9.2   GFR: Estimated Creatinine Clearance: 65 mL/min (by C-G formula based on SCr of 0.91 mg/dL). Liver Function Tests: Recent Labs  Lab 06/23/24 0327  AST 21  ALT 20  ALKPHOS 72  BILITOT 0.5  PROT 7.4  ALBUMIN 3.8   No results  for input(s): LIPASE, AMYLASE in the last 168 hours. No results for input(s): AMMONIA in the last 168 hours. Coagulation Profile: Recent Labs  Lab 06/23/24 0327  INR 0.9   Cardiac Enzymes: No results for input(s): CKTOTAL, CKMB, CKMBINDEX, TROPONINI in the last 168 hours. BNP (last 3 results) No results for input(s): PROBNP in the last 8760 hours. HbA1C: Recent Labs    06/23/24 0630  HGBA1C 9.7*   CBG: Recent Labs  Lab 06/24/24 0755 06/24/24 1135 06/24/24 1634 06/24/24 1947 06/25/24 0914  GLUCAP 233* 163* 174* 230* 245*   Lipid Profile: No results for input(s): CHOL, HDL, LDLCALC, TRIG, CHOLHDL, LDLDIRECT in the last 72 hours. Thyroid Function Tests: No results for input(s): TSH, T4TOTAL, FREET4, T3FREE, THYROIDAB in the last 72 hours. Anemia Panel: No results for input(s): VITAMINB12, FOLATE, FERRITIN, TIBC, IRON, RETICCTPCT in the last 72 hours. Sepsis Labs: No results for input(s): PROCALCITON, LATICACIDVEN in the last 168 hours.  Recent Results (from the past 240 hours)  Urine Culture (for pregnant, neutropenic or urologic patients or patients with an indwelling urinary catheter)     Status: Abnormal   Collection Time: 06/23/24  3:11 AM   Specimen: Urine, Clean Catch  Result Value Ref Range Status   Specimen Description URINE, CLEAN CATCH  Final   Special Requests   Final    NONE Performed at [facility], [address], [place], [place] [address]    Culture >=100,000 COLONIES/mL ESCHERICHIA COLI (A)  Final   Report Status 06/25/2024 FINAL  Final   Organism ID, Bacteria ESCHERICHIA COLI (A)  Final      Susceptibility   Escherichia coli - MIC*    AMPICILLIN <=2 SENSITIVE Sensitive     CEFAZOLIN <=4 SENSITIVE Sensitive     CEFEPIME <=0.12 SENSITIVE Sensitive     CEFTRIAXONE <=0.25 SENSITIVE Sensitive     CIPROFLOXACIN <=0.25 SENSITIVE Sensitive     GENTAMICIN <=1 SENSITIVE Sensitive     IMIPENEM  <=0.25 SENSITIVE Sensitive     NITROFURANTOIN <=16 SENSITIVE Sensitive     TRIMETH/SULFA <=20 SENSITIVE Sensitive     AMPICILLIN/SULBACTAM <=2 SENSITIVE Sensitive     PIP/TAZO <=4 SENSITIVE Sensitive ug/mL    * >=100,000 COLONIES/mL ESCHERICHIA COLI         Radiology Studies: DG Swallowing Func-Speech Pathology Result Date: 06/24/2024 Table formatting from the original result was not included. Modified Barium Swallow Study Patient Details Name: Monica Austin MRN: [id number] Date of Birth: Mar 26, 1956 Today's Date: 06/24/2024 HPI/PMH: HPI: 68 yo recently admitted to hospital in [facility] while on vacation and dx with R basal ganglia stroke. SLP clinical swallow eval completed there on 06/22/24 reported impaired mastication, and IDDSI diet level 7 (easy to chew) was recommended. No overt pharyngeal symptoms observed but subjectively pt reported that her saliva felt like it went down the wrong way intermittently so MBS was recommended. Report not available so not sure  if this was completed prior to pt leaving AMA 6/29, driving to [place] with family and reporting difficulty swallowing along the way. She presented directly to [facility] at the recommendation of MD in [facility]. PMH significant for HTN, HLD, DMII. Clinical Impression: Clinical Impression: Pt presents with an oral more than pharyngeal dysphagia. She has reduced labial seal, slow mastication, and delayed lingual transport. Oral deficits lead to premature loss of the bolus into the pharynx, with thin and nectar thick liquids also entering the laryngeal vestibule before the swallow. She has pretty good protective mechanisms of her airway, so that even when larger amounts of penetration occur (like with thin liquids via straw), she can almost always clear her laryngeal vestibule. If it does not occur immediately, it does on subsequent swallow. A chin tuck helped to reduce the volume of penetration compared to straw sips in a neutral position, but did not prevent  penetration. Recommend that she continue with Dys 3 (mechanical soft) solids and thin liquids. If coughing persists at meal times, could consider further modifications (remove straws, try chin tuck, or thicken to nectar). Would still offer pills with puree as pt had difficulty clearing the barium tablet from her oral cavity. When she ultimately did, it was transiently retained in the esophagus before clearing with a liquid wash. Factors that may increase risk of adverse event in presence of aspiration Noe & Lianne 2021): Factors that may increase risk of adverse event in presence of aspiration Noe & Lianne 2021): Reduced cognitive function; Limited mobility Recommendations/Plan: Swallowing Evaluation Recommendations Swallowing Evaluation Recommendations Recommendations: PO diet PO Diet Recommendation: Dysphagia 3 (Mechanical soft); Thin liquids (Level 0) Liquid Administration via: Cup; Straw Medication Administration: Whole meds with puree Supervision: Patient able to self-feed; Intermittent supervision/cueing for swallowing strategies; Set-up assistance for safety Swallowing strategies  : Slow rate; Small bites/sips; Check for anterior loss Postural changes: Position pt fully upright for meals Oral care recommendations: Oral care BID (2x/day) Treatment Plan Treatment Plan Treatment recommendations: Therapy as outlined in treatment plan below Follow-up recommendations: Acute inpatient rehab (3 hours/day) Functional status assessment: Patient has had a recent decline in their functional status and demonstrates the ability to make significant improvements in function in a reasonable and predictable amount of time. Treatment frequency: Min 2x/week Treatment duration: 2 weeks Interventions: Aspiration precaution training; Compensatory techniques; Patient/family education; Trials of upgraded texture/liquids; Diet toleration management by SLP Recommendations Recommendations for follow up therapy are one  component of a multi-disciplinary discharge planning process, led by the attending physician.  Recommendations may be updated based on patient status, additional functional criteria and insurance authorization. Assessment: Orofacial Exam: Orofacial Exam Oral Cavity - Dentition: Adequate natural dentition Anatomy: Anatomy: WFL Boluses Administered: Boluses Administered Boluses Administered: Thin liquids (Level 0); Mildly thick liquids (Level 2, nectar thick); Moderately thick liquids (Level 3, honey thick); Puree; Solid  Oral Impairment Domain: Oral Impairment Domain Lip Closure: Interlabial escape, no progression to anterior lip Tongue control during bolus hold: Cohesive bolus between tongue to palatal seal Bolus preparation/mastication: Slow prolonged chewing/mashing with complete recollection Bolus transport/lingual motion: Delayed initiation of tongue motion (oral holding) Oral residue: Complete oral clearance Location of oral residue : N/A Initiation of pharyngeal swallow : Pyriform sinuses  Pharyngeal Impairment Domain: Pharyngeal Impairment Domain Soft palate elevation: No bolus between soft palate (SP)/pharyngeal wall (PW) Laryngeal elevation: Complete superior movement of thyroid cartilage with complete approximation of arytenoids to epiglottic petiole Anterior hyoid excursion: Complete anterior movement Epiglottic movement: Complete inversion Laryngeal vestibule closure: Complete, no  air/contrast in laryngeal vestibule Pharyngeal stripping wave : Present - complete Pharyngeal contraction (A/P view only): N/A Pharyngoesophageal segment opening: Complete distension and complete duration, no obstruction of flow Tongue base retraction: No contrast between tongue base and posterior pharyngeal wall (PPW) Pharyngeal residue: Complete pharyngeal clearance Location of pharyngeal residue: N/A  Esophageal Impairment Domain: Esophageal Impairment Domain Esophageal clearance upright position: Complete clearance,  esophageal coating Pill: Pill Consistency administered: Thin liquids (Level 0) Thin liquids (Level 0): Impaired (see clinical impressions) Penetration/Aspiration Scale Score: Penetration/Aspiration Scale Score 1.  Material does not enter airway: Moderately thick liquids (Level 3, honey thick); Puree; Solid; Pill 2.  Material enters airway, remains ABOVE vocal cords then ejected out: Thin liquids (Level 0); Mildly thick liquids (Level 2, nectar thick) Compensatory Strategies: Compensatory Strategies Compensatory strategies: Yes Straw: Effective Effective Straw: Thin liquid (Level 0) Chin tuck: Effective Effective Chin Tuck: Thin liquid (Level 0)   General Information: Caregiver present: No  Diet Prior to this Study: Dysphagia 3 (mechanical soft); Thin liquids (Level 0)   Temperature : Normal   Respiratory Status: WFL   Supplemental O2: None (Room air)   History of Recent Intubation: No  Behavior/Cognition: Alert; Cooperative; Pleasant mood; Requires cueing Self-Feeding Abilities: Able to self-feed Baseline vocal quality/speech: Normal No data recorded Volitional Swallow: Able to elicit Exam Limitations: No limitations Goal Planning: Prognosis for improved oropharyngeal function: Good Barriers to Reach Goals: Cognitive deficits No data recorded Patient/Family Stated Goal: none stated Consulted and agree with results and recommendations: Patient Pain: Pain Assessment Pain Assessment: Faces Faces Pain Scale: 0 End of Session: Start Time:SLP Start Time (ACUTE ONLY): 1245 Stop Time: SLP Stop Time (ACUTE ONLY): 1301 Time Calculation:SLP Time Calculation (min) (ACUTE ONLY): 16 min Charges: SLP Evaluations $ SLP Speech Visit: 1 Visit SLP Evaluations $BSS Swallow: 1 Procedure $MBS Swallow: 1 Procedure $ SLP EVAL LANGUAGE/SOUND PRODUCTION: 1 Procedure SLP visit diagnosis: SLP Visit Diagnosis: Dysphagia, oral phase (R13.11) Past Medical History: Past Medical History: Diagnosis Date  Fatigue   Hyperlipidemia   Hypertension    NIDDM (non-insulin dependent diabetes mellitus)   Borderline Past Surgical History: Past Surgical History: Procedure Laterality Date  BREAST BIOPSY    BTL    COLONOSCOPY  02/17/2020  HERNIA REPAIR    OVARIAN CYST REMOVAL   Leita SAILOR., M.A. CCC-SLP Acute Rehabilitation Services Office: [phone] Secure chat preferred 06/24/2024, 1:43 PM       Scheduled Meds:  aspirin EC  81 mg Oral Daily   [START ON 06/26/2024] cephALEXin  500 mg Oral Q8H   clopidogrel  75 mg Oral Daily   enoxaparin (LOVENOX) injection  40 mg Subcutaneous Q24H   insulin aspart  0-15 Units Subcutaneous TID WC   insulin aspart  0-5 Units Subcutaneous QHS   insulin glargine-yfgn  12 Units Subcutaneous Daily   metFORMIN  850 mg Oral BID WC   metoprolol succinate  25 mg Oral Daily   montelukast  10 mg Oral QHS   rosuvastatin  20 mg Oral QPM   sodium chloride  flush  3 mL Intravenous Q12H   Continuous Infusions:     LOS: 1 day    Time spent: 40 minutes    Renato Applebaum, MD [facility]

## 2024-06-26 ENCOUNTER — Encounter (HOSPITAL_COMMUNITY): Payer: Self-pay | Admitting: Internal Medicine

## 2024-06-26 ENCOUNTER — Other Ambulatory Visit: Payer: Self-pay

## 2024-06-26 ENCOUNTER — Encounter (HOSPITAL_COMMUNITY): Payer: Self-pay | Admitting: Physical Medicine and Rehabilitation

## 2024-06-26 ENCOUNTER — Inpatient Hospital Stay (HOSPITAL_COMMUNITY)
Admission: AD | Admit: 2024-06-26 | Discharge: 2024-07-11 | DRG: 057 | Disposition: A | Discharge status: Home Health | Source: Intra-hospital | Attending: Physical Medicine and Rehabilitation | Admitting: Physical Medicine and Rehabilitation

## 2024-06-26 DIAGNOSIS — I639 Cerebral infarction, unspecified: Secondary | ICD-10-CM | POA: Diagnosis not present

## 2024-06-26 DIAGNOSIS — I69322 Dysarthria following cerebral infarction: Secondary | ICD-10-CM

## 2024-06-26 DIAGNOSIS — Z794 Long term (current) use of insulin: Secondary | ICD-10-CM

## 2024-06-26 DIAGNOSIS — Z79899 Other long term (current) drug therapy: Secondary | ICD-10-CM | POA: Diagnosis not present

## 2024-06-26 DIAGNOSIS — I10 Essential (primary) hypertension: Secondary | ICD-10-CM | POA: Diagnosis present

## 2024-06-26 DIAGNOSIS — I959 Hypotension, unspecified: Secondary | ICD-10-CM | POA: Diagnosis present

## 2024-06-26 DIAGNOSIS — I63511 Cerebral infarction due to unspecified occlusion or stenosis of right middle cerebral artery: Principal | ICD-10-CM | POA: Diagnosis present

## 2024-06-26 DIAGNOSIS — Z888 Allergy status to other drugs, medicaments and biological substances status: Secondary | ICD-10-CM

## 2024-06-26 DIAGNOSIS — J4520 Mild intermittent asthma, uncomplicated: Secondary | ICD-10-CM | POA: Diagnosis present

## 2024-06-26 DIAGNOSIS — Z7902 Long term (current) use of antithrombotics/antiplatelets: Secondary | ICD-10-CM | POA: Diagnosis not present

## 2024-06-26 DIAGNOSIS — E119 Type 2 diabetes mellitus without complications: Secondary | ICD-10-CM | POA: Diagnosis present

## 2024-06-26 DIAGNOSIS — Z7984 Long term (current) use of oral hypoglycemic drugs: Secondary | ICD-10-CM | POA: Diagnosis not present

## 2024-06-26 DIAGNOSIS — E1165 Type 2 diabetes mellitus with hyperglycemia: Secondary | ICD-10-CM | POA: Diagnosis not present

## 2024-06-26 DIAGNOSIS — D259 Leiomyoma of uterus, unspecified: Secondary | ICD-10-CM | POA: Diagnosis present

## 2024-06-26 DIAGNOSIS — Z91148 Patient's other noncompliance with medication regimen for other reason: Secondary | ICD-10-CM | POA: Diagnosis not present

## 2024-06-26 DIAGNOSIS — Z825 Family history of asthma and other chronic lower respiratory diseases: Secondary | ICD-10-CM

## 2024-06-26 DIAGNOSIS — I69354 Hemiplegia and hemiparesis following cerebral infarction affecting left non-dominant side: Principal | ICD-10-CM

## 2024-06-26 DIAGNOSIS — E042 Nontoxic multinodular goiter: Secondary | ICD-10-CM | POA: Diagnosis present

## 2024-06-26 DIAGNOSIS — R110 Nausea: Secondary | ICD-10-CM | POA: Diagnosis present

## 2024-06-26 DIAGNOSIS — E785 Hyperlipidemia, unspecified: Secondary | ICD-10-CM | POA: Diagnosis present

## 2024-06-26 DIAGNOSIS — I69392 Facial weakness following cerebral infarction: Secondary | ICD-10-CM | POA: Diagnosis not present

## 2024-06-26 DIAGNOSIS — I69919 Unspecified symptoms and signs involving cognitive functions following unspecified cerebrovascular disease: Secondary | ICD-10-CM | POA: Diagnosis not present

## 2024-06-26 DIAGNOSIS — B9620 Unspecified Escherichia coli [E. coli] as the cause of diseases classified elsewhere: Secondary | ICD-10-CM | POA: Diagnosis present

## 2024-06-26 DIAGNOSIS — Z7982 Long term (current) use of aspirin: Secondary | ICD-10-CM | POA: Diagnosis not present

## 2024-06-26 DIAGNOSIS — K5730 Diverticulosis of large intestine without perforation or abscess without bleeding: Secondary | ICD-10-CM | POA: Diagnosis present

## 2024-06-26 DIAGNOSIS — R1312 Dysphagia, oropharyngeal phase: Secondary | ICD-10-CM | POA: Diagnosis present

## 2024-06-26 DIAGNOSIS — N390 Urinary tract infection, site not specified: Secondary | ICD-10-CM | POA: Diagnosis present

## 2024-06-26 LAB — GLUCOSE, CAPILLARY
Glucose-Capillary: 151 mg/dL — ABNORMAL HIGH (ref 70–99)
Glucose-Capillary: 120 mg/dL — ABNORMAL HIGH (ref 70–99)
Glucose-Capillary: 145 mg/dL — ABNORMAL HIGH (ref 70–99)
Glucose-Capillary: 125 mg/dL — ABNORMAL HIGH (ref 70–99)

## 2024-06-26 MED ORDER — MAGNESIUM HYDROXIDE 400 MG/5ML PO SUSP: 45.0000 mL | Freq: Every day | ORAL | Status: DC | PRN

## 2024-06-26 MED ORDER — POLYETHYLENE GLYCOL 3350 17 G PO PACK: 17.0000 g | PACK | Freq: Every day | ORAL | Status: DC

## 2024-06-26 MED ORDER — DIPHENHYDRAMINE HCL 25 MG PO CAPS: 25.0000 mg | ORAL_CAPSULE | Freq: Four times a day (QID) | ORAL | Status: DC | PRN

## 2024-06-26 MED ORDER — ALUM & MAG HYDROXIDE-SIMETH 200-200-20 MG/5ML PO SUSP: 30.0000 mL | ORAL | Status: DC | PRN

## 2024-06-26 MED ORDER — GUAIFENESIN-DM 100-10 MG/5ML PO SYRP: 5.0000 mL | ORAL_SOLUTION | Freq: Four times a day (QID) | ORAL | Status: DC | PRN

## 2024-06-26 MED ORDER — ACETAMINOPHEN 325 MG PO TABS
325.0000 mg | ORAL_TABLET | ORAL | Status: DC | PRN
  Administered 2024-06-27 – 2024-06-29 (×3): 650 mg via ORAL
  Filled 2024-06-26 (×3): qty 2

## 2024-06-26 MED ORDER — METFORMIN HCL 500 MG PO TABS
1000.0000 mg | ORAL_TABLET | Freq: Two times a day (BID) | ORAL | Status: DC
  Administered 2024-06-26 – 2024-06-27 (×3): 1000 mg via ORAL
  Filled 2024-06-26 (×4): qty 2

## 2024-06-26 MED ORDER — CLOPIDOGREL BISULFATE 75 MG PO TABS
75.0000 mg | ORAL_TABLET | Freq: Every day | ORAL | Status: DC
  Administered 2024-06-27 – 2024-07-11 (×15): 75 mg via ORAL
  Filled 2024-06-26 (×16): qty 1

## 2024-06-26 MED ORDER — BISACODYL 10 MG RE SUPP
10.0000 mg | Freq: Every day | RECTAL | Status: DC | PRN
  Administered 2024-07-03: 10 mg via RECTAL
  Filled 2024-06-26: qty 1

## 2024-06-26 MED ORDER — ASPIRIN 81 MG PO TBEC
81.0000 mg | DELAYED_RELEASE_TABLET | Freq: Every day | ORAL | Status: DC
  Administered 2024-06-27 – 2024-07-11 (×15): 81 mg via ORAL
  Filled 2024-06-26 (×16): qty 1

## 2024-06-26 MED ORDER — INSULIN GLARGINE-YFGN 100 UNIT/ML ~~LOC~~ SOLN: 12.0000 [IU] | Freq: Every day | SUBCUTANEOUS | Status: DC

## 2024-06-26 MED ORDER — INSULIN ASPART 100 UNIT/ML IJ SOLN
0.0000 [IU] | Freq: Three times a day (TID) | INTRAMUSCULAR | Status: DC
  Administered 2024-06-27: 1 [IU] via SUBCUTANEOUS
  Administered 2024-06-27: 2 [IU] via SUBCUTANEOUS
  Administered 2024-06-28 – 2024-06-29 (×6): 1 [IU] via SUBCUTANEOUS
  Administered 2024-06-30 – 2024-07-01 (×3): 2 [IU] via SUBCUTANEOUS
  Administered 2024-07-01: 1 [IU] via SUBCUTANEOUS
  Administered 2024-07-02 (×3): 2 [IU] via SUBCUTANEOUS
  Administered 2024-07-03 (×2): 1 [IU] via SUBCUTANEOUS
  Administered 2024-07-03 – 2024-07-04 (×2): 2 [IU] via SUBCUTANEOUS
  Administered 2024-07-04 (×2): 5 [IU] via SUBCUTANEOUS
  Administered 2024-07-05: 1 [IU] via SUBCUTANEOUS
  Administered 2024-07-05: 2 [IU] via SUBCUTANEOUS
  Administered 2024-07-05: 3 [IU] via SUBCUTANEOUS
  Administered 2024-07-06: 1 [IU] via SUBCUTANEOUS
  Administered 2024-07-06 (×2): 2 [IU] via SUBCUTANEOUS
  Administered 2024-07-07 (×2): 3 [IU] via SUBCUTANEOUS
  Administered 2024-07-07: 1 [IU] via SUBCUTANEOUS
  Administered 2024-07-08: 5 [IU] via SUBCUTANEOUS
  Administered 2024-07-08 – 2024-07-09 (×4): 2 [IU] via SUBCUTANEOUS
  Administered 2024-07-09: 3 [IU] via SUBCUTANEOUS
  Administered 2024-07-10 – 2024-07-11 (×4): 2 [IU] via SUBCUTANEOUS

## 2024-06-26 MED ORDER — ROSUVASTATIN CALCIUM 20 MG PO TABS
20.0000 mg | ORAL_TABLET | Freq: Every evening | ORAL | Status: DC
  Administered 2024-06-26 – 2024-07-10 (×15): 20 mg via ORAL
  Filled 2024-06-26 (×15): qty 1

## 2024-06-26 MED ORDER — PROCHLORPERAZINE 25 MG RE SUPP: 12.5000 mg | Freq: Four times a day (QID) | RECTAL | Status: DC | PRN

## 2024-06-26 MED ORDER — MELATONIN 5 MG PO TABS: 5.0000 mg | ORAL_TABLET | Freq: Every evening | ORAL | Status: DC | PRN

## 2024-06-26 MED ORDER — INSULIN GLARGINE-YFGN 100 UNIT/ML ~~LOC~~ SOLN
12.0000 [IU] | Freq: Every day | SUBCUTANEOUS | Status: DC
  Administered 2024-06-27 – 2024-07-06 (×10): 12 [IU] via SUBCUTANEOUS
  Filled 2024-06-26 (×12): qty 0.12

## 2024-06-26 MED ORDER — CLOPIDOGREL BISULFATE 75 MG PO TABS: 75.0000 mg | ORAL_TABLET | Freq: Every day | ORAL | Status: DC

## 2024-06-26 MED ORDER — PROCHLORPERAZINE MALEATE 5 MG PO TABS
5.0000 mg | ORAL_TABLET | Freq: Four times a day (QID) | ORAL | Status: DC | PRN
  Administered 2024-06-26: 5 mg via ORAL
  Administered 2024-06-27: 10 mg via ORAL
  Filled 2024-06-26: qty 2
  Filled 2024-06-26: qty 1
  Filled 2024-06-26: qty 2

## 2024-06-26 MED ORDER — METFORMIN HCL 850 MG PO TABS: 850.0000 mg | ORAL_TABLET | Freq: Two times a day (BID) | ORAL | Status: DC

## 2024-06-26 MED ORDER — CEPHALEXIN 500 MG PO CAPS: 500.0000 mg | ORAL_CAPSULE | Freq: Three times a day (TID) | ORAL | Status: DC

## 2024-06-26 MED ORDER — CEPHALEXIN 250 MG PO CAPS
500.0000 mg | ORAL_CAPSULE | Freq: Three times a day (TID) | ORAL | Status: AC
  Administered 2024-06-26 – 2024-06-29 (×11): 500 mg via ORAL
  Filled 2024-06-26 (×11): qty 2

## 2024-06-26 MED ORDER — ENOXAPARIN SODIUM 40 MG/0.4ML IJ SOSY
40.0000 mg | PREFILLED_SYRINGE | INTRAMUSCULAR | Status: DC
  Administered 2024-06-27 – 2024-07-10 (×14): 40 mg via SUBCUTANEOUS
  Filled 2024-06-26 (×14): qty 0.4

## 2024-06-26 MED ORDER — POLYETHYLENE GLYCOL 3350 17 G PO PACK
17.0000 g | PACK | Freq: Every day | ORAL | Status: DC
  Administered 2024-06-27 – 2024-07-09 (×13): 17 g via ORAL
  Filled 2024-06-26 (×15): qty 1

## 2024-06-26 MED ORDER — METOPROLOL SUCCINATE ER 25 MG PO TB24
25.0000 mg | ORAL_TABLET | Freq: Every day | ORAL | Status: DC
  Filled 2024-06-26: qty 1

## 2024-06-26 MED ORDER — PROCHLORPERAZINE EDISYLATE 10 MG/2ML IJ SOLN: 5.0000 mg | Freq: Four times a day (QID) | INTRAMUSCULAR | Status: DC | PRN

## 2024-06-26 MED ORDER — FLEET ENEMA RE ENEM: 1.0000 | ENEMA | Freq: Once | RECTAL | Status: DC | PRN

## 2024-06-26 MED ORDER — ASPIRIN 81 MG PO TBEC: 81.0000 mg | DELAYED_RELEASE_TABLET | Freq: Every day | ORAL | Status: DC

## 2024-06-26 MED ORDER — MONTELUKAST SODIUM 10 MG PO TABS
10.0000 mg | ORAL_TABLET | Freq: Every day | ORAL | Status: DC
  Administered 2024-06-26 – 2024-07-10 (×15): 10 mg via ORAL
  Filled 2024-06-26 (×15): qty 1

## 2024-06-26 MED ORDER — INSULIN ASPART 100 UNIT/ML IJ SOLN
0.0000 [IU] | Freq: Every day | INTRAMUSCULAR | Status: DC
  Administered 2024-07-02 – 2024-07-04 (×3): 2 [IU] via SUBCUTANEOUS
  Administered 2024-07-05: 3 [IU] via SUBCUTANEOUS
  Administered 2024-07-06: 2 [IU] via SUBCUTANEOUS
  Administered 2024-07-08 – 2024-07-09 (×2): 3 [IU] via SUBCUTANEOUS

## 2024-06-26 MED ORDER — ROSUVASTATIN CALCIUM 20 MG PO TABS: 20.0000 mg | ORAL_TABLET | Freq: Every day | ORAL | Status: DC

## 2024-06-26 MED ORDER — MAGNESIUM HYDROXIDE 400 MG/5ML PO SUSP
45.0000 mL | Freq: Once | ORAL | Status: AC
  Administered 2024-06-26: 45 mL via ORAL
  Filled 2024-06-26: qty 60

## 2024-06-26 NOTE — Progress Notes (Signed)
 Patient ID: Monica Austin, female   DOB: [birth year], 68 y.o.   MRN: [id number] Met with the patient to review current medical situation, rehab process, team conference and plan of care. Discussed secondary risk including DM (A1C 9.7) on Metformin; insulin added, HTN, HLD.  Reviewed dysphagia diet with HH/CMM limitation and dietary modification recommendations. Given information on insulin administration, CBG monitoring and self care with diabetes.  Reviewed PCP in [place]. Discussed DAPT x 3 months then ASA solo. Continue to follow along to address educational needs to facilitate preparation for discharge. Fredericka Barnie NOVAK

## 2024-06-26 NOTE — H&P (Signed)
 Physical Medicine and Rehabilitation Admission H&P        Chief Complaint  Patient presents with   Functional deficits due to stroke       HPI:  Monica Austin is a 68 year old R handed female with history of HTN, T2DM- A1c 9.7 (per pt usually ~6.5-7.0), hyperlipidemia, medication non-compliance; who was admitted to hospital while vacationing in [place]  on 06/21/24 with elevated BP- 216/66, left sided weakness, slurred speech and dysphagia. LSN the night before she went to bed and did not receive thrombolysis. She was found to have right basal ganglia infarct with moderate to severe narrowing B-PCA, moderate to severe narrowing R-VA, severe focal narrowing mid portion of L-VA, severe focal narrowing M2 L-MCA,  distal M1 R-MCA as well as incidental findings of diffuse enlargement of thyroid gland with multiple small hypodense nodules and multilevel cervical spondylosis. DAPT recommended. She left AMA, was noted to have issues with aspiration enroute to [facility] and presented to [facility] ED for evaluation and transferred to [facility] for management. Dr. Jerrie recommended 90 day duration of DAPT followed by ASA alone. She was started on rocephin as found to have E coli UTI and transitioned to Keflex to complete 7 day course of treatment. T2DM poorly controlled with A1C-9.7 and insulin recommended by diabetes coordinator.    Speech therapy evaluation showed oropharyngeal dysphagia with impaired mastication and penetration per MBS. Current recommendations are -D3, thin liquids recommended and if coughing persists try chin tuck (reduce amount of penetration) and remove straws. 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 She spends times between [facility] and [place]-->Lives with grandson and works for [employer] in [place]. She does  not have local [place]. She was independent PTA and CIR recommended due to functional decline.    Pt reports no dysuria or urinary issues, but has urinary incontinence garments in room.  Voiding well.    Said she feels really sleepy today.  Also said had N/V last night and this AM- due to dinner. Also meds got stuck and lovenox made her vomit.    LBM today- denies constipation.   Review of Systems  Constitutional:  Negative for fever.  Eyes:  Negative for blurred vision and double vision.  Respiratory:  Negative for shortness of breath.   Cardiovascular:  Negative for chest pain and palpitations.  Gastrointestinal:  Negative for abdominal pain, constipation and heartburn.  Genitourinary:  Negative for dysuria and frequency.       Incontinence garments in room  Musculoskeletal:  Negative for back pain and myalgias.  Neurological:  Positive for speech change and weakness. Negative for headaches.  Psychiatric/Behavioral:  The patient is not nervous/anxious and does not have insomnia.   All other systems reviewed and are negative.          Past Medical History:  Diagnosis Date   Fatigue     Hyperlipidemia     Hypertension     NIDDM (non-insulin dependent diabetes mellitus)      Borderline               Past Surgical History:  Procedure Laterality Date   BREAST BIOPSY       BTL       COLONOSCOPY   02/17/2020   HERNIA REPAIR       OVARIAN CYST REMOVAL  Family History  Problem Relation Age of Onset   COPD Mother     Cancer Father     Colon polyps Sister     Colon cancer Neg Hx     Esophageal cancer Neg Hx     Pancreatic cancer Neg Hx     Stomach cancer Neg Hx            Social History:  Lives with grandson (31 years old) daughter to from [place] to assist after d/c. She  reports that she has never smoked. She has never used smokeless tobacco. She reports that she does not currently use alcohol. She reports that she does not use drugs.   :   Allergies       Allergies  Allergen Reactions   Zestril [Lisinopril] Swelling      Tongue swelling   Zocor [Simvastatin] Swelling              Medications Prior to Admission  Medication Sig Dispense Refill   calcium carbonate (OS-CAL - DOSED IN MG OF ELEMENTAL CALCIUM) 1250 (500 Ca) MG tablet Take 1 tablet by mouth daily.       metFORMIN (GLUCOPHAGE) 850 MG tablet Take 850 mg by mouth daily.       montelukast (SINGULAIR) 10 MG tablet Take 10 mg by mouth at bedtime.       rosuvastatin (CRESTOR) 10 MG tablet Take 10 mg by mouth daily.       ezetimibe (ZETIA) 10 MG tablet Take 10 mg by mouth daily. (Patient not taking: Reported on 06/23/2024)       metoprolol succinate (TOPROL-XL) 25 MG 24 hr tablet Take 25 mg by mouth daily. (Patient not taking: Reported on 06/23/2024)                  Home: Home Living Family/patient expects to be discharged to:: Private residence Living Arrangements: Other relatives Available Help at Discharge: Family, Available 24 hours/day Type of Home: House (has a house in [place], which her grandson also stays at, but she also goes to [place] for periods of time to work and she stays with her daughter while up there) Home Access: Stairs to enter Secretary/administrator of Steps: 6-7 Entrance Stairs-Rails: Right, Left Home Layout: One level, Laundry or work area in basement [employer] Shower/Tub: Engineer, manufacturing systems: Standard (comfort) Home Equipment: Grab bars - tub/shower, Hand held shower head  Lives With: Family   Functional History: Prior Function Prior Level of Function : Independent/Modified Independent, Driving, Working/employed ADLs Comments: works in Engineer, mining; predominantly desk/phone calls   Functional Status:  Mobility: Bed Mobility Overal bed mobility: Needs Assistance Bed Mobility: Supine to Sit, Sit to Supine Supine to sit: Contact guard Sit to supine: Contact guard assist General bed mobility  comments: CGA with trunk to rise into sitting.  Pt required increased time and effort to scoot to edge of bed to prepare for t/f training. Transfers Overall transfer level: Needs assistance Equipment used: Rollator (4 wheels) Transfers: Sit to/from Stand Sit to Stand: Min assist General transfer comment: min assist to power up and max VCs for locking rollator breaks buring sit<>stand transfers,  Presents with poor eccentric load and not backing fully to seated surface before sitting. Ambulation/Gait Ambulation/Gait assistance: Min assist, +2 safety/equipment Gait Distance (Feet): 60 Feet (x3 trials with standing rest breaks to readjust in rollator and avoid pushing device too far forward.) Assistive device: Rollator (4 wheels) Gait Pattern/deviations: Step-through pattern, Decreased step length - left,  Decreased stride length, Decreased stance time - left, Decreased dorsiflexion - left, Drifts right/left General Gait Details: Pt was able to ambulate with rollator needing cues to increased BOS and stay close to rollator. Pt at times needed cues to step left LE as she shortens step length.  Appears to have decr attention to left side with poor awareness of where left UE was as well as not tracking fully to left without cuing. Pt also impulsive and walking too quickly intiialy.  Cues to slow down gt speed and promote safety.  Poor balance noted with heavy min assistance for righting. Gait velocity interpretation: <1.8 ft/sec, indicate of risk for recurrent falls   ADL: ADL Overall ADL's : Needs assistance/impaired Eating/Feeding: Set up, Sitting Eating/Feeding Details (indicate cue type and reason): uses RUE to feed self, requires setup Grooming: Wash/dry hands, Wash/dry face, Contact guard assist, Standing Grooming Details (indicate cue type and reason): at sink Upper Body Bathing: Supervision/ safety, Sitting, Cueing for sequencing Upper Body Bathing Details (indicate cue type and reason): in  shower Lower Body Bathing: Minimal assistance, Sit to/from stand, Cueing for sequencing Upper Body Dressing : Minimal assistance, Sitting Upper Body Dressing Details (indicate cue type and reason): to donn pullover top Lower Body Dressing: Minimal assistance, Sit to/from stand Lower Body Dressing Details (indicate cue type and reason): patient donned pullup briefs and socks with min assist Toilet Transfer: Minimal assistance, Ambulation, Rolling walker (2 wheels) Toilet Transfer Details (indicate cue type and reason): CGA progressing to min A Tub/ Shower Transfer: Walk-in shower, Minimal assistance, Shower seat, Engineer, technical sales Details (indicate cue type and reason): cues for safety and grab bars use Functional mobility during ADLs: Minimal assistance, Contact guard assist, Rollator (4 wheels) General ADL Comments: required cues for safety and sequencing. Son present and encouraged to allow patient to perform tasks   Cognition: Cognition Overall Cognitive Status: Impaired/Different from baseline Arousal/Alertness: Awake/alert Orientation Level: Oriented X4 Attention: Sustained Sustained Attention: Appears intact Memory: Impaired Memory Impairment: Decreased recall of new information Awareness: Impaired Awareness Impairment: Intellectual impairment, Other (comment) (online awareness) Problem Solving: Impaired Problem Solving Impairment: Verbal basic Safety/Judgment: Impaired Cognition Arousal: Alert Behavior During Therapy: WFL for tasks assessed/performed, Flat affect Overall Cognitive Status: Impaired/Different from baseline   Physical Exam: Blood pressure (!) 144/69, pulse 62, temperature 98 F (36.7 C), resp. rate 17, height 5' 7 (1.702 m), weight 84 kg, SpO2 95%. Physical Exam Vitals and nursing note reviewed.  Constitutional:      Appearance: Normal appearance. She is obese.     Comments: Pt sitting up in bed; awake, alert, NAD- on phone shopping  HENT:      Head: Normocephalic and atraumatic.     Comments: L facial droop L tongue deviation Facial sensation intact    Right Ear: External ear normal.     Left Ear: External ear normal.     Nose: Nose normal. No congestion.     Mouth/Throat:     Mouth: Mucous membranes are dry.     Pharynx: Oropharynx is clear. No oropharyngeal exudate.  Eyes:     General:        Right eye: No discharge.        Left eye: No discharge.     Extraocular Movements: Extraocular movements intact.  Cardiovascular:     Rate and Rhythm: Normal rate and regular rhythm.     Heart sounds: Normal heart sounds. No murmur heard.    No gallop.  Pulmonary:  Effort: Pulmonary effort is normal. No respiratory distress.     Breath sounds: Normal breath sounds. No wheezing, rhonchi or rales.  Abdominal:     General: Bowel sounds are normal. There is no distension.     Palpations: Abdomen is soft.     Tenderness: There is no abdominal tenderness.  Musculoskeletal:     Cervical back: Neck supple. No tenderness.     Comments: RUE 5/5 thorughout LUE- ~4/5 ina ll muscles except FA which is 2+/5 RLE- 5/5 throughout LLE- 4+ throughout   Skin:    General: Skin is warm and dry.     Comments: No wounds seen  Neurological:     Mental Status: She is alert.     Comments: Left facial weakness with mild dysarthria. Able to answer orientation questions without difficulty and follow simple one and two step commands. Left sided weakness.  Knows in [facility] on July 3rd 2025 and why here, however slowed /delayed responses- also knows next holiday is 4th of July- tomorrow.  Intact to light touch in all 4 extremities   Psychiatric:     Comments: Extremely flat       Lab Results Last 48 Hours        Results for orders placed or performed during the hospital encounter of 06/23/24 (from the past 48 hours)  Glucose, capillary     Status: Abnormal    Collection Time: 06/24/24 11:35 AM  Result Value Ref Range     Glucose-Capillary 163 (H) 70 - 99 mg/dL      Comment: Glucose reference range applies only to samples taken after fasting for at least 8 hours.  Glucose, capillary     Status: Abnormal    Collection Time: 06/24/24  4:34 PM  Result Value Ref Range    Glucose-Capillary 174 (H) 70 - 99 mg/dL      Comment: Glucose reference range applies only to samples taken after fasting for at least 8 hours.  Glucose, capillary     Status: Abnormal    Collection Time: 06/24/24  7:47 PM  Result Value Ref Range    Glucose-Capillary 230 (H) 70 - 99 mg/dL      Comment: Glucose reference range applies only to samples taken after fasting for at least 8 hours.  Glucose, capillary     Status: Abnormal    Collection Time: 06/25/24  9:14 AM  Result Value Ref Range    Glucose-Capillary 245 (H) 70 - 99 mg/dL      Comment: Glucose reference range applies only to samples taken after fasting for at least 8 hours.  Glucose, capillary     Status: Abnormal    Collection Time: 06/25/24 12:04 PM  Result Value Ref Range    Glucose-Capillary 155 (H) 70 - 99 mg/dL      Comment: Glucose reference range applies only to samples taken after fasting for at least 8 hours.  Glucose, capillary     Status: None    Collection Time: 06/25/24  5:15 PM  Result Value Ref Range    Glucose-Capillary 90 70 - 99 mg/dL      Comment: Glucose reference range applies only to samples taken after fasting for at least 8 hours.  Glucose, capillary     Status: Abnormal    Collection Time: 06/25/24  9:26 PM  Result Value Ref Range    Glucose-Capillary 145 (H) 70 - 99 mg/dL      Comment: Glucose reference range applies only to samples taken after  fasting for at least 8 hours.  Glucose, capillary     Status: Abnormal    Collection Time: 06/26/24  8:10 AM  Result Value Ref Range    Glucose-Capillary 151 (H) 70 - 99 mg/dL      Comment: Glucose reference range applies only to samples taken after fasting for at least 8 hours.       Imaging Results  (Last 48 hours)  DG Swallowing Func-Speech Pathology Result Date: 06/24/2024 Table formatting from the original result was not included. Modified Barium Swallow Study Patient Details Name: Monica Austin MRN: [id number] Date of Birth: 1956/01/24 Today's Date: 06/24/2024 HPI/PMH: HPI: 68 yo recently admitted to hospital in [facility] while on vacation and dx with R basal ganglia stroke. SLP clinical swallow eval completed there on 06/22/24 reported impaired mastication, and IDDSI diet level 7 (easy to chew) was recommended. No overt pharyngeal symptoms observed but subjectively pt reported that her saliva felt like it went down the wrong way intermittently so MBS was recommended. Report not available so not sure if this was completed prior to pt leaving [place] 6/29, driving to [place] with family and reporting difficulty swallowing along the way. She presented directly to [facility] at the recommendation of MD in [facility]. PMH significant for HTN, HLD, DMII. Clinical Impression: Clinical Impression: Pt presents with an oral more than pharyngeal dysphagia. She has reduced labial seal, slow mastication, and delayed lingual transport. Oral deficits lead to premature loss of the bolus into the pharynx, with thin and nectar thick liquids also entering the laryngeal vestibule before the swallow. She has pretty good protective mechanisms of her airway, so that even when larger amounts of penetration occur (like with thin liquids via straw), she can almost always clear her laryngeal vestibule. If it does not occur immediately, it does on subsequent swallow. A chin tuck helped to reduce the volume of penetration compared to straw sips in a neutral position, but did not prevent penetration. Recommend that she continue with Dys 3 (mechanical soft) solids and thin liquids. If coughing persists at meal times, could consider further modifications (remove straws, try chin tuck, or thicken to nectar). Would still offer pills with puree as pt had  difficulty clearing the barium tablet from her oral cavity. When she ultimately did, it was transiently retained in the esophagus before clearing with a liquid wash. Factors that may increase risk of adverse event in presence of aspiration Noe & Lianne 2021): Factors that may increase risk of adverse event in presence of aspiration Noe & Lianne 2021): Reduced cognitive function; Limited mobility Recommendations/Plan: Swallowing Evaluation Recommendations Swallowing Evaluation Recommendations Recommendations: PO diet PO Diet Recommendation: Dysphagia 3 (Mechanical soft); Thin liquids (Level 0) Liquid Administration via: Cup; Straw Medication Administration: Whole meds with puree Supervision: Patient able to self-feed; Intermittent supervision/cueing for swallowing strategies; Set-up assistance for safety Swallowing strategies  : Slow rate; Small bites/sips; Check for anterior loss Postural changes: Position pt fully upright for meals Oral care recommendations: Oral care BID (2x/day) Treatment Plan Treatment Plan Treatment recommendations: Therapy as outlined in treatment plan below Follow-up recommendations: Acute inpatient rehab (3 hours/day) Functional status assessment: Patient has had a recent decline in their functional status and demonstrates the ability to make significant improvements in function in a reasonable and predictable amount of time. Treatment frequency: Min 2x/week Treatment duration: 2 weeks Interventions: Aspiration precaution training; Compensatory techniques; Patient/family education; Trials of upgraded texture/liquids; Diet toleration management by SLP Recommendations Recommendations for follow up therapy are one component of  a multi-disciplinary discharge planning process, led by the attending physician.  Recommendations may be updated based on patient status, additional functional criteria and insurance authorization. Assessment: Orofacial Exam: Orofacial Exam Oral Cavity -  Dentition: Adequate natural dentition Anatomy: Anatomy: WFL Boluses Administered: Boluses Administered Boluses Administered: Thin liquids (Level 0); Mildly thick liquids (Level 2, nectar thick); Moderately thick liquids (Level 3, honey thick); Puree; Solid  Oral Impairment Domain: Oral Impairment Domain Lip Closure: Interlabial escape, no progression to anterior lip Tongue control during bolus hold: Cohesive bolus between tongue to palatal seal Bolus preparation/mastication: Slow prolonged chewing/mashing with complete recollection Bolus transport/lingual motion: Delayed initiation of tongue motion (oral holding) Oral residue: Complete oral clearance Location of oral residue : N/A Initiation of pharyngeal swallow : Pyriform sinuses  Pharyngeal Impairment Domain: Pharyngeal Impairment Domain Soft palate elevation: No bolus between soft palate (SP)/pharyngeal wall (PW) Laryngeal elevation: Complete superior movement of thyroid cartilage with complete approximation of arytenoids to epiglottic petiole Anterior hyoid excursion: Complete anterior movement Epiglottic movement: Complete inversion Laryngeal vestibule closure: Complete, no air/contrast in laryngeal vestibule Pharyngeal stripping wave : Present - complete Pharyngeal contraction (A/P view only): N/A Pharyngoesophageal segment opening: Complete distension and complete duration, no obstruction of flow Tongue base retraction: No contrast between tongue base and posterior pharyngeal wall (PPW) Pharyngeal residue: Complete pharyngeal clearance Location of pharyngeal residue: N/A  Esophageal Impairment Domain: Esophageal Impairment Domain Esophageal clearance upright position: Complete clearance, esophageal coating Pill: Pill Consistency administered: Thin liquids (Level 0) Thin liquids (Level 0): Impaired (see clinical impressions) Penetration/Aspiration Scale Score: Penetration/Aspiration Scale Score 1.  Material does not enter airway: Moderately thick liquids  (Level 3, honey thick); Puree; Solid; Pill 2.  Material enters airway, remains ABOVE vocal cords then ejected out: Thin liquids (Level 0); Mildly thick liquids (Level 2, nectar thick) Compensatory Strategies: Compensatory Strategies Compensatory strategies: Yes Straw: Effective Effective Straw: Thin liquid (Level 0) Chin tuck: Effective Effective Chin Tuck: Thin liquid (Level 0)   General Information: Caregiver present: No  Diet Prior to this Study: Dysphagia 3 (mechanical soft); Thin liquids (Level 0)   Temperature : Normal   Respiratory Status: WFL   Supplemental O2: None (Room air)   History of Recent Intubation: No  Behavior/Cognition: Alert; Cooperative; Pleasant mood; Requires cueing Self-Feeding Abilities: Able to self-feed Baseline vocal quality/speech: Normal No data recorded Volitional Swallow: Able to elicit Exam Limitations: No limitations Goal Planning: Prognosis for improved oropharyngeal function: Good Barriers to Reach Goals: Cognitive deficits No data recorded Patient/Family Stated Goal: none stated Consulted and agree with results and recommendations: Patient Pain: Pain Assessment Pain Assessment: Faces Faces Pain Scale: 0 End of Session: Start Time:SLP Start Time (ACUTE ONLY): 1245 Stop Time: SLP Stop Time (ACUTE ONLY): 1301 Time Calculation:SLP Time Calculation (min) (ACUTE ONLY): 16 min Charges: SLP Evaluations $ SLP Speech Visit: 1 Visit SLP Evaluations $BSS Swallow: 1 Procedure $MBS Swallow: 1 Procedure $ SLP EVAL LANGUAGE/SOUND PRODUCTION: 1 Procedure SLP visit diagnosis: SLP Visit Diagnosis: Dysphagia, oral phase (R13.11) Past Medical History: Past Medical History: Diagnosis Date  Fatigue   Hyperlipidemia   Hypertension   NIDDM (non-insulin dependent diabetes mellitus)   Borderline Past Surgical History: Past Surgical History: Procedure Laterality Date  BREAST BIOPSY    BTL    COLONOSCOPY  02/17/2020  HERNIA REPAIR    OVARIAN CYST REMOVAL   Leita SAILOR., M.A. CCC-SLP Acute Rehabilitation  Services Office: [phone] Secure chat preferred 06/24/2024, 1:43 PM          Blood pressure (!) 144/69,  pulse 62, temperature 98 F (36.7 C), resp. rate 17, height 5' 7 (1.702 m), weight 84 kg, SpO2 95%.   Medical Problem List and Plan: 1. Functional deficits secondary to R BG stroke/R MCA stroke             -patient may  shower             -ELOS/Goals: 10-14 days- supervision to mod I- needs PT, OT and SLP             Admit to CIR 2.  Antithrombotics: -DVT/anticoagulation:  Pharmaceutical: Lovenox             -antiplatelet therapy: DAPT X 3 months followed by ASA 3. Pain Management: tylenol prn.  4. Mood/Behavior/Sleep: LCSW to follow for evaluation and support.              -antipsychotic agents: N/A 5. Neuropsych/cognition: This patient is capable of making decisions on her own behalf. 6. Skin/Wound Care: Routine pressure relief measures.  7. Fluids/Electrolytes/Nutrition: Monitor I/O. Check CMET in am.  8. E coli UTI: Keflex antibiotic D#3/7 9. HTN: Monitor BP TID--continue. Avoid hypotension with evidence of intracranial stenosis.             --metoprolol daily 10.  T2DM: Hgb A1c- 9.7. Monitor BS ac/hs and use SSI for elevated BS. Add CM restrictions to diet.             --Was on metformin PTA with poor control.  --now on Insulin glargline. Increase metformin to 1000 mg bid/decrease SSI to sensitive.               --RD consulted for education on carb modified diet and Rehab RN for insulin education.  11. Dyslipidemia: On Crestor.  12.  Mild intermittent asthma: On Singulair 13. Thyroid nodules/enlargement: Ultrasound for work up after discharge.        Sharlet GORMAN Schmitz, PA-C 06/26/2024   I have personally performed a face to face diagnostic evaluation of this patient and formulated the key components of the plan.  Additionally, I have personally reviewed laboratory data, imaging studies, as well as relevant notes and concur with the physician assistant's documentation  above.   The patient's status has not changed from the original H&P.  Any changes in documentation from the acute care chart have been noted above.

## 2024-06-26 NOTE — Progress Notes (Signed)
 Dinner tray arrived. Reported sensation of being nauseous. Did report BM this morning, 7/3. Open to taking medication for constipation. Dinner tray just arriving to the unit. Blood sugar rechecked due to patient expressing sensation of being nauseous. Neuro check completed.

## 2024-06-26 NOTE — Discharge Instructions (Addendum)
 Heart Healthy, Consistent Carbohydrate Nutrition Therapy   A heart-healthy and consistent carbohydrate diet is recommended to manage heart disease and diabetes. To follow a heart-healthy and consistent carbohydrate diet, Eat a balanced diet with whole grains, fruits and vegetables, and lean protein sources.  Choose heart-healthy unsaturated fats. Limit saturated fats, trans fats, and cholesterol intake. Eat more plant-based or vegetarian meals using beans and soy foods for protein.  Eat whole, unprocessed foods to limit the amount of sodium (salt) you eat.  Choose a consistent amount of carbohydrate at each meal and snack. Limit refined carbohydrates especially sugar, sweets and sugar-sweetened beverages.  If you drink alcohol, do so in moderation: one serving per day (women) and two servings per day (men). o One serving is equivalent to 12 ounces beer, 5 ounces wine, or 1.5 ounces distilled spirits  Tips Tips for Choosing Heart-Healthy Fats Choose lean protein and low-fat dairy foods to reduce saturated fat intake. Saturated fat is usually found in animal-based protein and is associated with certain health risks. Saturated fat is the biggest contributor to raise low-density lipoprotein (LDL) cholesterol levels. Research shows that limiting saturated fat lowers unhealthy cholesterol levels. Eat no more than 7% of your total calories each day from saturated fat. Ask your RDN to help you determine how much saturated fat is right for you. There are many foods that do not contain large amounts of saturated fats. Swapping these foods to replace foods high in saturated fats will help you limit the saturated fat you eat and improve your cholesterol levels. You can also try eating more plant-based or vegetarian meals. Instead of. Try:  Whole milk, cheese, yogurt, and ice cream 1% or skim milk, low-fat cheese, non-fat yogurt, and low-fat ice cream  Fatty, marbled beef and pork Lean beef, pork, or venison   Poultry with skin Poultry without skin  Butter, stick margarine Reduced-fat, whipped, or liquid spreads  Coconut oil, palm oil Liquid vegetable oils: corn, canola, olive, soybean and safflower oils   Avoid foods that contain trans fats. Trans fats increase levels of LDL-cholesterol. Hydrogenated fat in processed foods is the main source of trans fats in foods.  Trans fats can be found in stick margarine, shortening, processed sweets, baked goods, some fried foods, and packaged foods made with hydrogenated oils. Avoid foods with "partially hydrogenated oil" on the ingredient list such as: cookies, pastries, baked goods, biscuits, crackers, microwave popcorn, and frozen dinners. Choose foods with heart healthy fats. Polyunsaturated and monounsaturated fat are unsaturated fats that may help lower your blood cholesterol level when used in place of saturated fat in your diet. Ask your RDN about taking a dietary supplement with plant sterols and stanols to help lower your cholesterol level. Research shows that substituting saturated fats with unsaturated fats is beneficial to cholesterol levels. Try these easy swaps: Instead of. Try:  Butter, stick margarine, or solid shortening Reduced-fat, whipped, or liquid spreads  Beef, pork, or poultry with skin Fish and seafood  Chips, crackers, snack foods Raw or unsalted nuts and seeds or nut butters Hummus with vegetables Avocado on toast  Coconut oil, palm oil Liquid vegetable oils: corn, canola, olive, soybean and safflower oils  Limit the amount of cholesterol you eat to less than 200 milligrams per day. Cholesterol is a substance carried through the bloodstream via lipoproteins, which are known as "transporters" of fat. Some body functions need cholesterol to work properly, but too much cholesterol in the bloodstream can damage arteries and build up blood vessel linings (  which can lead to heart attack and stroke). You should eat less than 200 milligrams  cholesterol per day. People respond differently to eating cholesterol. There is no test available right now that can figure out which people will respond more to dietary cholesterol and which will respond less. For individuals with high intake of dietary cholesterol, different types of increase (none, small, moderate, large) in LDL-cholesterol levels are all possible.  Food sources of cholesterol include egg yolks and organ meats such as liver, gizzards. Limit egg yolks to two to four per week and avoid organ meats like liver and gizzards to control cholesterol intake. Tips for Choosing Heart-Healthy Carbohydrates Consume a consistent amount of carbohydrate It is important to eat foods with carbohydrates in moderation because they impact your blood glucose level. Carbohydrates can be found in many foods such as: Grains (breads, crackers, rice, pasta, and cereals)  Starchy Vegetables (potatoes, corn, and peas)  Beans and legumes  Milk, soy milk, and yogurt  Fruit and fruit juice  Sweets (cakes, cookies, ice cream, jam and jelly) Your RDN will help you set a goal for how many carbohydrate servings to eat at your meals and snacks. For many adults, eating 3 to 5 servings of carbohydrate foods at each meal and 1 or 2 carbohydrate servings for each snack works well.  Check your blood glucose level regularly. It can tell you if you need to adjust when you eat carbohydrates. Choose foods rich in viscous (soluble) fiber Viscous, or soluble, is found in the walls of plant cells. Viscous fiber is found only in plant-based foods. Eating foods with fiber helps to lower your unhealthy cholesterol and keep your blood glucose in range  Rich sources of viscous fiber include vegetables (asparagus, Brussels sprouts, sweet potatoes, turnips) fruit (apricots, mangoes, oranges), legumes, and whole grains (barley, oats, and oat bran).  As you increase your fiber intake gradually, also increase the amount of water you  drink. This will help prevent constipation.  If you have difficulty achieving this goal, ask your RDN about fiber laxatives. Choose fiber supplements made with viscous fibers such as psyllium seed husks or methylcellulose to help lower unhealthy cholesterol.  Limit refined carbohydrates  There are three types of carbohydrates: starches, sugar, and fiber. Some carbohydrates occur naturally in food, like the starches in rice or corn or the sugars in fruits and milk. Refined carbohydrates--foods with high amounts of simple sugars--can raise triglyceride levels. High triglyceride levels are associated with coronary heart disease. Some examples of refined carbohydrate foods are table sugar, sweets, and beverages sweetened with added sugar. Tips for Reducing Sodium (Salt) Although sodium is important for your body to function, too much sodium can be harmful for people with high blood pressure. As sodium and fluid buildup in your tissues and bloodstream, your blood pressure increases. High blood pressure may cause damage to other organs and increase your risk for a stroke. Even if you take a pill for blood pressure or a water pill (diuretic) to remove fluid, it is still important to have less salt in your diet. Ask your doctor and RDN what amount of sodium is right for you. Avoid processed foods. Eat more fresh foods.  Fresh fruits and vegetables are naturally low in sodium, as well as frozen vegetables and fruits that have no added juices or sauces.  Fresh meats are lower in sodium than processed meats, such as bacon, sausage, and hotdogs. Read the nutrition label or ask your butcher to help you find a  fresh meat that is low in sodium. Eat less salt--at the table and when cooking.  A single teaspoon of table salt has 2,300 mg of sodium.  Leave the salt out of recipes for pasta, casseroles, and soups.  Ask your RDN how to cook your favorite recipes without sodium Be a smart shopper.  Look for food packages  that say "salt-free" or "sodium-free." These items contain less than 5 milligrams of sodium per serving.  "Very low-sodium" products contain less than 35 milligrams of sodium per serving.  "Low-sodium" products contain less than 140 milligrams of sodium per serving.  Beware for "Unsalted" or "No Added Salt" products. These items may still be high in sodium. Check the nutrition label. Add flavors to your food without adding sodium.  Try lemon juice, lime juice, fruit juice or vinegar.  Dry or fresh herbs add flavor. Try basil, bay leaf, dill, rosemary, parsley, sage, dry mustard, nutmeg, thyme, and paprika.  Pepper, red pepper flakes, and cayenne pepper can add spice t your meals without adding sodium. Hot sauce contains sodium, but if you use just a drop or two, it will not add up to much.  Buy a sodium-free seasoning blend or make your own at home. Additional Lifestyle Tips Achieve and maintain a healthy weight. Talk with your RDN or your doctor about what is a healthy weight for you. Set goals to reach and maintain that weight.  To lose weight, reduce your calorie intake along with increasing your physical activity. A weight loss of 10 to 15 pounds could reduce LDL-cholesterol by 5 milligrams per deciliter. Participate in physical activity. Talk with your health care team to find out what types of physical activity are best for you. Set a plan to get about 30 minutes of exercise on most days.  Foods Recommended Food Group Foods Recommended  Grains Whole grain breads and cereals, including whole wheat, barley, rye, buckwheat, corn, teff, quinoa, millet, amaranth, brown or wild rice, sorghum, and oats Pasta, especially whole wheat or other whole grain types  [employer] [employer], quinoa or wild rice Whole grain crackers, bread, rolls, pitas Home-made bread with reduced-sodium baking soda  Protein Foods Lean cuts of beef and pork (loin, leg, round, extra lean hamburger)  Skinless Press photographer  and other wild game Dried beans and peas Nuts and nut butters Meat alternatives made with soy or textured vegetable protein  Egg whites or egg substitute Cold cuts made with lean meat or soy protein  Dairy Nonfat (skim), low-fat, or 1%-fat milk  Nonfat or low-fat yogurt or cottage cheese Fat-free and low-fat cheese  Vegetables Fresh, frozen, or canned vegetables without added fat or salt   Fruits Fresh, frozen, canned, or dried fruit   Oils Unsaturated oils (corn, olive, peanut, soy, sunflower, canola)  Soft or liquid margarines and vegetable oil spreads  Salad dressings Seeds and nuts  Avocado   Foods Not Recommended Food Group Foods Not Recommended  Grains Breads or crackers topped with salt Cereals (hot or cold) with more than 300 mg sodium per serving Biscuits, cornbread, and other "quick" breads prepared with baking soda Bread crumbs or stuffing mix from a store High-fat bakery products, such as doughnuts, biscuits, croissants, danish pastries, pies, cookies Instant cooking foods to which you add hot water and stir--potatoes, noodles, rice, etc. Packaged starchy foods--seasoned noodle or rice dishes, stuffing mix, macaroni and cheese dinner Snacks made with partially hydrogenated oils, including chips, cheese puffs, snack mixes, regular crackers, butter-flavored popcorn  Protein Foods  Higher-fat cuts of meats (ribs, t-bone steak, regular hamburger) Bacon, sausage, or hot dogs Cold cuts, such as salami or bologna, deli meats, cured meats, corned beef Organ meats (liver, brains, gizzards, sweetbreads) Poultry with skin Fried or smoked meat, poultry, and fish Whole eggs and egg yolks (more than 2-4 per week) Salted legumes, nuts, seeds, or nut/seed butters Meat alternatives with high levels of sodium (>300 mg per serving) or saturated fat (>5 g per serving)  Dairy Whole milk,?2% fat milk, buttermilk Whole milk yogurt or ice cream Cream Half-&-half Cream cheese Sour  cream Cheese  Vegetables Canned or frozen vegetables with salt, fresh vegetables prepared with salt, butter, cheese, or cream sauce Fried vegetables Pickled vegetables such as olives, pickles, or sauerkraut  Fruits Fried fruits Fruits served with butter or cream  Oils Butter, stick margarine, shortening Partially hydrogenated oils or trans fats Tropical oils (coconut, palm, palm kernel oils)  Other Candy, sugar sweetened soft drinks and desserts Salt, sea salt, garlic salt, and seasoning mixes containing salt Bouillon cubes Ketchup, barbecue sauce, Worcestershire sauce, soy sauce, teriyaki sauce Miso Salsa Pickles, olives, relish   Heart Healthy Consistent Carbohydrate Vegetarian (Lacto-Ovo) Sample 1-Day Menu  Breakfast 1 cup oatmeal, cooked (2 carbohydrate servings)   cup blueberries (1 carbohydrate serving)  11 almonds, without salt  1 cup 1% milk (1 carbohydrate serving)  1 cup coffee  Morning Snack 1 cup fat-free plain yogurt (1 carbohydrate serving)  Lunch 1 whole wheat bun (1 carbohydrate servings)  1 black bean burger (1 carbohydrate servings)  1 slice cheddar cheese, low sodium  2 slices tomatoes  2 leaves lettuce  1 teaspoon mustard  1 small pear (1 carbohydrate servings)  1 cup green tea, unsweetened  Afternoon Snack 1/3 cup trail mix with nuts, seeds, and raisins, without salt (1 carbohydrate servinga)  Evening Meal  cup meatless chicken  2/3 cup brown rice, cooked (2 carbohydrate servings)  1 cup broccoli, cooked (2/3 carbohydrate serving)   cup carrots, cooked (1/3 carbohydrate serving)  2 teaspoons olive oil  1 teaspoon balsamic vinegar  1 whole wheat dinner roll (1 carbohydrate serving)  1 teaspoon margarine, soft, tub  1 cup 1% milk (1 carbohydrate serving)  Evening Snack 1 extra small banana (1 carbohydrate serving)  1 tablespoon peanut butter   Heart Healthy Consistent Carbohydrate Vegan Sample 1-Day Menu  Breakfast 1 cup oatmeal, cooked (2  carbohydrate servings)   cup blueberries (1 carbohydrate serving)  11 almonds, without salt  1 cup soymilk fortified with calcium , vitamin B12, and vitamin D   1 cup coffee  Morning Snack 6 ounces soy yogurt (1 carbohydrate servings)  Lunch 1 whole wheat bun(1 carbohydrate servings)  1 black bean burger (1 carbohydrate serving)  2 slices tomatoes  2 leaves lettuce  1 teaspoon mustard  1 small pear (1 carbohydrate servings)  1 cup green tea, unsweetened  Afternoon Snack 1/3 cup trail mix with nuts, seeds, and raisins, without salt (1 carbohydrate servings)  Evening Meal  cup meatless chicken  2/3 cup brown rice, cooked (2 carbohydrate servings)  1 cup broccoli, cooked (2/3 carbohydrate serving)   cup carrots, cooked (1/3 carbohydrate serving)  2 teaspoons olive oil  1 teaspoon balsamic vinegar  1 whole wheat dinner roll (1 carbohydrate serving)  1 teaspoon margarine, soft, tub  1 cup soymilk fortified with calcium , vitamin B12, and vitamin D   Evening Snack 1 extra small banana (1 carbohydrate serving)  1 tablespoon peanut butter    Heart Healthy Consistent Carbohydrate Sample  1-Day Menu  Breakfast 1 cup cooked oatmeal (2 carbohydrate servings)  3/4 cup blueberries (1 carbohydrate serving)  1 ounce almonds  1 cup skim milk (1 carbohydrate serving)  1 cup coffee  Morning Snack 1 cup sugar-free nonfat yogurt (1 carbohydrate serving)  Lunch 2 slices whole-wheat bread (2 carbohydrate servings)  2 ounces lean [place] breast  1 ounce low-fat Swiss cheese  1 teaspoon mustard  1 slice tomato  1 lettuce leaf  1 small pear (1 carbohydrate serving)  1 cup skim milk (1 carbohydrate serving)  Afternoon Snack 1 ounce trail mix with unsalted nuts, seeds, and raisins (1 carbohydrate serving)  Evening Meal 3 ounces salmon  2/3 cup cooked brown rice (2 carbohydrate servings)  1 teaspoon soft margarine  1 cup cooked broccoli with 1/2 cup cooked carrots (1 carbohydrate serving  Carrots,  cooked, boiled, drained, without salt  1 cup lettuce  1 teaspoon olive oil with vinegar for dressing  1 small whole grain roll (1 carbohydrate serving)  1 teaspoon soft margarine  1 cup unsweetened tea  Evening Snack 1 extra-small banana (1 carbohydrate serving)  Copyright 2020  [employer]y of [employer]. All rights reserved. Inpatient Rehab Discharge Instructions  Monica Austin Discharge date and time: No discharge date for patient encounter.   Activities/Precautions/ Functional Status: Activity: activity as tolerated Diet: diabetic diet Wound Care: Routine skin checks Functional status:  ___ No restrictions     ___ Walk up steps independently ___ 24/7 supervision/assistance   ___ Walk up steps with assistance ___ Intermittent supervision/assistance  ___ Bathe/dress independently ___ Walk with walker     _x__ Bathe/dress with assistance ___ Walk Independently    ___ Shower independently ___ Walk with assistance    ___ Shower with assistance ___ No alcohol     ___ Return to work/school ________  Special Instructions:    No driving smoking or alcohol      COMMUNITY REFERRALS UPON DISCHARGE:    Home Health:   PT   OT     SP  AIDE                  Agency:[facility]    Phone:[phone]    Medical Equipment/Items Ordered:ROLLING WALKER AND TUB BENCH                                                 Agency/Supplier:[facility]    [phone]  PRIVATE DUTY AIDE LIST GIVEN TO SON TO PURSUE IF NEEDED     STROKE/TIA DISCHARGE INSTRUCTIONS SMOKING Cigarette smoking nearly doubles your risk of having a stroke & is the single most alterable risk factor  If you smoke or have smoked in the last 12 months, you are advised to quit smoking for your health. Most of the excess cardiovascular risk related to smoking disappears within a year of stopping. Ask you doctor about anti-smoking medications [place] Quit Line: 1-800-QUIT NOW Free Smoking Cessation Classes  [phone]  CHOLESTEROL Know your levels; limit fat & cholesterol in your diet  Lipid Panel  No results found for: CHOL, TRIG, HDL, CHOLHDL, VLDL, LDLCALC   Many patients benefit from treatment even if their cholesterol is at goal. Goal: Total Cholesterol (CHOL) less than 160 Goal:  Triglycerides (TRIG) less than 150 Goal:  HDL greater than 40 Goal:  LDL (LDLCALC) less than  100   BLOOD PRESSURE American Stroke Association blood pressure target is less that 120/80 mm/Hg  Your discharge blood pressure is:  BP: (!) 159/75 Monitor your blood pressure Limit your salt and alcohol intake Many individuals will require more than one medication for high blood pressure  DIABETES (A1c is a blood sugar average for last 3 months) Goal HGBA1c is under 7% (HBGA1c is blood sugar average for last 3 months)  Diabetes:    Lab Results  Component Value Date   HGBA1C 9.7 (H) 06/23/2024    Your HGBA1c can be lowered with medications, healthy diet, and exercise. Check your blood sugar as directed by your physician Call your physician if you experience unexplained or low blood sugars.  PHYSICAL ACTIVITY/REHABILITATION Goal is 30 minutes at least 4 days per week  Activity: Increase activity slowly, Therapies: Physical Therapy: Home Health Return to work:  Activity decreases your risk of heart attack and stroke and makes your heart stronger.  It helps control your weight and blood pressure; helps you relax and can improve your mood. Participate in a regular exercise program. Talk with your doctor about the best form of exercise for you (dancing, walking, swimming, cycling).  DIET/WEIGHT Goal is to maintain a healthy weight  Your discharge diet is:  Diet Order             DIET DYS 3 Room service appropriate? Yes with Assist; Fluid consistency: Thin  Diet effective now                   liquids Your height is:  Height: 5' 7 (170.2 cm) Your current weight is: Weight: 82.2 kg Your Body  Mass Index (BMI) is:  BMI (Calculated): 28.37 Following the type of diet specifically designed for you will help prevent another stroke. Your goal weight range is:   Your goal Body Mass Index (BMI) is 19-24. Healthy food habits can help reduce 3 risk factors for stroke:  High cholesterol, hypertension, and excess weight.  RESOURCES Stroke/Support Group:  Call [phone]   STROKE EDUCATION PROVIDED/REVIEWED AND GIVEN TO PATIENT Stroke warning signs and symptoms How to activate emergency medical system (call 911). Medications prescribed at discharge. Need for follow-up after discharge. Personal risk factors for stroke. Pneumonia vaccine given: No Flu vaccine given: No My questions have been answered, the writing is legible, and I understand these instructions.  I will adhere to these goals & educational materials that have been provided to me after my discharge from the hospital.      My questions have been answered and I understand these instructions. I will adhere to these goals and the provided educational materials after my discharge from the hospital.  Patient/Caregiver Signature _______________________________ Date __________  Clinician Signature _______________________________________ Date __________  Please bring this form and your medication list with you to all your follow-up doctor's appointments.

## 2024-06-26 NOTE — Plan of Care (Signed)

## 2024-06-26 NOTE — H&P (Signed)
 Physical Medicine and Rehabilitation Admission H&P    Chief Complaint  Patient presents with   Functional deficits due to stroke     HPI:  Monica Austin is a 68 year old R handed female with history of HTN, T2DM- A1c 9.7 (per pt usually ~6.5-7.0), hyperlipidemia, medication non-compliance; who was admitted to hospital while vacationing in [place]  on 06/21/24 with elevated BP- 216/66, left sided weakness, slurred speech and dysphagia. LSN the night before she went to bed and did not receive thrombolysis. She was found to have right basal ganglia infarct with moderate to severe narrowing B-PCA, moderate to severe narrowing R-VA, severe focal narrowing mid portion of L-VA, severe focal narrowing M2 L-MCA,  distal M1 R-MCA as well as incidental findings of diffuse enlargement of thyroid gland with multiple small hypodense nodules and multilevel cervical spondylosis. DAPT recommended. She left AMA, was noted to have issues with aspiration enroute to [facility] and presented to [facility] ED for evaluation and transferred to [facility] for management. Dr. Jerrie recommended 90 day duration of DAPT followed by ASA alone. She was started on rocephin as found to have E coli UTI and transitioned to Keflex to complete 7 day course of treatment. T2DM poorly controlled with A1C-9.7 and insulin recommended by diabetes coordinator.   Speech therapy evaluation showed oropharyngeal dysphagia with impaired mastication and penetration per MBS. Current recommendations are -D3, thin liquids recommended and if coughing persists try chin tuck (reduce amount of penetration) and remove straws. 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 She spends times between [facility] and [place]-->Lives with grandson and works for [employer] in [place]. She does not have  local [place]. She was independent PTA and CIR recommended due to functional decline.   Pt reports no dysuria or urinary issues, but has urinary incontinence garments in room.  Voiding well.   Said she feels really sleepy today.  Also said had N/V last night and this AM- due to dinner. Also meds got stuck and lovenox made her vomit.   LBM today- denies constipation.   Review of Systems  Constitutional:  Negative for fever.  Eyes:  Negative for blurred vision and double vision.  Respiratory:  Negative for shortness of breath.   Cardiovascular:  Negative for chest pain and palpitations.  Gastrointestinal:  Negative for abdominal pain, constipation and heartburn.  Genitourinary:  Negative for dysuria and frequency.       Incontinence garments in room  Musculoskeletal:  Negative for back pain and myalgias.  Neurological:  Positive for speech change and weakness. Negative for headaches.  Psychiatric/Behavioral:  The patient is not nervous/anxious and does not have insomnia.   All other systems reviewed and are negative.    Past Medical History:  Diagnosis Date   Fatigue    Hyperlipidemia    Hypertension    NIDDM (non-insulin dependent diabetes mellitus)    Borderline    Past Surgical History:  Procedure Laterality Date   BREAST BIOPSY     BTL     COLONOSCOPY  02/17/2020   HERNIA REPAIR     OVARIAN CYST REMOVAL      Family History  Problem Relation Age of Onset   COPD Mother    Cancer Father    Colon polyps Sister    Colon cancer Neg Hx    Esophageal cancer Neg Hx  Pancreatic cancer Neg Hx    Stomach cancer Neg Hx     Social History:  Lives with grandson (68 years old) daughter to from [place] to assist after d/c. She  reports that she has never smoked. She has never used smokeless tobacco. She reports that she does not currently use alcohol. She reports that she does not use drugs.  :  Allergies  Allergen Reactions   Zestril [Lisinopril] Swelling    Tongue swelling    Zocor [Simvastatin] Swelling    Medications Prior to Admission  Medication Sig Dispense Refill   calcium carbonate (OS-CAL - DOSED IN MG OF ELEMENTAL CALCIUM) 1250 (500 Ca) MG tablet Take 1 tablet by mouth daily.     metFORMIN (GLUCOPHAGE) 850 MG tablet Take 850 mg by mouth daily.     montelukast (SINGULAIR) 10 MG tablet Take 10 mg by mouth at bedtime.     rosuvastatin (CRESTOR) 10 MG tablet Take 10 mg by mouth daily.     ezetimibe (ZETIA) 10 MG tablet Take 10 mg by mouth daily. (Patient not taking: Reported on 06/23/2024)     metoprolol succinate (TOPROL-XL) 25 MG 24 hr tablet Take 25 mg by mouth daily. (Patient not taking: Reported on 06/23/2024)        Home: Home Living Family/patient expects to be discharged to:: Private residence Living Arrangements: Other relatives Available Help at Discharge: Family, Available 24 hours/day Type of Home: House (has a house in [place], which her grandson also stays at, but she also goes to [place] for periods of time to work and she stays with her daughter while up there) Home Access: Stairs to enter Secretary/administrator of Steps: 6-7 Entrance Stairs-Rails: Right, Left Home Layout: One level, Laundry or work area in basement [employer] Shower/Tub: Engineer, manufacturing systems: Standard (comfort) Home Equipment: Grab bars - tub/shower, Hand held shower head  Lives With: Family   Functional History: Prior Function Prior Level of Function : Independent/Modified Independent, Driving, Working/employed ADLs Comments: works in Engineer, mining; predominantly desk/phone calls  Functional Status:  Mobility: Bed Mobility Overal bed mobility: Needs Assistance Bed Mobility: Supine to Sit, Sit to Supine Supine to sit: Contact guard Sit to supine: Contact guard assist General bed mobility comments: CGA with trunk to rise into sitting.  Pt required increased time and effort to scoot to edge of bed to prepare for t/f  training. Transfers Overall transfer level: Needs assistance Equipment used: Rollator (4 wheels) Transfers: Sit to/from Stand Sit to Stand: Min assist General transfer comment: min assist to power up and max VCs for locking rollator breaks buring sit<>stand transfers,  Presents with poor eccentric load and not backing fully to seated surface before sitting. Ambulation/Gait Ambulation/Gait assistance: Min assist, +2 safety/equipment Gait Distance (Feet): 60 Feet (x3 trials with standing rest breaks to readjust in rollator and avoid pushing device too far forward.) Assistive device: Rollator (4 wheels) Gait Pattern/deviations: Step-through pattern, Decreased step length - left, Decreased stride length, Decreased stance time - left, Decreased dorsiflexion - left, Drifts right/left General Gait Details: Pt was able to ambulate with rollator needing cues to increased BOS and stay close to rollator. Pt at times needed cues to step left LE as she shortens step length.  Appears to have decr attention to left side with poor awareness of where left UE was as well as not tracking fully to left without cuing. Pt also impulsive and walking too quickly intiialy.  Cues to slow down gt speed and promote safety.  Poor balance noted with heavy min assistance for righting. Gait velocity interpretation: <1.8 ft/sec, indicate of risk for recurrent falls    ADL: ADL Overall ADL's : Needs assistance/impaired Eating/Feeding: Set up, Sitting Eating/Feeding Details (indicate cue type and reason): uses RUE to feed self, requires setup Grooming: Wash/dry hands, Wash/dry face, Contact guard assist, Standing Grooming Details (indicate cue type and reason): at sink Upper Body Bathing: Supervision/ safety, Sitting, Cueing for sequencing Upper Body Bathing Details (indicate cue type and reason): in shower Lower Body Bathing: Minimal assistance, Sit to/from stand, Cueing for sequencing Upper Body Dressing : Minimal  assistance, Sitting Upper Body Dressing Details (indicate cue type and reason): to donn pullover top Lower Body Dressing: Minimal assistance, Sit to/from stand Lower Body Dressing Details (indicate cue type and reason): patient donned pullup briefs and socks with min assist Toilet Transfer: Minimal assistance, Ambulation, Rolling walker (2 wheels) Toilet Transfer Details (indicate cue type and reason): CGA progressing to min A Tub/ Shower Transfer: Walk-in shower, Minimal assistance, Shower seat, Engineer, technical sales Details (indicate cue type and reason): cues for safety and grab bars use Functional mobility during ADLs: Minimal assistance, Contact guard assist, Rollator (4 wheels) General ADL Comments: required cues for safety and sequencing. Son present and encouraged to allow patient to perform tasks  Cognition: Cognition Overall Cognitive Status: Impaired/Different from baseline Arousal/Alertness: Awake/alert Orientation Level: Oriented X4 Attention: Sustained Sustained Attention: Appears intact Memory: Impaired Memory Impairment: Decreased recall of new information Awareness: Impaired Awareness Impairment: Intellectual impairment, Other (comment) (online awareness) Problem Solving: Impaired Problem Solving Impairment: Verbal basic Safety/Judgment: Impaired Cognition Arousal: Alert Behavior During Therapy: WFL for tasks assessed/performed, Flat affect Overall Cognitive Status: Impaired/Different from baseline  Physical Exam: Blood pressure (!) 144/69, pulse 62, temperature 98 F (36.7 C), resp. rate 17, height 5' 7 (1.702 m), weight 84 kg, SpO2 95%. Physical Exam Vitals and nursing note reviewed.  Constitutional:      Appearance: Normal appearance. She is obese.     Comments: Pt sitting up in bed; awake, alert, NAD- on phone shopping  HENT:     Head: Normocephalic and atraumatic.     Comments: L facial droop L tongue deviation Facial sensation intact    Right  Ear: External ear normal.     Left Ear: External ear normal.     Nose: Nose normal. No congestion.     Mouth/Throat:     Mouth: Mucous membranes are dry.     Pharynx: Oropharynx is clear. No oropharyngeal exudate.  Eyes:     General:        Right eye: No discharge.        Left eye: No discharge.     Extraocular Movements: Extraocular movements intact.  Cardiovascular:     Rate and Rhythm: Normal rate and regular rhythm.     Heart sounds: Normal heart sounds. No murmur heard.    No gallop.  Pulmonary:     Effort: Pulmonary effort is normal. No respiratory distress.     Breath sounds: Normal breath sounds. No wheezing, rhonchi or rales.  Abdominal:     General: Bowel sounds are normal. There is no distension.     Palpations: Abdomen is soft.     Tenderness: There is no abdominal tenderness.  Musculoskeletal:     Cervical back: Neck supple. No tenderness.     Comments: RUE 5/5 thorughout LUE- ~4/5 ina ll muscles except FA which is 2+/5 RLE- 5/5 throughout LLE- 4+ throughout   Skin:  General: Skin is warm and dry.     Comments: No wounds seen  Neurological:     Mental Status: She is alert.     Comments: Left facial weakness with mild dysarthria. Able to answer orientation questions without difficulty and follow simple one and two step commands. Left sided weakness.  Knows in [facility] on July 3rd 2025 and why here, however slowed /delayed responses- also knows next holiday is 4th of July- tomorrow.  Intact to light touch in all 4 extremities   Psychiatric:     Comments: Extremely flat     Results for orders placed or performed during the hospital encounter of 06/23/24 (from the past 48 hours)  Glucose, capillary     Status: Abnormal   Collection Time: 06/24/24 11:35 AM  Result Value Ref Range   Glucose-Capillary 163 (H) 70 - 99 mg/dL    Comment: Glucose reference range applies only to samples taken after fasting for at least 8 hours.  Glucose, capillary     Status:  Abnormal   Collection Time: 06/24/24  4:34 PM  Result Value Ref Range   Glucose-Capillary 174 (H) 70 - 99 mg/dL    Comment: Glucose reference range applies only to samples taken after fasting for at least 8 hours.  Glucose, capillary     Status: Abnormal   Collection Time: 06/24/24  7:47 PM  Result Value Ref Range   Glucose-Capillary 230 (H) 70 - 99 mg/dL    Comment: Glucose reference range applies only to samples taken after fasting for at least 8 hours.  Glucose, capillary     Status: Abnormal   Collection Time: 06/25/24  9:14 AM  Result Value Ref Range   Glucose-Capillary 245 (H) 70 - 99 mg/dL    Comment: Glucose reference range applies only to samples taken after fasting for at least 8 hours.  Glucose, capillary     Status: Abnormal   Collection Time: 06/25/24 12:04 PM  Result Value Ref Range   Glucose-Capillary 155 (H) 70 - 99 mg/dL    Comment: Glucose reference range applies only to samples taken after fasting for at least 8 hours.  Glucose, capillary     Status: None   Collection Time: 06/25/24  5:15 PM  Result Value Ref Range   Glucose-Capillary 90 70 - 99 mg/dL    Comment: Glucose reference range applies only to samples taken after fasting for at least 8 hours.  Glucose, capillary     Status: Abnormal   Collection Time: 06/25/24  9:26 PM  Result Value Ref Range   Glucose-Capillary 145 (H) 70 - 99 mg/dL    Comment: Glucose reference range applies only to samples taken after fasting for at least 8 hours.  Glucose, capillary     Status: Abnormal   Collection Time: 06/26/24  8:10 AM  Result Value Ref Range   Glucose-Capillary 151 (H) 70 - 99 mg/dL    Comment: Glucose reference range applies only to samples taken after fasting for at least 8 hours.   DG Swallowing Func-Speech Pathology Result Date: 06/24/2024 Table formatting from the original result was not included. Modified Barium Swallow Study Patient Details Name: Monica Austin MRN: [id number] Date of Birth: 08/23/1956  Today's Date: 06/24/2024 HPI/PMH: HPI: 68 yo recently admitted to hospital in [facility] while on vacation and dx with R basal ganglia stroke. SLP clinical swallow eval completed there on 06/22/24 reported impaired mastication, and IDDSI diet level 7 (easy to chew) was recommended. No overt pharyngeal symptoms  observed but subjectively pt reported that her saliva felt like it went down the wrong way intermittently so MBS was recommended. Report not available so not sure if this was completed prior to pt leaving AMA 6/29, driving to [place] with family and reporting difficulty swallowing along the way. She presented directly to [facility] at the recommendation of [facility] in [facility]. PMH significant for HTN, HLD, DMII. Clinical Impression: Clinical Impression: Pt presents with an oral more than pharyngeal dysphagia. She has reduced labial seal, slow mastication, and delayed lingual transport. Oral deficits lead to premature loss of the bolus into the pharynx, with thin and nectar thick liquids also entering the laryngeal vestibule before the swallow. She has pretty good protective mechanisms of her airway, so that even when larger amounts of penetration occur (like with thin liquids via straw), she can almost always clear her laryngeal vestibule. If it does not occur immediately, it does on subsequent swallow. A chin tuck helped to reduce the volume of penetration compared to straw sips in a neutral position, but did not prevent penetration. Recommend that she continue with Dys 3 (mechanical soft) solids and thin liquids. If coughing persists at meal times, could consider further modifications (remove straws, try chin tuck, or thicken to nectar). Would still offer pills with puree as pt had difficulty clearing the barium tablet from her oral cavity. When she ultimately did, it was transiently retained in the esophagus before clearing with a liquid wash. Factors that may increase risk of adverse event in presence of aspiration Noe &  Lianne 2021): Factors that may increase risk of adverse event in presence of aspiration Noe & Lianne 2021): Reduced cognitive function; Limited mobility Recommendations/Plan: Swallowing Evaluation Recommendations Swallowing Evaluation Recommendations Recommendations: PO diet PO Diet Recommendation: Dysphagia 3 (Mechanical soft); Thin liquids (Level 0) Liquid Administration via: Cup; Straw Medication Administration: Whole meds with puree Supervision: Patient able to self-feed; Intermittent supervision/cueing for swallowing strategies; Set-up assistance for safety Swallowing strategies  : Slow rate; Small bites/sips; Check for anterior loss Postural changes: Position pt fully upright for meals Oral care recommendations: Oral care BID (2x/day) Treatment Plan Treatment Plan Treatment recommendations: Therapy as outlined in treatment plan below Follow-up recommendations: Acute inpatient rehab (3 hours/day) Functional status assessment: Patient has had a recent decline in their functional status and demonstrates the ability to make significant improvements in function in a reasonable and predictable amount of time. Treatment frequency: Min 2x/week Treatment duration: 2 weeks Interventions: Aspiration precaution training; Compensatory techniques; Patient/family education; Trials of upgraded texture/liquids; Diet toleration management by SLP Recommendations Recommendations for follow up therapy are one component of a multi-disciplinary discharge planning process, led by the attending physician.  Recommendations may be updated based on patient status, additional functional criteria and insurance authorization. Assessment: Orofacial Exam: Orofacial Exam Oral Cavity - Dentition: Adequate natural dentition Anatomy: Anatomy: WFL Boluses Administered: Boluses Administered Boluses Administered: Thin liquids (Level 0); Mildly thick liquids (Level 2, nectar thick); Moderately thick liquids (Level 3, honey thick); Puree; Solid   Oral Impairment Domain: Oral Impairment Domain Lip Closure: Interlabial escape, no progression to anterior lip Tongue control during bolus hold: Cohesive bolus between tongue to palatal seal Bolus preparation/mastication: Slow prolonged chewing/mashing with complete recollection Bolus transport/lingual motion: Delayed initiation of tongue motion (oral holding) Oral residue: Complete oral clearance Location of oral residue : N/A Initiation of pharyngeal swallow : Pyriform sinuses  Pharyngeal Impairment Domain: Pharyngeal Impairment Domain Soft palate elevation: No bolus between soft palate (SP)/pharyngeal wall (PW) Laryngeal elevation: Complete superior  movement of thyroid cartilage with complete approximation of arytenoids to epiglottic petiole Anterior hyoid excursion: Complete anterior movement Epiglottic movement: Complete inversion Laryngeal vestibule closure: Complete, no air/contrast in laryngeal vestibule Pharyngeal stripping wave : Present - complete Pharyngeal contraction (A/P view only): N/A Pharyngoesophageal segment opening: Complete distension and complete duration, no obstruction of flow Tongue base retraction: No contrast between tongue base and posterior pharyngeal wall (PPW) Pharyngeal residue: Complete pharyngeal clearance Location of pharyngeal residue: N/A  Esophageal Impairment Domain: Esophageal Impairment Domain Esophageal clearance upright position: Complete clearance, esophageal coating Pill: Pill Consistency administered: Thin liquids (Level 0) Thin liquids (Level 0): Impaired (see clinical impressions) Penetration/Aspiration Scale Score: Penetration/Aspiration Scale Score 1.  Material does not enter airway: Moderately thick liquids (Level 3, honey thick); Puree; Solid; Pill 2.  Material enters airway, remains ABOVE vocal cords then ejected out: Thin liquids (Level 0); Mildly thick liquids (Level 2, nectar thick) Compensatory Strategies: Compensatory Strategies Compensatory strategies: Yes  Straw: Effective Effective Straw: Thin liquid (Level 0) Chin tuck: Effective Effective Chin Tuck: Thin liquid (Level 0)   General Information: Caregiver present: No  Diet Prior to this Study: Dysphagia 3 (mechanical soft); Thin liquids (Level 0)   Temperature : Normal   Respiratory Status: WFL   Supplemental O2: None (Room air)   History of Recent Intubation: No  Behavior/Cognition: Alert; Cooperative; Pleasant mood; Requires cueing Self-Feeding Abilities: Able to self-feed Baseline vocal quality/speech: Normal No data recorded Volitional Swallow: Able to elicit Exam Limitations: No limitations Goal Planning: Prognosis for improved oropharyngeal function: Good Barriers to Reach Goals: Cognitive deficits No data recorded Patient/Family Stated Goal: none stated Consulted and agree with results and recommendations: Patient Pain: Pain Assessment Pain Assessment: Faces Faces Pain Scale: 0 End of Session: Start Time:SLP Start Time (ACUTE ONLY): 1245 Stop Time: SLP Stop Time (ACUTE ONLY): 1301 Time Calculation:SLP Time Calculation (min) (ACUTE ONLY): 16 min Charges: SLP Evaluations $ SLP Speech Visit: 1 Visit SLP Evaluations $BSS Swallow: 1 Procedure $MBS Swallow: 1 Procedure $ SLP EVAL LANGUAGE/SOUND PRODUCTION: 1 Procedure SLP visit diagnosis: SLP Visit Diagnosis: Dysphagia, oral phase (R13.11) Past Medical History: Past Medical History: Diagnosis Date  Fatigue   Hyperlipidemia   Hypertension   NIDDM (non-insulin dependent diabetes mellitus)   Borderline Past Surgical History: Past Surgical History: Procedure Laterality Date  BREAST BIOPSY    BTL    COLONOSCOPY  02/17/2020  HERNIA REPAIR    OVARIAN CYST REMOVAL   Monica Austin., M.A. CCC-SLP Acute Rehabilitation Services Office: [phone] Secure chat preferred 06/24/2024, 1:43 PM     Blood pressure (!) 144/69, pulse 62, temperature 98 F (36.7 C), resp. rate 17, height 5' 7 (1.702 m), weight 84 kg, SpO2 95%.  Medical Problem List and Plan: 1. Functional deficits  secondary to R BG stroke/R MCA stroke  -patient may  shower  -ELOS/Goals: 10-14 days- supervision to mod I- needs PT, OT and SLP  Admit to CIR 2.  Antithrombotics: -DVT/anticoagulation:  Pharmaceutical: Lovenox  -antiplatelet therapy: DAPT X 3 months followed by ASA 3. Pain Management: tylenol prn.  4. Mood/Behavior/Sleep: LCSW to follow for evaluation and support.   -antipsychotic agents: N/A 5. Neuropsych/cognition: This patient is capable of making decisions on her own behalf. 6. Skin/Wound Care: Routine pressure relief measures.  7. Fluids/Electrolytes/Nutrition: Monitor I/O. Check CMET in am.  8. E coli UTI: Keflex antibiotic D#3/7 9. HTN: Monitor BP TID--continue. Avoid hypotension with evidence of intracranial stenosis.  --metoprolol daily 10.  T2DM: Hgb A1c- 9.7. Monitor BS ac/hs and use  SSI for elevated BS. Add CM restrictions to diet.  --Was on metformin PTA with poor control.  --now on Insulin glargline. Increase metformin to 1000 mg bid/decrease SSI to sensitive.    --RD consulted for education on carb modified diet and Rehab RN for insulin education.  11. Dyslipidemia: On Crestor.  12.  Mild intermittent asthma: On Singulair 13. Thyroid nodules/enlargement: Ultrasound for work up after discharge.     Monica GORMAN Schmitz, PA-C 06/26/2024  I have personally performed a face to face diagnostic evaluation of this patient and formulated the key components of the plan.  Additionally, I have personally reviewed laboratory data, imaging studies, as well as relevant notes and concur with the physician assistant's documentation above.   The patient's status has not changed from the original H&P.  Any changes in documentation from the acute care chart have been noted above.

## 2024-06-26 NOTE — Progress Notes (Signed)
 Nutrition Consult  Received consult for diet education. Patient is currently on a dysphagia 3 / carb modified diet with thin liquids. Unable to reach patient by phone. Provided Heart Healthy Consistent Carbohydrate Nutrition Therapy handout from the Academy of [employer] Dietetics in discharge instructions. RD to follow-up as able on a later date/time to provide further education.   Suzen HUNT RD, LDN, CNSC Contact via secure chat. If unavailable, use group chat RD Inpatient.

## 2024-06-26 NOTE — Progress Notes (Signed)
 Speech Language Pathology Treatment: Dysphagia;Cognitive-Linguistic  Patient Details Name: Monica Austin MRN: [id number] DOB: Nov 06, 1956 Today's Date: 06/26/2024 Time: 8942-8892 SLP Time Calculation (min) (ACUTE ONLY): 10 min  Assessment / Plan / Recommendation Clinical Impression  Pt says she has had little coughing on POs since MBS was completed, but she is still concerned about potentially choking. During PO trials today there were no overt s/s of aspiration, but she does have L buccal pocketing. SLP provided Min verbal/visual cues for pt to self-manage with use of a lingual sweep. Throughout PO intake pt also discussed plan to move to CIR today. She showed intellectual awareness of several physical changes for which she would like to receive therapy (walking, trying stairs, going to the bathroom). She needed Min cues to identify any areas to address regarding communication and swallowing. She remains a good candidate for intensive SLP f/u upon discharge.    HPI HPI: 68 yo recently admitted to hospital in [facility] while on vacation and dx with R basal ganglia stroke. SLP clinical swallow eval completed there on 06/22/24 reported impaired mastication, and IDDSI diet level 7 (easy to chew) was recommended. No overt pharyngeal symptoms observed but subjectively pt reported that her saliva felt like it went down the wrong way intermittently so MBS was recommended. Report not available so not sure if this was completed prior to pt leaving AMA 6/29, driving to [place] with family and reporting difficulty swallowing along the way. She presented directly to [facility] at the recommendation of MD in [facility]. PMH significant for HTN, HLD, DMII.      SLP Plan  Continue with current plan of care          Recommendations  Diet recommendations: Dysphagia 3 (mechanical soft);Thin liquid Liquids provided via: Cup;Straw Medication Administration: Whole meds with puree Supervision: Patient able to self  feed;Intermittent supervision to cue for compensatory strategies Compensations: Minimize environmental distractions;Slow rate;Small sips/bites;Monitor for anterior loss Postural Changes and/or Swallow Maneuvers: Seated upright 90 degrees                  Oral care BID   Frequent or constant Supervision/Assistance Dysphagia, oral phase (R13.11)     Continue with current plan of care     Leita SAILOR., M.A. CCC-SLP Acute Rehabilitation Services Office: [phone]  Secure chat preferred   06/26/2024, 11:21 AM

## 2024-06-26 NOTE — Progress Notes (Signed)
 Inpatient Rehab Admissions Coordinator:    I have insurance approval and a bed available for pt to admit to CIR today. Dr. Raenelle in agreement.  Will let pt/family and TOC team know.   Reche Lowers, PT, DPT Admissions Coordinator [phone] 06/26/24  10:31 AM

## 2024-06-26 NOTE — Discharge Summary (Signed)
 Physician Discharge Summary  Monica Austin FMW:[id number] DOB: 1956-11-06 DOA: 06/23/2024  PCP: Pcp, No  Admit date: 06/23/2024 Discharge date: 06/26/2024  Admitted From: Home Disposition: Acute inpatient rehab  Recommendations for Outpatient Follow-up:  Follow up with PCP in 1-2 weeks Schedule follow-up with neurology on discharge  Home Health: N/A Equipment/Devices: N/A  Discharge Condition: Stable CODE STATUS: Full code Diet recommendation: Low-salt and low-carb diet  Discharge summary: 68 year old with history of hypertension, hyperlipidemia, type 2 diabetes presented to the hospital with 3 days of left sided facial droop and weakness.  Patient was vacationing in [place] , had acute stroke 6/27 and was admitted to the local hospital.  She had stroke workup done that showed 2.5 cm acute infarct in the right basal ganglia.  She left AGAINST MEDICAL ADVICE and came to the [facility].  Admitted to complete the stroke workup and rehab.  Seen by neurology.  Stabilized and going to rehab today.    Acute right MCA stroke: Clinical findings, left-sided weakness, dysphagia and left facial droop. MRI of the brain, right basal ganglia stroke CT angiogram head and neck, multifocal intracranial atherosclerotic disease. 2D echocardiogram, EF 60%.  No intracardiac thrombus. Antiplatelet therapy, LDL 71. Hemoglobin A1c, 9.7. Patient had completed stroke workup at outside hospital before coming to the emergency room.  Seen by neurology.  Recommendations are Aspirin Plavix for 90 days and continue aspirin alone.  Follow-up with neurology. Acute inpatient rehab. Speech therapy continues to follow. Glycemic control as below.   Type 2 diabetes, uncontrolled with hyperglycemia: At home on metformin 850 mg daily.  A1c 9.7.  Increase metformin 850 mg twice daily Increase long-acting insulin to 12 units daily. Will need further titration.    Essential hypertension blood pressure is elevated.   Metoprolol restarted.   Hyperlipidemia: Crestor 20 mg daily to continue.   Acute UTI present on admission: Pansensitive E. coli.  Rocephin x 3.  Keflex 500 mg 3 times daily for 4 more days to complete total 7 days of therapy.   Medically stable to transfer to rehab level of care.  Discharge Diagnoses:  Principal Problem:   CVA (cerebral vascular accident) (HCC) Active Problems:   UTI (urinary tract infection)   Uncontrolled type 2 diabetes mellitus with hyperglycemia (HCC)   Essential hypertension   Hyperlipidemia   Acute stroke due to ischemia [facility])    Discharge Instructions  Discharge Instructions     Diet - low sodium heart healthy   Complete by: As directed    Diet Carb Modified   Complete by: As directed    Increase activity slowly   Complete by: As directed       Allergies as of 06/26/2024       Reactions   Zestril [lisinopril] Swelling   Tongue swelling   Zocor [simvastatin] Swelling        Medication List     STOP taking these medications    ezetimibe 10 MG tablet Commonly known as: ZETIA       TAKE these medications    aspirin EC 81 MG tablet Take 1 tablet (81 mg total) by mouth daily. Swallow whole. Start taking on: June 27, 2024   calcium carbonate 1250 (500 Ca) MG tablet Commonly known as: OS-CAL - dosed in mg of elemental calcium Take 1 tablet by mouth daily.   cephALEXin 500 MG capsule Commonly known as: KEFLEX Take 1 capsule (500 mg total) by mouth every 8 (eight) hours for 3 days.   clopidogrel 75 MG  tablet Commonly known as: PLAVIX Take 1 tablet (75 mg total) by mouth daily. Start taking on: June 27, 2024   insulin glargine-yfgn 100 UNIT/ML injection Commonly known as: SEMGLEE Inject 0.12 mLs (12 Units total) into the skin daily. Start taking on: June 27, 2024   metFORMIN 850 MG tablet Commonly known as: GLUCOPHAGE Take 1 tablet (850 mg total) by mouth 2 (two) times daily with a meal. What changed: when to take this    metoprolol succinate 25 MG 24 hr tablet Commonly known as: TOPROL-XL Take 25 mg by mouth daily.   montelukast 10 MG tablet Commonly known as: SINGULAIR Take 10 mg by mouth at bedtime.   rosuvastatin 20 MG tablet Commonly known as: CRESTOR Take 1 tablet (20 mg total) by mouth daily. What changed:  medication strength how much to take        Allergies  Allergen Reactions   Zestril [Lisinopril] Swelling    Tongue swelling   Zocor [Simvastatin] Swelling    Consultations: Neurology   Procedures/Studies: DG Swallowing Func-Speech Pathology Result Date: 06/24/2024 Table formatting from the original result was not included. Modified Barium Swallow Study Patient Details Name: Monica Austin MRN: [id number] Date of Birth: 03-25-1956 Today's Date: 06/24/2024 HPI/PMH: HPI: 68 yo recently admitted to hospital in [place] while on vacation and dx with R basal ganglia stroke. SLP clinical swallow eval completed there on 06/22/24 reported impaired mastication, and IDDSI diet level 7 (easy to chew) was recommended. No overt pharyngeal symptoms observed but subjectively pt reported that her saliva felt like it went down the wrong way intermittently so MBS was recommended. Report not available so not sure if this was completed prior to pt leaving AMA 6/29, driving to [place] with family and reporting difficulty swallowing along the way. She presented directly to [facility] at the recommendation of MD in [facility]. PMH significant for HTN, HLD, DMII. Clinical Impression: Clinical Impression: Pt presents with an oral more than pharyngeal dysphagia. She has reduced labial seal, slow mastication, and delayed lingual transport. Oral deficits lead to premature loss of the bolus into the pharynx, with thin and nectar thick liquids also entering the laryngeal vestibule before the swallow. She has pretty good protective mechanisms of her airway, so that even when larger amounts of penetration occur (like with thin liquids via  straw), she can almost always clear her laryngeal vestibule. If it does not occur immediately, it does on subsequent swallow. A chin tuck helped to reduce the volume of penetration compared to straw sips in a neutral position, but did not prevent penetration. Recommend that she continue with Dys 3 (mechanical soft) solids and thin liquids. If coughing persists at meal times, could consider further modifications (remove straws, try chin tuck, or thicken to nectar). Would still offer pills with puree as pt had difficulty clearing the barium tablet from her oral cavity. When she ultimately did, it was transiently retained in the esophagus before clearing with a liquid wash. Factors that may increase risk of adverse event in presence of aspiration Noe & Lianne 2021): Factors that may increase risk of adverse event in presence of aspiration Noe & Lianne 2021): Reduced cognitive function; Limited mobility Recommendations/Plan: Swallowing Evaluation Recommendations Swallowing Evaluation Recommendations Recommendations: PO diet PO Diet Recommendation: Dysphagia 3 (Mechanical soft); Thin liquids (Level 0) Liquid Administration via: Cup; Straw Medication Administration: Whole meds with puree Supervision: Patient able to self-feed; Intermittent supervision/cueing for swallowing strategies; Set-up assistance for safety Swallowing strategies  : Slow rate; Small bites/sips; Check  for anterior loss Postural changes: Position pt fully upright for meals Oral care recommendations: Oral care BID (2x/day) Treatment Plan Treatment Plan Treatment recommendations: Therapy as outlined in treatment plan below Follow-up recommendations: Acute inpatient rehab (3 hours/day) Functional status assessment: Patient has had a recent decline in their functional status and demonstrates the ability to make significant improvements in function in a reasonable and predictable amount of time. Treatment frequency: Min 2x/week Treatment duration:  2 weeks Interventions: Aspiration precaution training; Compensatory techniques; Patient/family education; Trials of upgraded texture/liquids; Diet toleration management by SLP Recommendations Recommendations for follow up therapy are one component of a multi-disciplinary discharge planning process, led by the attending physician.  Recommendations may be updated based on patient status, additional functional criteria and insurance authorization. Assessment: Orofacial Exam: Orofacial Exam Oral Cavity - Dentition: Adequate natural dentition Anatomy: Anatomy: WFL Boluses Administered: Boluses Administered Boluses Administered: Thin liquids (Level 0); Mildly thick liquids (Level 2, nectar thick); Moderately thick liquids (Level 3, honey thick); Puree; Solid  Oral Impairment Domain: Oral Impairment Domain Lip Closure: Interlabial escape, no progression to anterior lip Tongue control during bolus hold: Cohesive bolus between tongue to palatal seal Bolus preparation/mastication: Slow prolonged chewing/mashing with complete recollection Bolus transport/lingual motion: Delayed initiation of tongue motion (oral holding) Oral residue: Complete oral clearance Location of oral residue : N/A Initiation of pharyngeal swallow : Pyriform sinuses  Pharyngeal Impairment Domain: Pharyngeal Impairment Domain Soft palate elevation: No bolus between soft palate (SP)/pharyngeal wall (PW) Laryngeal elevation: Complete superior movement of thyroid cartilage with complete approximation of arytenoids to epiglottic petiole Anterior hyoid excursion: Complete anterior movement Epiglottic movement: Complete inversion Laryngeal vestibule closure: Complete, no air/contrast in laryngeal vestibule Pharyngeal stripping wave : Present - complete Pharyngeal contraction (A/P view only): N/A Pharyngoesophageal segment opening: Complete distension and complete duration, no obstruction of flow Tongue base retraction: No contrast between tongue base and  posterior pharyngeal wall (PPW) Pharyngeal residue: Complete pharyngeal clearance Location of pharyngeal residue: N/A  Esophageal Impairment Domain: Esophageal Impairment Domain Esophageal clearance upright position: Complete clearance, esophageal coating Pill: Pill Consistency administered: Thin liquids (Level 0) Thin liquids (Level 0): Impaired (see clinical impressions) Penetration/Aspiration Scale Score: Penetration/Aspiration Scale Score 1.  Material does not enter airway: Moderately thick liquids (Level 3, honey thick); Puree; Solid; Pill 2.  Material enters airway, remains ABOVE vocal cords then ejected out: Thin liquids (Level 0); Mildly thick liquids (Level 2, nectar thick) Compensatory Strategies: Compensatory Strategies Compensatory strategies: Yes Straw: Effective Effective Straw: Thin liquid (Level 0) Chin tuck: Effective Effective Chin Tuck: Thin liquid (Level 0)   General Information: Caregiver present: No  Diet Prior to this Study: Dysphagia 3 (mechanical soft); Thin liquids (Level 0)   Temperature : Normal   Respiratory Status: WFL   Supplemental O2: None (Room air)   History of Recent Intubation: No  Behavior/Cognition: Alert; Cooperative; Pleasant mood; Requires cueing Self-Feeding Abilities: Able to self-feed Baseline vocal quality/speech: Normal No data recorded Volitional Swallow: Able to elicit Exam Limitations: No limitations Goal Planning: Prognosis for improved oropharyngeal function: Good Barriers to Reach Goals: Cognitive deficits No data recorded Patient/Family Stated Goal: none stated Consulted and agree with results and recommendations: Patient Pain: Pain Assessment Pain Assessment: Faces Faces Pain Scale: 0 End of Session: Start Time:SLP Start Time (ACUTE ONLY): 1245 Stop Time: SLP Stop Time (ACUTE ONLY): 1301 Time Calculation:SLP Time Calculation (min) (ACUTE ONLY): 16 min Charges: SLP Evaluations $ SLP Speech Visit: 1 Visit SLP Evaluations $BSS Swallow: 1 Procedure $MBS Swallow: 1  Procedure $  SLP EVAL LANGUAGE/SOUND PRODUCTION: 1 Procedure SLP visit diagnosis: SLP Visit Diagnosis: Dysphagia, oral phase (R13.11) Past Medical History: Past Medical History: Diagnosis Date  Fatigue   Hyperlipidemia   Hypertension   NIDDM (non-insulin dependent diabetes mellitus)   Borderline Past Surgical History: Past Surgical History: Procedure Laterality Date  BREAST BIOPSY    BTL    COLONOSCOPY  02/17/2020  HERNIA REPAIR    OVARIAN CYST REMOVAL   Leita SAILOR., M.A. CCC-SLP Acute Rehabilitation Services Office: [phone] Secure chat preferred 06/24/2024, 1:43 PM  (Echo, Carotid, EGD, Colonoscopy, ERCP)    Subjective: Patient seen and examined in the morning rounds.  Up in the chair.  Slight weakness on the left side otherwise denies any complaints.   Discharge Exam: Vitals:   06/26/24 0458 06/26/24 0812  BP: (!) 154/73 (!) 144/69  Pulse: 67 62  Resp: 17   Temp: 98 F (36.7 C) 98 F (36.7 C)  SpO2: 98% 95%   Vitals:   06/25/24 1715 06/25/24 2028 06/26/24 0458 06/26/24 0812  BP: (!) 143/72 133/62 (!) 154/73 (!) 144/69  Pulse: 63 (!) 56 67 62  Resp:  16 17   Temp: 98 F (36.7 C) 98.3 F (36.8 C) 98 F (36.7 C) 98 F (36.7 C)  TempSrc: Oral Oral Oral   SpO2: 97% 100% 98% 95%  Weight:      Height:        General: Pt is alert, awake, not in acute distress Sitting in chair.  Flat affect. Alert awake and oriented. Left facial droop present. Muscle strength right upper and lower extremities are normal.  Patient has mild weakness on the left upper and lower extremities. Cardiovascular: RRR, S1/S2 +, no rubs, no gallops Respiratory: CTA bilaterally, no wheezing, no rhonchi Abdominal: Soft, NT, ND, bowel sounds + Extremities: no edema, no cyanosis    The results of significant diagnostics from this hospitalization (including imaging, microbiology, ancillary and laboratory) are listed below for reference.     Microbiology: Recent Results (from the past 240 hours)  Urine  Culture (for pregnant, neutropenic or urologic patients or patients with an indwelling urinary catheter)     Status: Abnormal   Collection Time: 06/23/24  3:11 AM   Specimen: Urine, Clean Catch  Result Value Ref Range Status   Specimen Description URINE, CLEAN CATCH  Final   Special Requests   Final    NONE Performed at [facility], [address] [place], [place] [address]    Culture >=100,000 COLONIES/mL ESCHERICHIA COLI (A)  Final   Report Status 06/25/2024 FINAL  Final   Organism ID, Bacteria ESCHERICHIA COLI (A)  Final      Susceptibility   Escherichia coli - MIC*    AMPICILLIN <=2 SENSITIVE Sensitive     CEFAZOLIN <=4 SENSITIVE Sensitive     CEFEPIME <=0.12 SENSITIVE Sensitive     CEFTRIAXONE <=0.25 SENSITIVE Sensitive     CIPROFLOXACIN <=0.25 SENSITIVE Sensitive     GENTAMICIN <=1 SENSITIVE Sensitive     IMIPENEM <=0.25 SENSITIVE Sensitive     NITROFURANTOIN <=16 SENSITIVE Sensitive     TRIMETH/SULFA <=20 SENSITIVE Sensitive     AMPICILLIN/SULBACTAM <=2 SENSITIVE Sensitive     PIP/TAZO <=4 SENSITIVE Sensitive ug/mL    * >=100,000 COLONIES/mL ESCHERICHIA COLI     Labs: BNP (last 3 results) No results for input(s): BNP in the last 8760 hours. Basic Metabolic Panel: Recent Labs  Lab 06/23/24 0327 06/24/24 0341  NA 137 138  K 3.8 3.7  CL 103 106  CO2 24 25  GLUCOSE 253* 160*  BUN 16 14  CREATININE 1.01* 0.91  CALCIUM 9.6 9.2   Liver Function Tests: Recent Labs  Lab 06/23/24 0327  AST 21  ALT 20  ALKPHOS 72  BILITOT 0.5  PROT 7.4  ALBUMIN 3.8   No results for input(s): LIPASE, AMYLASE in the last 168 hours. No results for input(s): AMMONIA in the last 168 hours. CBC: Recent Labs  Lab 06/23/24 0327 06/24/24 0341  WBC 7.9 6.1  NEUTROABS 3.9  --   HGB 13.4 12.3  HCT 39.3 36.4  MCV 83.3 83.5  PLT 246 239   Cardiac Enzymes: No results for input(s): CKTOTAL, CKMB, CKMBINDEX, TROPONINI in the last 168 hours. BNP: Invalid  input(s): POCBNP CBG: Recent Labs  Lab 06/25/24 0914 06/25/24 1204 06/25/24 1715 06/25/24 2126 06/26/24 0810  GLUCAP 245* 155* 90 145* 151*   D-Dimer No results for input(s): DDIMER in the last 72 hours. Hgb A1c No results for input(s): HGBA1C in the last 72 hours. Lipid Profile No results for input(s): CHOL, HDL, LDLCALC, TRIG, CHOLHDL, LDLDIRECT in the last 72 hours. Thyroid function studies No results for input(s): TSH, T4TOTAL, T3FREE, THYROIDAB in the last 72 hours.  Invalid input(s): FREET3 Anemia work up No results for input(s): VITAMINB12, FOLATE, FERRITIN, TIBC, IRON, RETICCTPCT in the last 72 hours. Urinalysis    Component Value Date/Time   COLORURINE YELLOW 06/23/2024 0311   APPEARANCEUR CLOUDY (A) 06/23/2024 0311   LABSPEC 1.019 06/23/2024 0311   PHURINE 5.0 06/23/2024 0311   GLUCOSEU NEGATIVE 06/23/2024 0311   HGBUR NEGATIVE 06/23/2024 0311   BILIRUBINUR NEGATIVE 06/23/2024 0311   KETONESUR NEGATIVE 06/23/2024 0311   PROTEINUR NEGATIVE 06/23/2024 0311   NITRITE POSITIVE (A) 06/23/2024 0311   LEUKOCYTESUR NEGATIVE 06/23/2024 0311   Sepsis Labs Recent Labs  Lab 06/23/24 0327 06/24/24 0341  WBC 7.9 6.1   Microbiology Recent Results (from the past 240 hours)  Urine Culture (for pregnant, neutropenic or urologic patients or patients with an indwelling urinary catheter)     Status: Abnormal   Collection Time: 06/23/24  3:11 AM   Specimen: Urine, Clean Catch  Result Value Ref Range Status   Specimen Description URINE, CLEAN CATCH  Final   Special Requests   Final    NONE Performed at [facility], [address] [place], [place] [address]    Culture >=100,000 COLONIES/mL ESCHERICHIA COLI (A)  Final   Report Status 06/25/2024 FINAL  Final   Organism ID, Bacteria ESCHERICHIA COLI (A)  Final      Susceptibility   Escherichia coli - MIC*    AMPICILLIN <=2 SENSITIVE Sensitive     CEFAZOLIN <=4 SENSITIVE  Sensitive     CEFEPIME <=0.12 SENSITIVE Sensitive     CEFTRIAXONE <=0.25 SENSITIVE Sensitive     CIPROFLOXACIN <=0.25 SENSITIVE Sensitive     GENTAMICIN <=1 SENSITIVE Sensitive     IMIPENEM <=0.25 SENSITIVE Sensitive     NITROFURANTOIN <=16 SENSITIVE Sensitive     TRIMETH/SULFA <=20 SENSITIVE Sensitive     AMPICILLIN/SULBACTAM <=2 SENSITIVE Sensitive     PIP/TAZO <=4 SENSITIVE Sensitive ug/mL    * >=100,000 COLONIES/mL ESCHERICHIA COLI     Time coordinating discharge: 35 minutes  SIGNED:   Renato Applebaum, MD  [facility] 06/26/2024, 10:49 AM

## 2024-06-26 NOTE — Progress Notes (Addendum)
 Cornelio Bouchard, MD  Physician Physical Medicine and Rehabilitation   PMR Pre-admission    Signed   Date of Service: 06/26/2024 10:09 AM  Related encounter: ED to Hosp-Admission (Discharged) from 06/23/2024 in [place] [facility]   Signed     Expand All Collapse All  PMR Admission Coordinator Pre-Admission Assessment   Patient: Monica Austin is an 68 y.o., female MRN: [id number] DOB: 01-27-1956 Height: 5' 7 (170.2 cm) Weight: 84 kg   Insurance Information HMO:     PPO: yes     PCP:      IPA:      80/20:      OTHER:  PRIMARY: BCBS Comm PPO       Policy#: [id number]      Subscriber: pt CM Name: April      Phone#: not provided     Fax#: [phone] Pre-Cert#: [id number] auth for CIR provided by April with updates due to Andrea (phone [phone]) at fax listed above on 7/7      Employer:  Benefits:  Phone #:      Name:  Eff. Date: 01/06/24     Deduct: $0      Out of Pocket Max: $5500 ($16.32 met)      Life Max:  CIR: 80%      SNF: 80% Outpatient:      Co-Pay: $50/visit Home Health:       Co-Pay: $50/visit DME: 75%     Co-Pay: 25% Providers:  SECONDARY:  Policy#:      Phone#:    Artist:       Phone#:    The Data processing manager" for patients in Inpatient Rehabilitation Facilities with attached "Privacy Act Statement-Health Care Records" was provided and verbally reviewed with: Patient and Family   Emergency Contact Information Contact Information       Name Relation Home Work Mobile    Okonski,Brandon                 Other Contacts   None on File        Current Medical History  Patient Admitting Diagnosis: CVA    History of Present Illness: Pt is a 68 y/o female with PMH of HTN, HLD, and DM who presented to [facility] on [facility] with L hemiparesis.  Reportedly she was admitted to hospital in [facility] on 6/28 for same, but left against medical advise to return to [facility] for further workup.  Outside hospital MRI revealed a 2.5 cm acute  infarct in the right basal ganglia to the low corona radiata as well as the right frontal operculum.  EF noted to be 60-65% with diastolic dysfunction.  At [facility], she was noted to have BP 156/95, BUN 16, creatinine 1.01, glucose 253, Hgb A1C 9.7, UA consistent with UTI.  NIHSS on admit 7.  Recommendations are for aspirin  and plavix  x90 days, then aspirin  alone.  Hospital course DM management, and abx for UTI.  Therapy evaluations were completed and pt was recommended for CIR.    Complete NIHSS TOTAL: 6   [facility]'s medical record from [facility] has been reviewed by the rehabilitation admission coordinator and physician.   Past Medical History      Past Medical History:  Diagnosis Date   Fatigue     Hyperlipidemia     Hypertension     NIDDM (non-insulin  dependent diabetes mellitus)      Borderline  Has the patient had major surgery during 100 days prior to admission? No   Family History   family history includes COPD in her mother; Cancer in her father; Colon polyps in her sister.   Current Medications  Current Medications    Current Facility-Administered Medications:    acetaminophen  (TYLENOL ) tablet 650 mg, 650 mg, Oral, Q6H PRN, 650 mg at 06/24/24 2211 **OR** acetaminophen  (TYLENOL ) suppository 650 mg, 650 mg, Rectal, Q6H PRN, Claudene, Rondell A, MD   albuterol  (PROVENTIL ) (2.5 MG/3ML) 0.083% nebulizer solution 2.5 mg, 2.5 mg, Nebulization, Q6H PRN, Claudene, Rondell A, MD   aspirin  EC tablet 81 mg, 81 mg, Oral, Daily, Smith, Rondell A, MD, 81 mg at 06/25/24 0955   [START ON 06/26/2024] cephALEXin  (KEFLEX ) capsule 500 mg, 500 mg, Oral, Q8H, Ghimire, Kuber, MD   clopidogrel  (PLAVIX ) tablet 75 mg, 75 mg, Oral, Daily, Smith, Rondell A, MD, 75 mg at 06/25/24 0955   enoxaparin  (LOVENOX ) injection 40 mg, 40 mg, Subcutaneous, Q24H, Smith, Rondell A, MD, 40 mg at 06/25/24 0957   hydrALAZINE  (APRESOLINE ) injection 10 mg, 10 mg, Intravenous, Q4H PRN, Claudene, Rondell A, MD   insulin  aspart  (novoLOG ) injection 0-15 Units, 0-15 Units, Subcutaneous, TID WC, Smith, Rondell A, MD, 5 Units at 06/25/24 0957   insulin  aspart (novoLOG ) injection 0-5 Units, 0-5 Units, Subcutaneous, QHS, Smith, Rondell A, MD, 2 Units at 06/24/24 2152   insulin  glargine-yfgn (SEMGLEE ) injection 12 Units, 12 Units, Subcutaneous, Daily, Ghimire, Kuber, MD, 12 Units at 06/25/24 9043   metFORMIN  (GLUCOPHAGE ) tablet 850 mg, 850 mg, Oral, BID WC, Ghimire, Kuber, MD, 850 mg at 06/25/24 9043   metoprolol  succinate (TOPROL -XL) 24 hr tablet 25 mg, 25 mg, Oral, Daily, Claudene, Rondell A, MD, 25 mg at 06/25/24 9044   montelukast  (SINGULAIR ) tablet 10 mg, 10 mg, Oral, QHS, Smith, Rondell A, MD, 10 mg at 06/24/24 2131   ondansetron  (ZOFRAN ) tablet 4 mg, 4 mg, Oral, Q6H PRN **OR** ondansetron  (ZOFRAN ) injection 4 mg, 4 mg, Intravenous, Q6H PRN, Smith, Rondell A, MD   rosuvastatin  (CRESTOR ) tablet 20 mg, 20 mg, Oral, QPM, Smith, Rondell A, MD, 20 mg at 06/24/24 1859   sodium chloride  flush (NS) 0.9 % injection 3 mL, 3 mL, Intravenous, Q12H, Smith, Rondell A, MD, 3 mL at 06/25/24 9041     Patients Current Diet:  Diet Order                  DIET DYS 3 Room service appropriate? Yes with Assist; Fluid consistency: Thin  Diet effective now                         Precautions / Restrictions Precautions Precautions: Fall Restrictions Weight Bearing Restrictions Per Provider Order: No    Has the patient had 2 or more falls or a fall with injury in the past year? 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 Independent   Indoor Mobility: Did the patient need assistance with walking from room to room (with or without device)? Independent   Stairs: Did the patient need assistance with internal or external stairs (with or without  device)? Independent   Functional Cognition: Did the patient need help planning regular tasks such as shopping or remembering to take medications? Independent   Patient Information Are  you of Hispanic, Latino/a,or Spanish origin?: A. No, not of Hispanic, Latino/a, or Spanish origin What is your race?: B. Black or African American Do you need or want an interpreter to communicate with a doctor or health care staff?: 0. No   Patient's Response To:  Health Literacy and Transportation Is the patient able to respond to health literacy and transportation needs?: Yes Health Literacy - How often do you need to have someone help you when you read instructions, pamphlets, or other written material from your doctor or pharmacy?: Never In the past 12 months, has lack of transportation kept you from medical appointments or from getting medications?: No In the past 12 months, has lack of transportation kept you from meetings, work, or from getting things needed for daily living?: No   Home Assistive Devices / Equipment Home Equipment: Grab bars - tub/shower, Hand held shower head   Prior Device Use: Indicate devices/aids used by the patient prior to current illness, exacerbation or injury? None of the above   Current Functional Level Cognition   Arousal/Alertness: Awake/alert Overall Cognitive Status: Impaired/Different from baseline Orientation Level: Oriented X4 Attention: Sustained Sustained Attention: Appears intact Memory: Impaired Memory Impairment: Decreased recall of new information Awareness: Impaired Awareness Impairment: Intellectual impairment, Other (comment) (online awareness) Problem Solving: Impaired Problem Solving Impairment: Verbal basic Safety/Judgment: Impaired    Extremity Assessment (includes Sensation/Coordination)   Upper Extremity Assessment: Right hand dominant LUE Deficits / Details: decr coordination speed as well as generally weak. 4-/5. poor initiation as  compared to R. Decr attention to L side at times. LUE Sensation: decreased light touch, decreased proprioception LUE Coordination: decreased fine motor, decreased gross motor  Lower Extremity Assessment: LLE deficits/detail LLE Deficits / Details: grossly 3/5     ADLs   Overall ADL's : Needs assistance/impaired Eating/Feeding: Set up, Sitting Eating/Feeding Details (indicate cue type and reason): uses RUE to feed self, requires setup Grooming: Wash/dry hands, Wash/dry face, Contact guard assist, Standing Grooming Details (indicate cue type and reason): at sink Upper Body Bathing: Supervision/ safety, Sitting, Cueing for sequencing Upper Body Bathing Details (indicate cue type and reason): in shower Lower Body Bathing: Minimal assistance, Sit to/from stand, Cueing for sequencing Upper Body Dressing : Minimal assistance, Sitting Upper Body Dressing Details (indicate cue type and reason): to donn pullover top Lower Body Dressing: Minimal assistance, Sit to/from stand Lower Body Dressing Details (indicate cue type and reason): patient donned pullup briefs and socks with min assist Toilet Transfer: Minimal assistance, Ambulation, Rolling walker (2 wheels) Toilet Transfer Details (indicate cue type and reason): CGA progressing to min A Tub/ Shower Transfer: Walk-in shower, Minimal assistance, Shower seat, Engineer, technical sales Details (indicate cue type and reason): cues for safety and grab bars use Functional mobility during ADLs: Minimal assistance, Contact guard assist, Rollator (4 wheels) General ADL Comments: required cues for safety and sequencing. Son present and encouraged to allow patient to perform tasks     Mobility   Overal bed mobility: Needs Assistance Bed Mobility: Supine to Sit Supine to sit: Contact guard Sit to supine: Supervision General bed mobility comments: CGA with trunk     Transfers   Overall transfer level: Needs assistance Equipment used: Rollator (4  wheels) Transfers: Sit to/from Stand Sit to Stand: Min assist General transfer comment: min assist to power up and min to CGA for transfers with cue for brake use and safety     Ambulation / Gait / Stairs / Wheelchair Mobility   Ambulation/Gait Ambulation/Gait assistance:  Min assist, +2 safety/equipment Gait Distance (Feet): 75 Feet (75 feet x 2) Assistive device: Rollator (4 wheels) Gait Pattern/deviations: Step-through pattern, Decreased step length - left, Decreased stride length, Decreased stance time - left, Decreased dorsiflexion - left, Drifts right/left General Gait Details: Pt was able to ambulate with rollator needing cues to sequence steps and rollator. Pt at times needed cues to step left LE as she shortens step length.  Appears to have decr attention to left side with poor awareness of where left UE was as well as not tracking fully to left without cuing. Pt also impulsive and walking too quickly intiialy.  Pt did walk to the bathroom and tried to urinate but couldnt go. Gait velocity interpretation: <1.8 ft/sec, indicate of risk for recurrent falls     Posture / Balance Dynamic Sitting Balance Sitting balance - Comments: statically Balance Overall balance assessment: Needs assistance Sitting-balance support: No upper extremity supported, Feet supported Sitting balance-Leahy Scale: Good Sitting balance - Comments: statically Standing balance support: Single extremity supported, Bilateral upper extremity supported, No upper extremity supported, During functional activity Standing balance-Leahy Scale: Poor Standing balance comment: CGA to stand with one to two extremity support, min assist with no UE support during self care tasks     Special needs/care consideration Diabetic management yes    Previous Home Environment (from acute therapy documentation) Living Arrangements: Other relatives  Lives With: Family Available Help at Discharge: Family, Available 24 hours/day Type  of Home: House (has a house in [place], which her grandson also stays at, but she also goes to [place] for periods of time to work and she stays with her daughter while up there) Home Layout: One level, Laundry or work area in basement Home Access: Stairs to enter Entrance Stairs-Rails: Right, Left Entrance Stairs-Number of Steps: 6-7 Bathroom Shower/Tub: Engineer, manufacturing systems: Standard (comfort) Home Care Services: No   Discharge Living Setting Plans for Discharge Living Setting: Patient's home (daughter, Delaine to stay with her as well as grandson) Type of Home at Discharge: House Discharge Home Layout: One level Discharge Home Access: Stairs to enter Entrance Stairs-Rails: Right, Left Entrance Stairs-Number of Steps: 6-7 Discharge Bathroom Shower/Tub: Tub/shower unit Discharge Bathroom Toilet: Handicapped height Discharge Bathroom Accessibility: Yes How Accessible: Accessible via walker Does the patient have any problems obtaining your medications?: No   Social/Family/Support Systems Anticipated Caregiver: daughter, Delaine [phone] Anticipated Caregiver's Contact Information: see above Ability/Limitations of Caregiver: none stated, she is taking FMLA to provide assist for mom Caregiver Availability: 24/7 Discharge Plan Discussed with Primary Caregiver: Yes Is Caregiver In Agreement with Plan?: Yes Does Caregiver/Family have Issues with Lodging/Transportation while Pt is in Rehab?: No   Goals Patient/Family Goal for Rehab: PT/OT/SLP supervision to mod I Expected length of stay: 10-14 days Additional Information: Discharge plan: pt to discharge to her home at supervision/mod I level, daughter Delaine planning to come to stay with her at discharge and can provide 24/7 (she will take FMLA) Pt/Family Agrees to Admission and willing to participate: Yes Program Orientation Provided & Reviewed with Pt/Caregiver Including Roles  & Responsibilities: Yes   Decrease  burden of Care through IP rehab admission: n/a   Possible need for SNF placement upon discharge: No.  Plan for discharge to pt's home at supervision to mod I level.  Daughter to come from [place] and take FMLA to stay with pt.     Patient Condition: I have reviewed medical records from [facility], spoken with CM, and patient and daughter. I  met with patient at the bedside and discussed via phone for inpatient rehabilitation assessment.  Patient will benefit from ongoing PT, OT, and SLP, can actively participate in 3 hours of therapy a day 5 days of the week, and can make measurable gains during the admission.  Patient will also benefit from the coordinated team approach during an Inpatient Acute Rehabilitation admission.  The patient will receive intensive therapy as well as Rehabilitation physician, nursing, social worker, and care management interventions.  Due to safety, skin/wound care, disease management, medication administration, pain management, and patient education the patient requires 24 hour a day rehabilitation nursing.  The patient is currently min assist with mobility and basic ADLs.  Discharge setting and therapy post discharge at home with home health is anticipated.  Patient has agreed to participate in the Acute Inpatient Rehabilitation Program and will admit today.   Preadmission Screen Completed By:  Reche FORBES Lowers, PT, DPT 06/25/2024 11:59 AM ______________________________________________________________________   Discussed status with Dr. Lovorn on 06/26/24  at 10:09 AM and received approval for admission today.   Admission Coordinator:  Sakeena Teall E Corrie Brannen, PT, DPT time 10:09 AM Pattricia 06/26/24     Assessment/Plan: Diagnosis: R BG stroke Does the need for close, 24 hr/day Medical supervision in concert with the patient's rehab needs make it unreasonable for this patient to be served in a less intensive setting? Yes Co-Morbidities requiring supervision/potential complications: HTN, HLD,  DM uncontrolled A1c 9.7 Due to bladder management, bowel management, safety, skin/wound care, disease management, medication administration, pain management, and patient education, does the patient require 24 hr/day rehab nursing? Yes Does the patient require coordinated care of a physician, rehab nurse, PT, OT, and SLP to address physical and functional deficits in the context of the above medical diagnosis(es)? Yes Addressing deficits in the following areas: balance, endurance, locomotion, strength, transferring, bowel/bladder control, bathing, dressing, feeding, grooming, toileting, and swallowing Can the patient actively participate in an intensive therapy program of at least 3 hrs of therapy 5 days a week? Yes The potential for patient to make measurable gains while on inpatient rehab is good Anticipated functional outcomes upon discharge from inpatient rehab: modified independent and supervision PT, modified independent and supervision OT, modified independent and supervision SLP Estimated rehab length of stay to reach the above functional goals is: 10-14 days Anticipated discharge destination: Home 10. Overall Rehab/Functional Prognosis: good     MD Signature:            Revision History

## 2024-06-26 NOTE — Progress Notes (Signed)
 Inpatient Rehabilitation Admission Medication Review by a Pharmacist  A complete drug regimen review was completed for this patient to identify any potential clinically significant medication issues.  High Risk Drug Classes Is patient taking? Indication by Medication  Antipsychotic Yes, as an intravenous medication Compazine- n/v   Anticoagulant Yes Lovenox- VTE ppx   Antibiotic Yes Keflex x 11 doses- UTI   Opioid No   Antiplatelet Yes bASA, clopidogrel (DAPT for 3 months followed by bASA alone)- CVA   Hypoglycemics/insulin Yes Insulin, metformin- DM   Vasoactive Medication Yes Toprol- HTN   Chemotherapy No   Other No fleet enema , bisacodyl , and - constipation Maalox- indigestion Diphenhydramine- itching  Acetaminophen- pain  Robitussin- cough   melatonin and -insomnia Singulair- asthma      Type of Medication Issue Identified Description of Issue Recommendation(s)  Drug Interaction(s) (clinically significant)     Duplicate Therapy     Allergy     No Medication Administration End Date     Incorrect Dose     Additional Drug Therapy Needed     Significant med changes from prior encounter (inform family/care partners about these prior to discharge). PTA meds not resumed- calcium carbonate   Dose changes- Crestor  Communicate relevant medication changes to patient/family members at discharge from CIR.   Restart or discontinue PTA meds not resumed in CIR at discharge if clinically indicated.   Other       Clinically significant medication issues were identified that warrant physician communication and completion of prescribed/recommended actions by midnight of the next day:  No   Time spent performing this drug regimen review (minutes):  30  Massie Fila, PharmD Clinical Pharmacist  06/26/2024 1:04 PM

## 2024-06-27 DIAGNOSIS — I63511 Cerebral infarction due to unspecified occlusion or stenosis of right middle cerebral artery: Secondary | ICD-10-CM | POA: Diagnosis not present

## 2024-06-27 LAB — CBC WITH DIFFERENTIAL/PLATELET
Abs Immature Granulocytes: 0.01 K/uL (ref 0.00–0.07)
Basophils Absolute: 0 K/uL (ref 0.0–0.1)
Basophils Relative: 0 %
Eosinophils Absolute: 0.1 K/uL (ref 0.0–0.5)
Eosinophils Relative: 1 %
HCT: 36.4 % (ref 36.0–46.0)
Hemoglobin: 12.2 g/dL (ref 12.0–15.0)
Immature Granulocytes: 0 %
Lymphocytes Relative: 39 %
Lymphs Abs: 2.8 K/uL (ref 0.7–4.0)
MCH: 27.7 pg (ref 26.0–34.0)
MCHC: 33.5 g/dL (ref 30.0–36.0)
MCV: 82.7 fL (ref 80.0–100.0)
Monocytes Absolute: 0.5 K/uL (ref 0.1–1.0)
Monocytes Relative: 7 %
Neutro Abs: 3.9 K/uL (ref 1.7–7.7)
Neutrophils Relative %: 53 %
Platelets: 242 K/uL (ref 150–400)
RBC: 4.4 MIL/uL (ref 3.87–5.11)
RDW: 13.4 % (ref 11.5–15.5)
WBC: 7.4 K/uL (ref 4.0–10.5)
nRBC: 0 % (ref 0.0–0.2)

## 2024-06-27 LAB — COMPREHENSIVE METABOLIC PANEL WITH GFR
ALT: 23 U/L (ref 0–44)
AST: 24 U/L (ref 15–41)
Albumin: 3.5 g/dL (ref 3.5–5.0)
Alkaline Phosphatase: 60 U/L (ref 38–126)
Anion gap: 7 (ref 5–15)
BUN: 15 mg/dL (ref 8–23)
CO2: 24 mmol/L (ref 22–32)
Calcium: 9.1 mg/dL (ref 8.9–10.3)
Chloride: 106 mmol/L (ref 98–111)
Creatinine, Ser: 0.86 mg/dL (ref 0.44–1.00)
GFR, Estimated: >60 mL/min (ref >60)
Glucose, Bld: 152 mg/dL — ABNORMAL HIGH (ref 70–99)
Potassium: 3.8 mmol/L (ref 3.5–5.1)
Sodium: 137 mmol/L (ref 135–145)
Total Bilirubin: 0.7 mg/dL (ref 0.0–1.2)
Total Protein: 7 g/dL (ref 6.5–8.1)

## 2024-06-27 LAB — MAGNESIUM: Magnesium: 2.3 mg/dL (ref 1.7–2.4)

## 2024-06-27 LAB — VITAMIN D 25 HYDROXY (VIT D DEFICIENCY, FRACTURES): Vit D, 25-Hydroxy: 31.26 ng/mL (ref 30–100)

## 2024-06-27 LAB — GLUCOSE, CAPILLARY
Glucose-Capillary: 164 mg/dL — ABNORMAL HIGH (ref 70–99)
Glucose-Capillary: 130 mg/dL — ABNORMAL HIGH (ref 70–99)
Glucose-Capillary: 105 mg/dL — ABNORMAL HIGH (ref 70–99)
Glucose-Capillary: 109 mg/dL — ABNORMAL HIGH (ref 70–99)

## 2024-06-27 MED ORDER — METOPROLOL SUCCINATE ER 25 MG PO TB24
12.5000 mg | ORAL_TABLET | Freq: Every day | ORAL | Status: DC
  Administered 2024-06-28 – 2024-07-08 (×10): 12.5 mg via ORAL
  Filled 2024-06-27 (×11): qty 1

## 2024-06-27 NOTE — Plan of Care (Signed)
  Problem: RH Balance Goal: LTG Patient will maintain dynamic standing balance (PT) Description: LTG:  Patient will maintain dynamic standing balance with assistance during mobility activities (PT) Flowsheets (Taken 06/27/2024 1502) LTG: Pt will maintain dynamic standing balance during mobility activities with:: Independent with assistive device    Problem: Sit to Stand Goal: LTG:  Patient will perform sit to stand with assistance level (PT) Description: LTG:  Patient will perform sit to stand with assistance level (PT) Flowsheets (Taken 06/27/2024 1502) LTG: PT will perform sit to stand in preparation for functional mobility with assistance level: Independent with assistive device   Problem: RH Bed Mobility Goal: LTG Patient will perform bed mobility with assist (PT) Description: LTG: Patient will perform bed mobility with assistance, with/without cues (PT). Flowsheets (Taken 06/27/2024 1502) LTG: Pt will perform bed mobility with assistance level of: Independent with assistive device    Problem: RH Bed to Chair Transfers Goal: LTG Patient will perform bed/chair transfers w/assist (PT) Description: LTG: Patient will perform bed to chair transfers with assistance (PT). Flowsheets (Taken 06/27/2024 1502) LTG: Pt will perform Bed to Chair Transfers with assistance level: Independent with assistive device    Problem: RH Car Transfers Goal: LTG Patient will perform car transfers with assist (PT) Description: LTG: Patient will perform car transfers with assistance (PT). Flowsheets (Taken 06/27/2024 1502) LTG: Pt will perform car transfers with assist:: Set up assist    Problem: RH Ambulation Goal: LTG Patient will ambulate in controlled environment (PT) Description: LTG: Patient will ambulate in a controlled environment, # of feet with assistance (PT). Flowsheets (Taken 06/27/2024 1502) LTG: Pt will ambulate in controlled environ  assist needed:: Independent with assistive device LTG: Ambulation  distance in controlled environment: 150 feet with LRAD Goal: LTG Patient will ambulate in home environment (PT) Description: LTG: Patient will ambulate in home environment, # of feet with assistance (PT). Flowsheets (Taken 06/27/2024 1502) LTG: Pt will ambulate in home environ  assist needed:: Independent with assistive device LTG: Ambulation distance in home environment: 75 feet with LRAD Goal: LTG Patient will ambulate in community environment (PT) Description: LTG: Patient will ambulate in community environment, # of feet with assistance (PT). Flowsheets (Taken 06/27/2024 1502) LTG: Pt will ambulate in community environ  assist needed:: Supervision/Verbal cueing LTG: Ambulation distance in community environment: 50 feet with LRAD   Problem: RH Stairs Goal: LTG Patient will ambulate up and down stairs w/assist (PT) Description: LTG: Patient will ambulate up and down # of stairs with assistance (PT) Flowsheets (Taken 06/27/2024 1502) LTG: Pt will ambulate up/down stairs assist needed:: Supervision/Verbal cueing LTG: Pt will  ambulate up and down number of stairs: 12 steps with LRAD

## 2024-06-27 NOTE — Progress Notes (Signed)
 Inpatient Rehabilitation Care Coordinator Assessment and Plan Patient Details  Name: WHITLEIGH GARRAMONE MRN: [id number] Date of Birth: 1956-03-06  Today's Date: 06/27/2024  Hospital Problems: Principal Problem:   Acute ischemic right MCA stroke [facility])  Past Medical History:  Past Medical History:  Diagnosis Date   Diverticulosis    Fatigue    Hyperlipidemia    Hypertension    NIDDM (non-insulin  dependent diabetes mellitus)    Borderline   Past Surgical History:  Past Surgical History:  Procedure Laterality Date   BREAST BIOPSY     BTL     COLONOSCOPY  02/17/2020   HERNIA REPAIR     OVARIAN CYST REMOVAL     Social History:  reports that she has never smoked. She has never used smokeless tobacco. She reports that she does not currently use alcohol. She reports that she does not use drugs.  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 Both son and grandson work Engineer, structural Availability: 24/7 (for short time) Family Dynamics: Close with family was on vacation wiht them when this occurred.  They drove her back here and directly to the ER. Pt is very independent and still working prior to this  Social History Preferred language: English Religion: Non-Denominational Cultural Background: NA Education: Charity fundraiser - How often do you need to have someone help you when you read instructions, pamphlets, or other written material from your doctor or pharmacy?: Never Writes: Yes Employment Status: Employed Name of Employer: [employer] Return to Work Plans: Unsre at this time, will depend upon her progress and recovery Marine scientist Issues: NA Guardian/Conservator: None-according to MD pt is capable  of making her own decisions while here. Will include children due to speech/language issues   Abuse/Neglect Abuse/Neglect Assessment Can Be Completed: Yes Physical Abuse: Denies Verbal Abuse: Denies Sexual Abuse: Denies Exploitation of patient/patient's resources: Denies Self-Neglect: Denies  Patient response to: Social Isolation - How often do you feel lonely or isolated from those around you?: Never  Emotional Status Pt's affect, behavior and adjustment status: Pt is motivated to do well and recover from this stroke. She was very independent prior to this still working full time and taking care of herself. She would go between here and [place]  have homes in both places. Recent Psychosocial Issues: other health issues Psychiatric History: No history may benefit from seeing neuro-psych while here for coping due to how independent she was prior to this happening Substance Abuse History: NA  Patient / Family Perceptions, Expectations & Goals Pt/Family understanding of illness & functional limitations: Pt can explain her stroke and deficits, she hopes to do well here. She is not one to ask for assist. She does talk with the MD's rounding and feels understands her plan moving forward. Premorbid pt/family roles/activities: mom, grandmother, employee, home owner, etc Anticipated changes in roles/activities/participation: resume Pt/family expectations/goals: Pt states:  I hope to do well and not need help.   My daughter has other plans than I do.  Community [employer] Agencies: None Premorbid Home Care/DME Agencies: Other (Comment) (grab bars in shower) Transportation available at discharge: self and family Is the patient able to respond to transportation needs?: Yes In the past 12 months, has lack of transportation kept you from medical appointments or from getting medications?: No In the past 12 months, has lack  of transportation kept you from meetings, work, or from getting  things needed for daily living?: No Resource referrals recommended: Neuropsychology  Discharge Planning Living Arrangements: Other relatives Support Systems: Children, Other relatives, Friends/neighbors Type of Residence: Private residence Insurance Resources: Media planner (specify) Herbalist) Financial Resources: Employment, Restaurant manager, fast food Screen Referred: No Living Expenses: Own Money Management: Patient Does the patient have any problems obtaining your medications?: No Home Management: self Patient/Family Preliminary Plans: Return home with daughter coming down from [place]  to assist via FMLA. Her grandson does live with her but does work. She has homes in [place] and [place]  she would go back and forth. Care Coordinator Barriers to Discharge: Insurance for SNF coverage Care Coordinator Anticipated Follow Up Needs: HH/OP  Clinical Impression Pleasant somewhat quiet female, she is motivated to do well and recover. She is not one to ask for assist from others. Her daughter plans to come from [place]  to stay with her at discharge. Son and grandson do live here but work. Will await team's evaluations and work on discharge needs. Her PCP is in [place]   Erika Hussar, [place] MATSU 06/27/2024, 9:58 AM

## 2024-06-27 NOTE — Plan of Care (Signed)
  Problem: RH Swallowing Goal: LTG Patient will consume least restrictive diet using compensatory strategies with assistance (SLP) Description: LTG:  Patient will consume least restrictive diet using compensatory strategies with assistance (SLP) Flowsheets (Taken 06/27/2024 1156) LTG: Pt Patient will consume least restrictive diet using compensatory strategies with assistance of (SLP): Supervision   Problem: RH Expression Communication Goal: LTG Patient will increase speech intelligibility (SLP) Description: LTG: Patient will increase speech intelligibility at word/phrase/conversation level with cues, % of the time (SLP) Flowsheets (Taken 06/27/2024 1156) LTG: Patient will increase speech intelligibility (SLP): Supervision Level: Conversation level Percent of time patient will use intelligible speech: 80   Problem: RH Problem Solving Goal: LTG Patient will demonstrate problem solving for (SLP) Description: LTG:  Patient will demonstrate problem solving for basic/complex daily situations with cues  (SLP) Flowsheets (Taken 06/27/2024 1156) LTG: Patient will demonstrate problem solving for (SLP): (mildly complex situations) Other (comment) LTG Patient will demonstrate problem solving for: Supervision   Problem: RH Attention Goal: LTG Patient will demonstrate this level of attention during functional activites (SLP) Description: LTG:  Patient will will demonstrate this level of attention during functional activites (SLP) Flowsheets (Taken 06/27/2024 1156) Patient will demonstrate during cognitive/linguistic activities the attention type of: Sustained Patient will demonstrate this level of attention during cognitive/linguistic activities in: Controlled LTG: Patient will demonstrate this level of attention during cognitive/linguistic activities with assistance of (SLP): Supervision Number of minutes patient will demonstrate attention during cognitive/linguistic activities: 20   Problem: RH  Awareness Goal: LTG: Patient will demonstrate awareness during functional activites type of (SLP) Description: LTG: Patient will demonstrate awareness during functional activites type of (SLP) Flowsheets (Taken 06/27/2024 1156) Patient will demonstrate during cognitive/linguistic activities awareness type of: Intellectual LTG: Patient will demonstrate awareness during cognitive/linguistic activities with assistance of (SLP): Supervision

## 2024-06-27 NOTE — Progress Notes (Signed)
 PROGRESS NOTE   Subjective/Complaints: No new complaints this morning Appreciate LPN Douglas's message regarding bradycardia and hypotension, morning lopressor  dose decreased  ROS: +nausea yesterday  Objective:   No results found. Recent Labs    06/27/24 0440  WBC 7.4  HGB 12.2  HCT 36.4  PLT 242   Recent Labs    06/27/24 0440  NA 137  K 3.8  CL 106  CO2 24  GLUCOSE 152*  BUN 15  CREATININE 0.86  CALCIUM  9.1    Intake/Output Summary (Last 24 hours) at 06/27/2024 0912 Last data filed at 06/27/2024 0800 Gross per 24 hour  Intake 354 ml  Output --  Net 354 ml        Physical Exam: Vital Signs Blood pressure 131/84, pulse 61, temperature 98 F (36.7 C), resp. rate 18, height 5' 7 (1.702 m), weight (S) 87.4 kg, SpO2 96%. Gen: no distress, normal appearing HEENT: oral mucosa pink and moist, NCAT Cardio: Reg rate Chest: normal effort, normal rate of breathing Abd: soft, non-distended Ext: no edema Psych: pleasant, normal affect Skin: intact Musculoskeletal:     Cervical back: Neck supple. No tenderness.     Comments: RUE 5/5 thorughout LUE- ~4/5 ina ll muscles except FA which is 2+/5 RLE- 5/5 throughout LLE- 4+ throughout   Skin:    General: Skin is warm and dry.     Comments: No wounds seen  Neurological:     Mental Status: She is alert.     Comments: Left facial weakness with mild dysarthria. Able to answer orientation questions without difficulty and follow simple one and two step commands. Left sided weakness.  Knows in [facility] July [facility]rd 20[facility] and why here, however slowed /delayed responses- also knows next holiday is 4th of July- tomorrow.  Intact to light touch in all 4 extremities   Psychiatric:     Comments: Extremely flat    Assessment/Plan: 1. Functional deficits which require 3+ hours per day of interdisciplinary therapy in a comprehensive inpatient rehab setting. Physiatrist  is providing close team supervision and 24 hour management of active medical problems listed below. Physiatrist and rehab team continue to assess barriers to discharge/monitor patient progress toward functional and medical goals  Care Tool:  Bathing              Bathing assist       Upper Body Dressing/Undressing Upper body dressing        Upper body assist      Lower Body Dressing/Undressing Lower body dressing            Lower body assist       Toileting Toileting    Toileting assist       Transfers Chair/bed transfer  Transfers assist           Locomotion Ambulation   Ambulation assist              Walk 10 feet activity   Assist           Walk 50 feet activity   Assist           Walk 150 feet activity   Assist  Walk 10 feet on uneven surface  activity   Assist           Wheelchair     Assist               Wheelchair 50 feet with 2 turns activity    Assist            Wheelchair 150 feet activity     Assist          Blood pressure 131/84, pulse 61, temperature 98 F (36.7 C), resp. rate 18, height 5' 7 (1.702 m), weight (S) 87.4 kg, SpO2 96%.  Medical Problem List and Plan: 1. Functional deficits secondary to R BG stroke/R MCA stroke             -patient may  shower             -ELOS/Goals: 10-14 days- supervision to mod I- needs PT, OT and SLP             Grounds pass ordered  2.  Antithrombotics: -DVT/anticoagulation:  Pharmaceutical: continue Lovenox              -antiplatelet therapy: DAPT X 3 months followed by ASA  3. Pain Management: tylenol  prn.  4. Mood/Behavior/Sleep: LCSW to follow for evaluation and support.              -antipsychotic agents: N/A 5. Neuropsych/cognition: This patient is capable of making decisions on her own behalf. 6. Skin/Wound Care: Routine pressure relief measures.  7. Fluids/Electrolytes/Nutrition: Monitor I/O. Check CMET in  am.  8. E coli UTI: Keflex  antibiotic D#3/7 9. Hypotension: decrease Toprol  XL to 12.5mg  daily. Avoid hypotension with evidence of intracranial stenosis.            10.  T2DM: Hgb A1c- 9.7. Monitor BS ac/hs and use SSI for elevated BS. Add CM restrictions to diet.             --Was on metformin  PTA with poor control.  --now on Insulin  glargline. Increase metformin  to 1000 mg bid/decrease SSI to sensitive.               --RD consulted for education on carb modified diet and Rehab RN for insulin  education.   -provide list of foods for diabetes  -CBGs reviewed  11. Dyslipidemia: continue Crestor .   12.  Mild intermittent asthma: continue Singulair   13. Thyroid nodules/enlargement: Ultrasound for work up after discharge.     LOS: 1 days A FACE TO FACE EVALUATION WAS PERFORMED  Trinidad Ingle P Maryanna Stuber 06/27/2024, 9:12 AM

## 2024-06-27 NOTE — Progress Notes (Signed)
 Similar to events evening prior. After evening medication administration demonstrates signs and symptoms of perspiration, feeling warm, and nauseous. Reports sensation of wanting to vomit.

## 2024-06-27 NOTE — Evaluation (Addendum)
 Physical Therapy Assessment and Plan  Patient Details  Name: Monica Austin MRN: [id number] Date of Birth: 1956-10-14  PT Diagnosis: Abnormal posture, Abnormality of gait, Cognitive deficits, Difficulty walking, Impaired cognition, and Muscle weakness Rehab Potential: Good ELOS: 10-14 days   Today's Date: 06/27/2024 PT Individual Time: 1300-1413 PT Individual Time Calculation (min): 73 min    Hospital Problem: Principal Problem:   Acute ischemic right MCA stroke [facility])   Past Medical History:  Past Medical History:  Diagnosis Date   Diverticulosis    Fatigue    Hyperlipidemia    Hypertension    NIDDM (non-insulin  dependent diabetes mellitus)    Borderline   Past Surgical History:  Past Surgical History:  Procedure Laterality Date   BREAST BIOPSY     BTL     COLONOSCOPY  02/17/2020   HERNIA REPAIR     OVARIAN CYST REMOVAL      Assessment & Plan Clinical Impression: Patient is a 68 y.o. year old female with history of HTN, T2DM- A1c 9.7 (per pt usually ~6.5-7.0), hyperlipidemia, medication non-compliance; who was admitted to hospital while vacationing in [place]  on 06/21/24 with elevated BP- 216/66, left sided weakness, slurred speech and dysphagia. LSN the night before she went to bed and did not receive thrombolysis. She was found to have right basal ganglia infarct with moderate to severe narrowing B-PCA, moderate to severe narrowing R-VA, severe focal narrowing mid portion of L-VA, severe focal narrowing M2 L-MCA,  distal M1 R-MCA as well as incidental findings of diffuse enlargement of thyroid gland with multiple small hypodense nodules and multilevel cervical spondylosis. DAPT recommended. She left AMA, was noted to have issues with aspiration enroute to [facility] and presented to [facility] ED for evaluation and transferred to [facility] for management. Dr. Jerrie recommended 90 day duration of DAPT followed by ASA alone. She was started on rocephin  as found to have E coli UTI and transitioned  to Keflex  to complete 7 day course of treatment. T2DM poorly controlled with A1C-9.7 and insulin  recommended by diabetes coordinator.    Speech therapy evaluation showed oropharyngeal dysphagia with impaired mastication and penetration per MBS. Current recommendations are -D3, thin liquids recommended and if coughing persists try chin tuck (reduce amount of penetration) and remove straws. 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 She spends times between [facility] and [place]-->Lives with grandson and works for [employer] in [place]. She does not have local MD. She was independent PTA and CIR recommended due to functional decline.    Patient currently requires min A with mobility secondary to muscle weakness and muscle paralysis, decreased cardiorespiratoy endurance, impaired timing and sequencing, decreased coordination, and decreased motor planning, decreased attention to left, decreased attention, decreased awareness, decreased problem solving, decreased safety awareness, and decreased memory, and decreased standing balance, hemiplegia, and decreased balance strategies.  Prior to hospitalization, patient was independent  with mobility and lived with Family (lives with her grandson - IT sales professional (16)) in a House home.  Home access is 7 (7 steps to front entrance of house- B HR)Stairs to enter.  Patient will benefit from skilled PT intervention to maximize safe functional mobility, minimize fall risk, and decrease caregiver burden for planned discharge home with intermittent assist/supervision  Anticipate patient will benefit from follow up OP at discharge.  PT - End of Session Activity Tolerance: Tolerates 30+ min  activity with multiple rests Endurance Deficit: Yes PT Assessment Rehab Potential (ACUTE/IP ONLY): Good PT  Barriers to Discharge: Inaccessible home environment;Decreased caregiver support;Home environment access/layout;Incontinence PT Patient demonstrates impairments in the following area(s): Balance;Edema;Endurance;Motor;Perception;Safety;Sensory PT Transfers Functional Problem(s): Bed Mobility;Bed to Chair;Car PT Locomotion Functional Problem(s): Ambulation;Stairs PT Plan PT Intensity: Minimum of 1-2 x/day ,45 to 90 minutes PT Frequency: 5 out of 7 days PT Duration Estimated Length of Stay: 10-14 days PT Treatment/Interventions: Ambulation/gait training;Community reintegration;DME/adaptive equipment instruction;Neuromuscular re-education;Psychosocial support;Stair training;UE/LE Strength taining/ROM;Wheelchair propulsion/positioning;Balance/vestibular training;Discharge planning;Functional electrical stimulation;Pain management;Skin care/wound management;Therapeutic Activities;UE/LE Coordination activities;Cognitive remediation/compensation;Disease management/prevention;Functional mobility training;Patient/family education;Splinting/orthotics;Therapeutic Exercise;Visual/perceptual remediation/compensation PT Transfers Anticipated Outcome(s): mod I PT Locomotion Anticipated Outcome(s): mod I/supervision PT Recommendation Recommendations for Other Services: Speech consult;Neuropsych consult;Therapeutic Recreation consult Follow Up Recommendations: Outpatient PT Patient destination: Home Equipment Recommended: To be determined   PT Evaluation Precautions/Restrictions Precautions Precautions: Fall Precaution/Restrictions Comments: L hemiparesis Restrictions Weight Bearing Restrictions Per Provider Order: No Pain Interference Pain Interference Pain Effect on Sleep: 2. Occasionally (R knee pain) Pain Interference with Therapy Activities: 1. Rarely or not at all Pain Interference with Day-to-Day Activities: 1. Rarely or not at all Home Living/Prior Functioning Home Living Available Help at  Discharge: Family;Available 24 hours/day Type of Home: House Home Access: Stairs to enter [employer] of Steps: 7 (7 steps to front entrance of house- B HR) Entrance Stairs-Rails: Can reach both Home Layout: One level;Laundry or work area in basement;Other (Comment) Bathroom Shower/Tub: Armed forces operational officer Accessibility: No (pt son reports bathroom is very narrow) Additional Comments: pt bedroom is in basement, with partial kitchen and bathroom. Level entry to basement, however 7 steps to main house, pt has flight of steps to access main floor  Lives With: Family (lives with her grandson - IT sales professional (23)) Prior Function Level of Independence: Independent with basic ADLs  Able to Take Stairs?: Yes Driving: Yes Vocation: Full time employment Vision/Perception  Vision - History Ability to See in Adequate Light: 0 Adequate Perception Perception: Impaired Preception Impairment Details: Inattention/Neglect Praxis Praxis: Impaired Praxis Impairment Details: Motor planning  Cognition Overall Cognitive Status: Impaired/Different from baseline Arousal/Alertness: Awake/alert Orientation Level: Oriented X4 Attention: Sustained;Selective Sustained Attention: Impaired Sustained Attention Impairment: Verbal basic;Functional basic Selective Attention: Impaired Selective Attention Impairment: Verbal basic;Functional basic Memory: Impaired Memory Impairment: Decreased recall of new information Awareness: Impaired Awareness Impairment: Intellectual impairment;Emergent impairment;Anticipatory impairment Problem Solving: Impaired Problem Solving Impairment: Verbal basic;Functional basic Executive Function: Reasoning;Organizing;Sequencing Reasoning: Impaired Reasoning Impairment: Functional basic;Verbal basic Sequencing: Impaired Sequencing Impairment: Functional basic;Verbal basic Organizing: Impaired Organizing Impairment: Functional basic;Verbal  basic Safety/Judgment: Impaired Sensation Sensation Light Touch: Impaired by gross assessment Additional Comments: pt able to detect light touch in all dermatomes in B LE. pt deneis any N&T. Coordination Gross Motor Movements are Fluid and Coordinated: No Fine Motor Movements are Fluid and Coordinated: No Coordination and Movement Description: decreased speed and not fluid Finger Nose Finger Test: slow with accuracy Motor  Motor Motor: Abnormal postural alignment and control;Hemiplegia  Trunk/Postural Assessment  Cervical Assessment Cervical Assessment: Exceptions to [facility] (forward) Thoracic Assessment Thoracic Assessment: Exceptions to [facility] (kyphosis) Lumbar Assessment Lumbar Assessment: Within Functional Limits Postural Control Postural Control: Deficits on evaluation Righting Reactions: delayed Protective Responses: delayed  Balance Balance Balance Assessed: Yes Static Sitting Balance Static Sitting - Balance Support: Bilateral upper extremity supported Static Sitting - Level of Assistance: 5: Stand by assistance (supervision) Dynamic Sitting Balance Dynamic Sitting - Level of Assistance: 5: Stand by assistance (CGA) Static Standing Balance Static Standing - Level  of Assistance: 5: Stand by assistance;4: Min assist Dynamic Standing Balance Dynamic Standing - Balance Support: During functional activity Dynamic Standing - Level of Assistance: 4: Min assist Extremity Assessment  RUE Assessment RUE Assessment: Within Functional Limits Active Range of Motion (AROM) Comments: WFL General Strength Comments: 4-5/5 LUE Assessment LUE Assessment: Exceptions to [facility] Active Range of Motion (AROM) Comments: AROM WFL- slight decrease from R side. AROM 160 General Strength Comments: 3+ to 4-/5 RLE Assessment RLE Assessment: Within Functional Limits LLE Assessment LLE Assessment: Exceptions to [facility] General Strength Comments: grossly 4/5  Care Tool Care Tool Bed Mobility Roll left  and right activity   Roll left and right assist level: Supervision/Verbal cueing    Sit to lying activity   Sit to lying assist level: Supervision/Verbal cueing    Lying to sitting on side of bed activity   Lying to sitting on side of bed assist level: the ability to move from lying on the back to sitting on the side of the bed with no back support.: Supervision/Verbal cueing     Care Tool Transfers Sit to stand transfer   Sit to stand assist level: Minimal Assistance - Patient > 75%    Chair/bed transfer   Chair/bed transfer assist level: Minimal Assistance - Patient > 75%    Car transfer   Car transfer assist level: Minimal Assistance - Patient > 75%      Care Tool Locomotion Ambulation   Assist level: Minimal Assistance - Patient > 75% Assistive device: Rollator (HHA and Rollator min A) Max distance: 115  Walk 10 feet activity   Assist level: Minimal Assistance - Patient > 75% Assistive device: Hand held assist   Walk 50 feet with 2 turns activity   Assist level: Minimal Assistance - Patient > 75% Assistive device: Rollator  Walk 150 feet activity Walk 150 feet activity did not occur:  (fatigue)      Walk 10 feet on uneven surfaces activity   Assist level: Moderate Assistance - Patient - 50 - 74%    Stairs   Assist level: Minimal Assistance - Patient > 75% Stairs assistive device: 2 hand rails Max number of stairs: 8 (6 inch)  Walk up/down 1 step activity   Walk up/down 1 step (curb) assist level: Minimal Assistance - Patient > 75% Walk up/down 1 step or curb assistive device: 2 hand rails  Walk up/down 4 steps activity   Walk up/down 4 steps assist level: Minimal Assistance - Patient > 75% Walk up/down 4 steps assistive device: 2 hand rails  Walk up/down 12 steps activity Walk up/down 12 steps activity did not occur: Safety/medical concerns (fatigue)      Pick up small objects from floor   Pick up small object from the floor assist level: Moderate Assistance -  Patient 50 - 74% Pick up small object from the floor assistive device: no AD  Wheelchair Is the patient using a wheelchair?: Yes Type of Wheelchair: Manual   Wheelchair assist level: Dependent - Patient 0%    Wheel 50 feet with 2 turns activity   Assist Level: Dependent - Patient 0%  Wheel 150 feet activity   Assist Level: Dependent - Patient 0%    Refer to Care Plan for Long Term Goals  SHORT TERM GOAL WEEK 1 PT Short Term Goal 1 (Week 1): pt will ambulate 150 feet with LRAD and CGA PT Short Term Goal 2 (Week 1): pt will perform bed to chair transfer with LRAD and CGA PT Short  Term Goal 3 (Week 1): pt will navigate 12 steps with LRAD and CGA  Recommendations for other services: Neuropsych and Therapeutic Recreation  Pet therapy and Stress management  Skilled Therapeutic Intervention Mobility Bed Mobility Bed Mobility: Rolling Right;Rolling Left;Supine to Sit;Sit to Supine Rolling Right: Supervision/verbal cueing Rolling Left: Supervision/Verbal cueing Supine to Sit: Supervision/Verbal cueing Sit to Supine: Supervision/Verbal cueing Transfers Transfers: Sit to Stand;Stand to Sit;Stand Pivot Transfers Sit to Stand: Minimal Assistance - Patient > 75% Stand to Sit: Minimal Assistance - Patient > 75% Transfer (Assistive device): None Locomotion  Gait Ambulation: Yes Gait Assistance: Minimal Assistance - Patient > 75% Gait Distance (Feet): 115 Feet Assistive device: Rollator Gait Assistance Details: Verbal cues for safe use of DME/AE;Verbal cues for precautions/safety;Verbal cues for gait pattern Gait Assistance Details: decreased Gait Gait: Yes Gait Pattern: Impaired Gait Pattern: Step-through pattern;Trunk flexed;Poor foot clearance - left;Abducted- right;Narrow base of support Stairs / Additional Locomotion Stairs: Yes Stairs Assistance: Minimal Assistance - Patient > 75%;Contact Guard/Touching assist Stair Management Technique: Two rails (alternating pattern  ascending, step to pattern descending) Number of Stairs: 8 Height of Stairs: 6 Wheelchair Mobility Wheelchair Mobility: Yes Wheelchair Assistance: Dependent - Patient 0%   Discharge Criteria: Patient will be discharged from PT if patient refuses treatment 3 consecutive times without medical reason, if treatment goals not met, if there is a change in medical status, if patient makes no progress towards goals or if patient is discharged from hospital.  The above assessment, treatment plan, treatment alternatives and goals were discussed and mutually agreed upon: by patient  Today's Interventions   Evaluation completed (see details above and below) with education on PT POC and goals and individual treatment initiated with focus on gait training.   Pt supine in bed upon arrival. Pt agreeable to therapy. Pt denies any pain.   Pt son present throughout session. Pt son confirmed home set up. Home measurement sheet provided. Pt son expressed concern with rollator to fit in pt narrow bathroom. Requested pt son to bring in pt tennis shoes.   Bed mobility: supervision Sit to stand: CGA/min A  Stand pivot transfer: min A Gait: x10 feet with no AD and min A with L HHA, x115 feet rollator and CGA/min A (especially with navigating turns to L), and with safety with rollator especially with fatigue.  Car transfer min A with L HHA Stairs: x 8 6 inch steps with B HR and min A with reciprocal stepping for ascending and step to gait for descending, verbal cues provided for positioning of B LE as pt demos tendency to keep in plantarflexed position with descending without cues.      [facility] Watchung, [place], DPT  06/27/2024, 2:21 PM

## 2024-06-27 NOTE — Plan of Care (Signed)
 Problem: Sit to Stand Goal: LTG:  Patient will perform sit to stand in prep for activites of daily living with assistance level (OT) Description: LTG:  Patient will perform sit to stand in prep for activites of daily living with assistance level (OT) Flowsheets (Taken 06/27/2024 1248) LTG: PT will perform sit to stand in prep for activites of daily living with assistance level: Independent with assistive device   Problem: RH Grooming Goal: LTG Patient will perform grooming w/assist,cues/equip (OT) Description: LTG: Patient will perform grooming with assist, with/without cues using equipment (OT) Flowsheets (Taken 06/27/2024 1248) LTG: Pt will perform grooming with assistance level of: Independent with assistive device    Problem: RH Bathing Goal: LTG Patient will bathe all body parts with assist levels (OT) Description: LTG: Patient will bathe all body parts with assist levels (OT) Flowsheets (Taken 06/27/2024 1248) LTG: Pt will perform bathing with assistance level/cueing: Independent with assistive device  LTG: Position pt will perform bathing: Shower Note: Shower chair    Problem: RH Dressing Goal: LTG Patient will perform upper body dressing (OT) Description: LTG Patient will perform upper body dressing with assist, with/without cues (OT). Flowsheets (Taken 06/27/2024 1248) LTG: Pt will perform upper body dressing with assistance level of: Independent with assistive device Goal: LTG Patient will perform lower body dressing w/assist (OT) Description: LTG: Patient will perform lower body dressing with assist, with/without cues in positioning using equipment (OT) Flowsheets (Taken 06/27/2024 1248) LTG: Pt will perform lower body dressing with assistance level of: Independent with assistive device   Problem: RH Toileting Goal: LTG Patient will perform toileting task (3/3 steps) with assistance level (OT) Description: LTG: Patient will perform toileting task (3/3 steps) with assistance level  (OT)  Flowsheets (Taken 06/27/2024 1248) LTG: Pt will perform toileting task (3/3 steps) with assistance level: Independent with assistive device   Problem: RH Functional Use of Upper Extremity Goal: LTG Patient will use RT/LT upper extremity as a (OT) Description: LTG: Patient will use right/left upper extremity as a stabilizer/gross assist/diminished/nondominant/dominant level with assist, with/without cues during functional activity (OT) Flowsheets (Taken 06/27/2024 1248) LTG: Use of upper extremity in functional activities: LUE as nondominant level LTG: Pt will use upper extremity in functional activity with assistance level of: Independent with assistive device   Problem: RH Toilet Transfers Goal: LTG Patient will perform toilet transfers w/assist (OT) Description: LTG: Patient will perform toilet transfers with assist, with/without cues using equipment (OT) Flowsheets (Taken 06/27/2024 1248) LTG: Pt will perform toilet transfers with assistance level of: Independent with assistive device   Problem: RH Tub/Shower Transfers Goal: LTG Patient will perform tub/shower transfers w/assist (OT) Description: LTG: Patient will perform tub/shower transfers with assist, with/without cues using equipment (OT) Flowsheets (Taken 06/27/2024 1248) LTG: Pt will perform tub/shower stall transfers with assistance level of: Independent with assistive device   Problem: RH Memory Goal: LTG Patient will demonstrate ability for day to day recall/carry over during activities of daily living with assistance level (OT) Description: LTG:  Patient will demonstrate ability for day to day recall/carry over during activities of daily living with assistance level (OT). Flowsheets (Taken 06/27/2024 1248) LTG:  Patient will demonstrate ability for day to day recall/carry over during activities of daily living with assistance level (OT): Modified Independent   Problem: RH Awareness Goal: LTG: Patient will demonstrate awareness  during functional activites type of (OT) Description: LTG: Patient will demonstrate awareness during functional activites type of (OT) Flowsheets (Taken 06/27/2024 1248) Patient will demonstrate awareness during functional activites type  of: Anticipatory LTG: Patient will demonstrate awareness during functional activites type of (OT): Modified Independent

## 2024-06-27 NOTE — Progress Notes (Signed)
 Inpatient Rehabilitation Center Individual Statement of Services  Patient Name:  Monica Austin  Date:  06/27/2024  Welcome to the Inpatient Rehabilitation Center.  Our goal is to provide you with an individualized program based on your diagnosis and situation, designed to meet your specific needs.  With this comprehensive rehabilitation program, you will be expected to participate in at least 3 hours of rehabilitation therapies Monday-Friday, with modified therapy programming on the weekends.  Your rehabilitation program will include the following services:  Physical Therapy (PT), Occupational Therapy (OT), Speech Therapy (ST), 24 hour per day rehabilitation nursing, Therapeutic Recreaction (TR), Neuropsychology, Care Coordinator, Rehabilitation Medicine, Nutrition Services, and Pharmacy Services  Weekly team conferences will be held on Wednesday to discuss your progress.  Your Inpatient Rehabilitation Care Coordinator will talk with you frequently to get your input and to update you on team discussions.  Team conferences with you and your family in attendance may also be held.  Expected length of stay: 10-14 days  Overall anticipated outcome: Independent with device  Depending on your progress and recovery, your program may change. Your Inpatient Rehabilitation Care Coordinator will coordinate services and will keep you informed of any changes. Your Inpatient Rehabilitation Care Coordinator's name and contact numbers are listed  below.  The following services may also be recommended but are not provided by the Inpatient Rehabilitation Center:  Driving Evaluations Home Health Rehabiltiation Services Outpatient Rehabilitation Services Vocational Rehabilitation   Arrangements will be made to provide these services after discharge if needed.  Arrangements include referral to agencies that provide these services.  Your insurance has been verified to be:  BCBS Your primary doctor is:  [name]- located in [place]   Pertinent information will be shared with your doctor and your insurance company.  Inpatient Rehabilitation Care Coordinator:  Rhoda Clement, KEN [phone] or ([phone]  Information discussed with and copy given to patient by: [name], 06/27/2024, 10:00 AM

## 2024-06-27 NOTE — Plan of Care (Signed)
  Problem: Nutritional: Goal: Maintenance of adequate nutrition will improve Outcome: Progressing   Problem: Skin Integrity: Goal: Risk for impaired skin integrity will decrease Outcome: Progressing   Problem: Consults Goal: Diabetes Guidelines if Diabetic/Glucose > 140 Description: If diabetic or lab glucose is > 140 mg/dl - Initiate Diabetes/Hyperglycemia Guidelines & Document Interventions  Outcome: Progressing   Problem: RH BLADDER ELIMINATION Goal: RH STG MANAGE BLADDER WITH ASSISTANCE Description: STG Manage Bladder With toileting Assistance Outcome: Progressing

## 2024-06-27 NOTE — Evaluation (Signed)
 Occupational Therapy Assessment and Plan  Patient Details  Name: Monica Austin MRN: [id number] Date of Birth: 1956/03/06  OT Diagnosis: abnormal posture, ataxia, cognitive deficits, and muscle weakness (generalized) Rehab Potential: Rehab Potential (ACUTE ONLY): Good ELOS:     Today's Date: 06/27/2024 OT Individual Time: 9097-8996 OT Individual Time Calculation (min): 61 min     Hospital Problem: Principal Problem:   Acute ischemic right MCA stroke (HCC)   Past Medical History:  Past Medical History:  Diagnosis Date   Diverticulosis    Fatigue    Hyperlipidemia    Hypertension    NIDDM (non-insulin  dependent diabetes mellitus)    Borderline   Past Surgical History:  Past Surgical History:  Procedure Laterality Date   BREAST BIOPSY     BTL     COLONOSCOPY  02/17/2020   HERNIA REPAIR     OVARIAN CYST REMOVAL      Assessment & Plan Clinical Impression: Patient is a 68 y.o. year old  R handed female with history of HTN, T2DM- A1c 9.7 (per pt usually ~6.5-7.0), hyperlipidemia, medication non-compliance; who was admitted to hospital while vacationing in [place]  on 06/21/24 with elevated BP- 216/66, left sided weakness, slurred speech and dysphagia. LSN the night before she went to bed and did not receive thrombolysis. She was found to have right basal ganglia infarct with moderate to severe narrowing B-PCA, moderate to severe narrowing R-VA, severe focal narrowing mid portion of L-VA, severe focal narrowing M2 L-MCA,  distal M1 R-MCA as well as incidental findings of diffuse enlargement of thyroid gland with multiple small hypodense nodules and multilevel cervical spondylosis. DAPT recommended. She left AMA, was noted to have issues with aspiration enroute to [facility] and presented to [facility] ED for evaluation and transferred to [facility] for management. Dr. Jerrie recommended 90 day duration of DAPT followed by ASA alone. She was started on rocephin  as found to have E coli UTI and transitioned to  Keflex  to complete 7 day course of treatment. T2DM poorly controlled with A1C-9.7 and insulin  recommended by diabetes coordinator.    Speech therapy evaluation showed oropharyngeal dysphagia with impaired mastication and penetration per MBS. Current recommendations are -D3, thin liquids recommended and if coughing persists try chin tuck (reduce amount of penetration) and remove straws. 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 She spends times between [facility] and MD-->Lives with grandson and works for [employer] in [place]. She does not have local MD. She was independent PTA and CIR recommended due to functional decline.   Patient currently requires min with basic self-care skills secondary to muscle weakness, decreased cardiorespiratoy endurance, impaired timing and sequencing, ataxia, decreased coordination, and decreased motor planning, decreased problem solving and decreased safety awareness, and decreased standing balance, decreased postural control, and decreased balance strategies.  Prior to hospitalization, patient could complete all ADLS, IADLs with independent .  Patient will benefit from skilled intervention to decrease level of assist with basic self-care skills and increase independence with basic self-care skills prior to discharge home with care partner.  Anticipate patient will require intermittent sup for IADLS, higher ADLs and follow up outpatient.  OT - End of Session Activity Tolerance: Tolerates 30+ min activity with multiple rests Endurance Deficit: Yes OT Assessment Rehab Potential (ACUTE ONLY): Good OT Barriers to Discharge: Decreased caregiver support OT Barriers to Discharge Comments: family not  able to provide 24/7 assist OT Patient demonstrates impairments in the following area(s):  Balance;Safety;Motor;Endurance;Other (Comment) (strength) OT Basic ADL's Functional Problem(s): Grooming;Bathing;Dressing;Toileting OT Transfers Functional Problem(s): Tub/Shower OT Additional Impairment(s): Fuctional Use of Upper Extremity OT Plan OT Intensity: Minimum of 1-2 x/day, 45 to 90 minutes OT Frequency: 5 out of 7 days OT Duration/Estimated Length of Stay: 12-14 days OT Treatment/Interventions: Balance/vestibular training;Discharge planning;Functional electrical stimulation;UE/LE Strength taining/ROM;Psychosocial support;Neuromuscular re-education;DME/adaptive equipment instruction;Community reintegration;Visual/perceptual remediation/compensation;UE/LE Coordination activities;Therapeutic Activities;Self Care/advanced ADL retraining;Cognitive remediation/compensation;Functional mobility training;Disease mangement/prevention;Patient/family education;Therapeutic Exercise OT Self Feeding Anticipated Outcome(s): Mod I OT Basic Self-Care Anticipated Outcome(s): mod I OT Toileting Anticipated Outcome(s): mod I OT Bathroom Transfers Anticipated Outcome(s): mod I OT Recommendation Patient destination: Home Follow Up Recommendations: Home health OT Equipment Recommended: 3 in 1 bedside comode;Rolling walker with 5 wheels   OT Evaluation Precautions/Restrictions  Precautions Precautions: Fall Precaution/Restrictions Comments: L deficits Restrictions Weight Bearing Restrictions Per Provider Order: No General Chart Reviewed: Yes Response to Previous Treatment: Not applicable Family/Caregiver Present: No Pain Pain Assessment Pain Scale: Faces Pain Score: 0-No pain Faces Pain Scale: No hurt Home Living/Prior Functioning Home Living Family/patient expects to be discharged to:: Private residence Living Arrangements: Other relatives Available Help at Discharge: Family, Available 24 hours/day Type of Home: House Home Access: Stairs to enter (7 steps in) Secretary/administrator of  Steps: 7 Entrance Stairs-Rails: Can reach both Home Layout: One level, Laundry or work area in basement [employer] Shower/Tub: Engineer, manufacturing systems: Standard (comfort)  Lives With: Family IADL History Current License: Yes Mode of Transportation: Set designer Occupation: Full time employment Type of Occupation: Engineer, water, Printmaker Prior Function Level of Independence: Independent with basic ADLs  Able to Take Stairs?: Yes Vocation: Full time employment Vision Baseline Vision/History: 1 Wears glasses Ability to See in Adequate Light: 0 Adequate Patient Visual Report: No change from baseline Vision Assessment?: No apparent visual deficits Perception  Perception: Within Functional Limits Praxis Praxis: Impaired Praxis Impairment Details: Motor planning Cognition Cognition Overall Cognitive Status: Impaired/Different from baseline Arousal/Alertness: Awake/alert Memory: Impaired Memory Impairment: Decreased recall of new information Attention: Sustained;Selective Sustained Attention: Impaired Sustained Attention Impairment: Verbal basic;Functional basic Selective Attention: Impaired Selective Attention Impairment: Verbal basic;Functional basic Awareness: Impaired Awareness Impairment: Intellectual impairment;Emergent impairment;Anticipatory impairment Problem Solving: Impaired Problem Solving Impairment: Verbal basic;Functional basic Executive Function: Reasoning;Organizing;Sequencing Reasoning: Impaired Reasoning Impairment: Functional basic;Verbal basic Sequencing: Impaired Sequencing Impairment: Functional basic;Verbal basic Organizing: Impaired Organizing Impairment: Functional basic;Verbal basic Safety/Judgment: Impaired Brief Interview for Mental Status (BIMS) Repetition of Three Words (First Attempt): 3 Temporal Orientation: Year: Correct Temporal Orientation: Month: Accurate within 5 days Temporal Orientation: Day: Correct Recall: Sock: Yes, no cue  required Recall: Blue: Yes, no cue required Recall: Bed: Yes, no cue required BIMS Summary Score: 15 Sensation Sensation Light Touch: Impaired by gross assessment Coordination Gross Motor Movements are Fluid and Coordinated: No Fine Motor Movements are Fluid and Coordinated: No Coordination and Movement Description: decreased speed and not fluid Finger Nose Finger Test: slow with accuracy Motor  Motor Motor: Ataxia;Abnormal postural alignment and control  Trunk/Postural Assessment  Cervical Assessment Cervical Assessment: Exceptions to [facility] (forward head) Thoracic Assessment Thoracic Assessment: Exceptions to [facility] (kyphosis) Lumbar Assessment Lumbar Assessment: Within Functional Limits Postural Control Postural Control: Deficits on evaluation  Balance Balance Balance Assessed: Yes Dynamic Sitting Balance Dynamic Sitting - Level of Assistance: 5: Stand by assistance (CGA) Dynamic Standing Balance Dynamic Standing - Level of Assistance: 4: Min assist Extremity/Trunk Assessment RUE Assessment RUE Assessment: Within Functional Limits Active Range of Motion (AROM)  Comments: WFL General Strength Comments: 4-5/5 LUE Assessment LUE Assessment: Exceptions to [facility] Active Range of Motion (AROM) Comments: AROM WFL- slight decrease from R side. AROM 160 General Strength Comments: 3+ to 4-/5  Care Tool Care Tool Self Care Eating   Eating Assist Level: Supervision/Verbal cueing    Oral Care    Oral Care Assist Level: Supervision/Verbal cueing    Bathing   Body parts bathed by patient: Left upper leg;Right arm;Left arm;Right lower leg;Chest;Abdomen;Left lower leg;Face;Front perineal area;Right upper leg;Buttocks Body parts bathed by helper: Buttocks   Assist Level: Minimal Assistance - Patient > 75%    Upper Body Dressing(including orthotics)   What is the patient wearing?: Pull over shirt   Assist Level: Set up assist    Lower Body Dressing (excluding footwear)   What  is the patient wearing?: Pants;Underwear/pull up Assist for lower body dressing: Minimal Assistance - Patient > 75%    Putting on/Taking off footwear   What is the patient wearing?: Socks;Non-skid slipper socks Assist for footwear: Minimal Assistance - Patient > 75%       Care Tool Toileting Toileting activity         Care Tool Bed Mobility Roll left and right activity        Sit to lying activity        Lying to sitting on side of bed activity         Care Tool Transfers Sit to stand transfer   Sit to stand assist level: Minimal Assistance - Patient > 75%    Chair/bed transfer   Chair/bed transfer assist level: Minimal Assistance - Patient > 75%     Toilet transfer   Assist Level: Minimal Assistance - Patient > 75%     Care Tool Cognition  Expression of Ideas and Wants Expression of Ideas and Wants: 3. Some difficulty - exhibits some difficulty with expressing needs and ideas (e.g, some words or finishing thoughts) or speech is not clear  Understanding Verbal and Non-Verbal Content Understanding Verbal and Non-Verbal Content: 4. Understands (complex and basic) - clear comprehension without cues or repetitions   Memory/Recall Ability Memory/Recall Ability : Current season;That he or she is in a hospital/hospital unit   Refer to Care Plan for Long Term Goals  SHORT TERM GOAL WEEK 1 OT Short Term Goal 1 (Week 1): patient will demonstrate increased transfers completing with CGA following taught strategies to improve balance OT Short Term Goal 2 (Week 1): patient will complete bilateral UE activity with 1-2 verbal cues for placement. OT Short Term Goal 3 (Week 1): patient will complete LB dressing with CGA to increase dynamic sitting and standing balane OT Short Term Goal 4 (Week 1): patient will demonstrate increased safety awareness verbalizing and completing 2 safety strategies with 2 or fewer cues.  Recommendations for other services: Neuropsych and Therapeutic  Recreation  Stress management   Skilled Therapeutic Intervention  Patient agreeable to participate in OT session. Reports 0 pain level.   Patient participated in skilled OT session focusing on ADL participation, including showering, dressing, grooming, etc. Patient able to complete activities CGA to min A. Patient required cues for safety and tips for dressing to increase efficiency. Therapist facilitated/increased mobility and dynamic balance in order to improve ADLs.   ADL ADL Grooming: Contact guard Where Assessed-Grooming: Standing at sink Upper Body Bathing: Contact guard Where Assessed-Upper Body Bathing: Shower Lower Body Bathing: Minimal assistance Where Assessed-Lower Body Bathing: Shower Upper Body Dressing: Contact guard Where Assessed-Upper Body Dressing: [place]  of bed Lower Body Dressing: Minimal assistance Where Assessed-Lower Body Dressing: Edge of bed Toilet Transfer: Minimal assistance Toilet Transfer Method: Proofreader: Psychiatric nurse: Insurance underwriter Method: Manufacturing systems engineer with back Mobility  Transfers Sit to Stand: Minimal Assistance - Patient > 75% Stand to Sit: Minimal Assistance - Patient > 75%   Discharge Criteria: Patient will be discharged from OT if patient refuses treatment 3 consecutive times without medical reason, if treatment goals not met, if there is a change in medical status, if patient makes no progress towards goals or if patient is discharged from hospital.  The above assessment, treatment plan, treatment alternatives and goals were discussed and mutually agreed upon: by patient and by family  D'mariea L Miles Leyda OTR/L  06/27/2024, 12:55 PM

## 2024-06-27 NOTE — Evaluation (Signed)
 Speech Language Pathology Assessment and Plan  Patient Details  Name: Monica Austin MRN: [id number] Date of Birth: 08-10-1956  SLP Diagnosis: Dysarthria;Cognitive Impairments;Dysphagia  Rehab Potential: Good ELOS: 10-14 days    Today's Date: 06/27/2024 SLP Individual Time: 1103-1150 SLP Individual Time Calculation (min): 47 min   Hospital Problem: Principal Problem:   Acute ischemic right MCA stroke [facility])  Past Medical History:  Past Medical History:  Diagnosis Date   Diverticulosis    Fatigue    Hyperlipidemia    Hypertension    NIDDM (non-insulin  dependent diabetes mellitus)    Borderline   Past Surgical History:  Past Surgical History:  Procedure Laterality Date   BREAST BIOPSY     BTL     COLONOSCOPY  02/17/2020   HERNIA REPAIR     OVARIAN CYST REMOVAL      Assessment / Plan / Recommendation Clinical Impression  Pt is a 68 y/o female with PMH of HTN, HLD, and DM who presented to [facility] on 6/30 with L hemiparesis. Reportedly she was admitted to hospital in [facility] for same, but left against medical advise to return to [facility] for further workup. 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 EF noted to be 60-65% with diastolic dysfunction. At [facility], she was noted to have BP 156/95, BUN 16, creatinine 1.01, glucose 253, Hgb A1C 9.7, UA consistent with UTI. NIHSS on admit 7. Recommendations are for aspirin  and plavix  x90 days, then aspirin  alone. Hospital course DM management, and abx for UTI. Therapy evaluations were completed and pt was recommended for CIR.   Bedside Swallow Evaluation: SLP conducted bedside swallow evaluation to assess tolerance of current Dys3/thin liquid diet. Oral mechanism exam revealed left sided weakness and reduced respiratory support for speech. Across trials of thin liquid, Dys3 solids, and puree, patient exhibited no overt s/sx of penetration/aspiration  though did exhibit ongoing oral deficits such as slow bolus transit, impaired mastication, and oral residue after the swallow. Per MBS, patient exhibits an oral more than pharyngeal dysphagia with deficits in bolus control resulting in posterior spillage. Recommend continuation of current diet with medications administered whole in puree given oral deficits. Patient would benefit from continued SLP services targeting aforementioned deficits.  Cognitive-Linguistic Evaluation: Patient presents with   cognitive deficits in the areas of awareness, attention, problem solving, as demonstrated by performance on COGNISTAT and throughout patient interview. Prior to admission, patient was independent and worked as a Research scientist (medical) for [employer]. Upon entry, patient attempting to log in to work via laptop but unable to recall any passwords. Patient demonstrates mild motor speech deficits characterized by imprecise articulation and reduced respiratory support for speech resulting in hoarseness and low vocal intensity. Patient would benefit from continued SLP services to target aforementioned deficits. Patient was left in room with call bell in reach and alarm set. SLP will continue to target goals per plan of care.     Skilled Therapeutic Interventions          SLP conducted skilled evaluation session to assess cognitive-linguistic and swallowing function. Utilized bedside swallow evaluation, COGNISTAT,  and patient and/or family interview. Full results above.   SLP Assessment  Patient will need skilled Speech Lanaguage Pathology Services during CIR admission    Recommendations  SLP Diet Recommendations: Dysphagia 3 (Mech soft);Thin Liquid Administration via: Straw Medication Administration: Whole meds with puree Supervision: Patient able to self feed;Full supervision/cueing for  compensatory strategies Compensations: Minimize environmental distractions;Slow rate;Small sips/bites;Monitor for anterior loss Postural Changes  and/or Swallow Maneuvers: Seated upright 90 degrees Oral Care Recommendations: Oral care BID Recommendations for Other Services: Neuropsych consult Patient destination: Home Follow up Recommendations: Home Health SLP;Outpatient SLP;24 hour supervision/assistance Equipment Recommended: None recommended by SLP    SLP Frequency 3 to 5 out of 7 days   SLP Duration  SLP Intensity  SLP Treatment/Interventions 10-14 days  Minumum of 1-2 x/day, 30 to 90 minutes  Cognitive remediation/compensation;Functional tasks;Cueing hierarchy;Internal/external aids;Patient/family education;Medication managment;Speech/Language facilitation;Dysphagia/aspiration precaution training;Therapeutic Activities;Environmental controls    Pain Pain Assessment Pain Scale: Faces Pain Score: 0-No pain Faces Pain Scale: No hurt  Prior Functioning Cognitive/Linguistic Baseline: Within functional limits Type of Home: House  Lives With: Family Available Help at Discharge: Family;Available 24 hours/day Vocation: Full time employment  SLP Evaluation Cognition Overall Cognitive Status: Impaired/Different from baseline Arousal/Alertness: Awake/alert Orientation Level: Oriented X4 Attention: Sustained;Selective Sustained Attention: Impaired Sustained Attention Impairment: Verbal basic;Functional basic Selective Attention: Impaired Selective Attention Impairment: Verbal basic;Functional basic Memory: Impaired Memory Impairment: Decreased recall of new information Awareness: Impaired Awareness Impairment: Intellectual impairment;Emergent impairment;Anticipatory impairment Problem Solving: Impaired Problem Solving Impairment: Verbal basic;Functional basic Executive Function: Reasoning;Organizing;Sequencing Reasoning: Impaired Reasoning Impairment: Functional basic;Verbal basic Sequencing: Impaired Sequencing Impairment: Functional basic;Verbal basic Organizing: Impaired Organizing Impairment: Functional  basic;Verbal basic Safety/Judgment: Impaired  Comprehension Auditory Comprehension Overall Auditory Comprehension: Appears within functional limits for tasks assessed Expression Expression Primary Mode of Expression: Verbal Verbal Expression Overall Verbal Expression: Appears within functional limits for tasks assessed Written Expression Dominant Hand: Right Oral Motor Oral Motor/Sensory Function Overall Oral Motor/Sensory Function: Mild impairment Facial ROM: Reduced left;Suspected CN VII (facial) dysfunction Facial Symmetry: Abnormal symmetry left;Suspected CN VII (facial) dysfunction Facial Strength: Reduced left;Suspected CN VII (facial) dysfunction Lingual ROM: Within Functional Limits Lingual Symmetry: Abnormal symmetry left;Suspected CN XII (hypoglossal) dysfunction;Within Functional Limits Lingual Strength: Reduced Motor Speech Overall Motor Speech: Impaired Respiration: Impaired Level of Impairment: Phrase Phonation: Hoarse;Low vocal intensity;Breathy Resonance: Within functional limits Articulation: Impaired Level of Impairment: Sentence Intelligibility: Intelligibility reduced Word: 75-100% accurate Phrase: 75-100% accurate Sentence: 75-100% accurate Motor Planning: Within functional limits Motor Speech Errors: Aware;Consistent Effective Techniques: Increased vocal intensity;Over-articulate  Care Tool Care Tool Cognition Ability to hear (with hearing aid or hearing appliances if normally used Ability to hear (with hearing aid or hearing appliances if normally used): 0. Adequate - no difficulty in normal conservation, social interaction, listening to TV   Expression of Ideas and Wants Expression of Ideas and Wants: 3. Some difficulty - exhibits some difficulty with expressing needs and ideas (e.g, some words or finishing thoughts) or speech is not clear   Understanding Verbal and Non-Verbal Content Understanding Verbal and Non-Verbal Content: 4. Understands  (complex and basic) - clear comprehension without cues or repetitions  Memory/Recall Ability Memory/Recall Ability : Current season;That he or she is in a hospital/hospital unit   Intelligibility: Intelligibility reduced Word: 75-100% accurate Phrase: 75-100% accurate Sentence: 75-100% accurate  Bedside Swallowing Assessment General Previous Swallow Assessment: MBS 7/1 Diet Prior to this Study: Dysphagia 3 (mechanical soft);Thin liquids (Level 0) Temperature Spikes Noted: No Respiratory Status: Room air History of Recent Intubation: No Behavior/Cognition: Alert;Cooperative;Pleasant mood;Requires cueing Oral Cavity - Dentition: Adequate natural dentition Vision: Functional for self-feeding Patient Positioning: Upright in bed Baseline Vocal Quality: Breathy;Hoarse;Low vocal intensity Volitional Cough: Weak Volitional Swallow: Able to elicit  Oral Care Assessment Oral Assessment  (WDL): Within Defined Limits Lips: Symmetrical;Smooth;Pink Teeth: Missing (Comment) Tongue: Pink;Moist Mucous Membrane(s): Moist;Pink Level  of Consciousness: Alert Is patient on any of following O2 devices?: None of the above Nutritional status: Dysphagia Oral Assessment Risk : High Risk Ice Chips Ice chips: Not tested Thin Liquid Thin Liquid: Within functional limits Presentation: Straw Oral Phase Impairments: Reduced labial seal Nectar Thick Nectar Thick Liquid: Not tested Honey Thick Honey Thick Liquid: Not tested Puree Puree: Impaired Presentation: Self Fed;Spoon Oral Phase Impairments: Reduced labial seal Oral Phase Functional Implications: Left anterior spillage;Prolonged oral transit Solid Solid: Impaired Presentation: Self Fed Oral Phase Impairments: Impaired mastication Oral Phase Functional Implications: Left anterior spillage;Impaired mastication;Oral residue;Prolonged oral transit BSE Assessment Risk for Aspiration Impact on safety and function: Mild aspiration risk Other  Related Risk Factors: Cognitive impairment  Short Term Goals: Week 1: SLP Short Term Goal 1 (Week 1): Patient will attend to function therapy tasks for 20 minutes given min assist. SLP Short Term Goal 2 (Week 1): Patient will solve mildly complex problems with 80% accuracy given min verbal cues. SLP Short Term Goal 3 (Week 1): Patient will tolerate upgraded solid trials orally with min assist for use of compensatory swallowing strategies. SLP Short Term Goal 4 (Week 1): Patient will indicate awareness of changes to cognitive status by stating 2 cognitive deficits given min assist. SLP Short Term Goal 5 (Week 1): Patient will utilize SLOP speech intelligibility strategies in 80% of opportunities at the sentence level with min assist.  Refer to Care Plan for Long Term Goals  Recommendations for other services: Neuropsych  Discharge Criteria: Patient will be discharged from SLP if patient refuses treatment 3 consecutive times without medical reason, if treatment goals not met, if there is a change in medical status, if patient makes no progress towards goals or if patient is discharged from hospital.  The above assessment, treatment plan, treatment alternatives and goals were discussed and mutually agreed upon: by patient  Okla Qazi, M.A., CCC-SLP  Carver Murakami A Dickson Kostelnik 06/27/2024, 11:55 AM

## 2024-06-28 ENCOUNTER — Inpatient Hospital Stay (HOSPITAL_COMMUNITY)

## 2024-06-28 DIAGNOSIS — I63511 Cerebral infarction due to unspecified occlusion or stenosis of right middle cerebral artery: Secondary | ICD-10-CM | POA: Diagnosis not present

## 2024-06-28 LAB — GLUCOSE, CAPILLARY
Glucose-Capillary: 125 mg/dL — ABNORMAL HIGH (ref 70–99)
Glucose-Capillary: 146 mg/dL — ABNORMAL HIGH (ref 70–99)
Glucose-Capillary: 80 mg/dL (ref 70–99)

## 2024-06-28 MED ORDER — MAGNESIUM GLUCONATE 500 (27 MG) MG PO TABS
250.0000 mg | ORAL_TABLET | Freq: Every day | ORAL | Status: DC
  Administered 2024-06-28 – 2024-07-11 (×14): 250 mg via ORAL
  Filled 2024-06-28 (×14): qty 1

## 2024-06-28 MED ORDER — METFORMIN HCL 850 MG PO TABS
850.0000 mg | ORAL_TABLET | Freq: Two times a day (BID) | ORAL | Status: DC
  Administered 2024-06-28 – 2024-06-29 (×2): 850 mg via ORAL
  Filled 2024-06-28 (×2): qty 1

## 2024-06-28 MED ORDER — VITAMIN D 25 MCG (1000 UNIT) PO TABS
1000.0000 [IU] | ORAL_TABLET | Freq: Every day | ORAL | Status: DC
  Administered 2024-06-28 – 2024-07-06 (×9): 1000 [IU] via ORAL
  Filled 2024-06-28 (×9): qty 1

## 2024-06-28 NOTE — Plan of Care (Signed)
  Problem: RH BLADDER ELIMINATION Goal: RH STG MANAGE BLADDER WITH ASSISTANCE Description: STG Manage Bladder With toileting Assistance Outcome: Progressing   Problem: RH KNOWLEDGE DEFICIT Goal: RH STG INCREASE KNOWLEDGE OF DYSPHAGIA/FLUID INTAKE Description: Patient and dtr will be able to manage dysphagia using educational resources for medications, and dietary modification independently Outcome: Progressing

## 2024-06-28 NOTE — Progress Notes (Signed)
 Endorsing burning sensation in right shin. Nauseous and perspiration visible this evening. Politely refusing medication for nausea. Reports everything taste so sweet. Given a cool washcloth.

## 2024-06-28 NOTE — Progress Notes (Signed)
 Occupational Therapy Session Note  Patient Details  Name: Monica Austin MRN: [id number] Date of Birth: 11-Oct-1956  Today's Date: 06/28/2024 OT Individual Time: 1300-1330 OT Individual Time Calculation (min): 30 min    Short Term Goals: Week 1:  OT Short Term Goal 1 (Week 1): patient will demonstrate increased transfers completing with CGA following taught strategies to improve balance OT Short Term Goal 2 (Week 1): patient will complete bilateral UE activity with 1-2 verbal cues for placement. OT Short Term Goal 3 (Week 1): patient will complete LB dressing with CGA to increase dynamic sitting and standing balane OT Short Term Goal 4 (Week 1): patient will demonstrate increased safety awareness verbalizing and completing 2 safety strategies with 2 or fewer cues.  Skilled Therapeutic Interventions/Progress Updates: nursing reported patient previously had small quantity vomiting. Upon offer of OT services to patient she stated she was nauseated and fatigued and concurred to complete ROM and strengthening exercises in bed.   Left UE presented weaker (particularly at middle anterior deltoid) than right.    Patient was left supine with HOB elevated in bed and bed alarm engaged.   She demonstrated understanding of call bell use for help.  As physically able, patient will benefit from continued OT services to address goals and increased independence.  Continue patient POC     Therapy Documentation Precautions:  Precautions Precautions: Fall Precaution/Restrictions Comments: L hemiparesis Restrictions Weight Bearing Restrictions Per Provider Order: No General: General OT Amount of Missed Time: 45 Minutes c/o nausea.   Throw up reported slightly prior by staff Vital Signs:   Pain:dwnied   ADL:  Vision   Perception    Praxis   Balance   Exercises:   Other Treatments:     Therapy/Group: Individual Therapy  Waneta Medici [facility] 06/28/2024, 5:43 PM

## 2024-06-28 NOTE — Progress Notes (Signed)
 Speech Language Pathology Daily Session Note  Patient Details  Name: Monica Austin MRN: [id number] Date of Birth: Oct 02, 1956  Today's Date: 06/28/2024 SLP Individual Time: 0900-0925 SLP Individual Time Calculation (min): 25 min  Short Term Goals: Week 1: SLP Short Term Goal 1 (Week 1): Patient will attend to function therapy tasks for 20 minutes given min assist. SLP Short Term Goal 2 (Week 1): Patient will solve mildly complex problems with 80% accuracy given min verbal cues. SLP Short Term Goal 3 (Week 1): Patient will tolerate upgraded solid trials orally with min assist for use of compensatory swallowing strategies. SLP Short Term Goal 4 (Week 1): Patient will indicate awareness of changes to cognitive status by stating 2 cognitive deficits given min assist. SLP Short Term Goal 5 (Week 1): Patient will utilize SLOP speech intelligibility strategies in 80% of opportunities at the sentence level with min assist.  Skilled Therapeutic Interventions: Skilled therapy session focused on cognitive and dysarthria goals. SLP facilitated session by introducing self and encouraging patient to share biographical information. Patient shared information regarding birthplace, job and family. Patient with limited awareness of cognitive and physical changes requiring mod-maxA to recall changes in cognition, physical abilties and speech. As patient began money management task to target problem solving, patient with several episodes of emesis and dry heaving. Remainder of session missed due to patient illness. Patient left in bed with nursing present. Continue POC.  Pain Emesis - nursing aware  Therapy/Group: Individual Therapy  Sania Noy M.A., CCC-SLP 06/28/2024, 7:37 AM

## 2024-06-28 NOTE — Progress Notes (Signed)
 PROGRESS NOTE   Subjective/Complaints: Emesis this morning, feeling better afterward Feels it may be related to medications, nursing noted that patient had nausea after receiving medications yesterday evening as well  ROS: +nausea yesterday, +emesis today  Objective:   No results found. Recent Labs    06/27/24 0440  WBC 7.4  HGB 12.2  HCT 36.4  PLT 242   Recent Labs    06/27/24 0440  NA 137  K 3.8  CL 106  CO2 24  GLUCOSE 152*  BUN 15  CREATININE 0.86  CALCIUM  9.1    Intake/Output Summary (Last 24 hours) at 06/28/2024 1151 Last data filed at 06/28/2024 0800 Gross per 24 hour  Intake 240 ml  Output --  Net 240 ml        Physical Exam: Vital Signs Blood pressure (!) 153/71, pulse 64, temperature 98.3 F (36.8 C), temperature source Oral, resp. rate 18, height 5' 7 (1.702 m), weight (S) 87.4 kg, SpO2 98%. Gen: no distress, normal appearing HEENT: oral mucosa pink and moist, NCAT Cardio: Reg rate Chest: normal effort, normal rate of breathing Abd: soft, non-distended Ext: no edema Psych: pleasant, normal affect Skin: intact Musculoskeletal:     Cervical back: Neck supple. No tenderness.     Comments: RUE 5/5 thorughout LUE- ~4/5 ina ll muscles except FA which is 2+/5 RLE- 5/5 throughout LLE- 4+ throughout, stable 7/5   Skin:    General: Skin is warm and dry.     Comments: No wounds seen  Neurological:     Mental Status: She is alert.     Comments: Left facial weakness with mild dysarthria. Able to answer orientation questions without difficulty and follow simple one and two step commands. Left sided weakness.  Knows in [facility] on July 3rd 2025 and why here, however slowed /delayed responses- also knows next holiday is 4th of July- tomorrow.  Intact to light touch in all 4 extremities   Psychiatric:     Comments: Extremely flat    Assessment/Plan: 1. Functional deficits which require 3+  hours per day of interdisciplinary therapy in a comprehensive inpatient rehab setting. Physiatrist is providing close team supervision and 24 hour management of active medical problems listed below. Physiatrist and rehab team continue to assess barriers to discharge/monitor patient progress toward functional and medical goals  Care Tool:  Bathing    Body parts bathed by patient: Left upper leg, Right arm, Left arm, Right lower leg, Chest, Abdomen, Left lower leg, Face, Front perineal area, Right upper leg, Buttocks   Body parts bathed by helper: Buttocks     Bathing assist Assist Level: Minimal Assistance - Patient > 75%     Upper Body Dressing/Undressing Upper body dressing   What is the patient wearing?: Pull over shirt    Upper body assist Assist Level: Set up assist    Lower Body Dressing/Undressing Lower body dressing      What is the patient wearing?: Pants, Underwear/pull up     Lower body assist Assist for lower body dressing: Minimal Assistance - Patient > 75%     Toileting Toileting    Toileting assist       Transfers  Chair/bed transfer  Transfers assist     Chair/bed transfer assist level: Minimal Assistance - Patient > 75%     Locomotion Ambulation   Ambulation assist      Assist level: Minimal Assistance - Patient > 75% Assistive device: Rollator (HHA and Rollator min A) Max distance: 115   Walk 10 feet activity   Assist     Assist level: Minimal Assistance - Patient > 75% Assistive device: Hand held assist   Walk 50 feet activity   Assist    Assist level: Minimal Assistance - Patient > 75% Assistive device: Rollator    Walk 150 feet activity   Assist Walk 150 feet activity did not occur:  (fatigue)         Walk 10 feet on uneven surface  activity   Assist     Assist level: Moderate Assistance - Patient - 50 - 74%     Wheelchair     Assist Is the patient using a wheelchair?: Yes Type of Wheelchair:  Manual    Wheelchair assist level: Dependent - Patient 0%      Wheelchair 50 feet with 2 turns activity    Assist        Assist Level: Dependent - Patient 0%   Wheelchair 150 feet activity     Assist      Assist Level: Dependent - Patient 0%   Blood pressure (!) 153/71, pulse 64, temperature 98.3 F (36.8 C), temperature source Oral, resp. rate 18, height 5' 7 (1.702 m), weight (S) 87.4 kg, SpO2 98%.  Medical Problem List and Plan: 1. Functional deficits secondary to R BG stroke/R MCA stroke             -patient may  shower             -ELOS/Goals: 10-14 days- supervision to mod I- needs PT, OT and SLP             Grounds pass ordered  Vitamin D  started, level is 31  2.  Antithrombotics: -DVT/anticoagulation:  Pharmaceutical: continue Lovenox              -antiplatelet therapy: DAPT X 3 months followed by ASA  3. Pain Management: tylenol  prn.  4. Mood/Behavior/Sleep: LCSW to follow for evaluation and support.              -antipsychotic agents: N/A 5. Neuropsych/cognition: This patient is capable of making decisions on her own behalf. 6. Skin/Wound Care: Routine pressure relief measures.  7. Fluids/Electrolytes/Nutrition: Monitor I/O. Check CMET in am.  8. E coli UTI: Keflex  antibiotic D#3/7 9. Hypotension fluctuating with hypertension: decrease Toprol  XL to 12.5mg  daily. Avoid hypotension with evidence of intracranial stenosis. Add magnesium  supplement             10.  T2DM: Hgb A1c- 9.7. Monitor BS ac/hs and use SSI for elevated BS. Add CM restrictions to diet.             --Was on metformin  PTA with poor control.  --now on Insulin  glargline. decrease SSI to sensitive.               --RD consulted for education on carb modified diet and Rehab RN for insulin  education.   -provide list of foods for diabetes  -CBGs reviewed  -decrease metformin  to 850mg  BID given emesis after taking medication  11. Dyslipidemia: continue Crestor .   12.  Mild intermittent  asthma: continue Singulair   13. Thyroid nodules/enlargement: Ultrasound for work up after discharge.  14. Emesis: KUB ordered, pending results    LOS: 2 days A FACE TO FACE EVALUATION WAS PERFORMED  Blake Goya P Ilyssa Grennan 06/28/2024, 11:51 AM

## 2024-06-28 NOTE — Progress Notes (Signed)
 Physical Therapy Session Note  Patient Details  Name: Monica Austin MRN: [id number] Date of Birth: Jul 22, 1956  Today's Date: 06/28/2024 PT Individual Time: 1100-1155 PT Individual Time Calculation (min): 55 min   Short Term Goals: Week 1:  PT Short Term Goal 1 (Week 1): pt will ambulate 150 feet with LRAD and CGA PT Short Term Goal 2 (Week 1): pt will perform bed to chair transfer with LRAD and CGA PT Short Term Goal 3 (Week 1): pt will navigate 12 steps with LRAD and CGA  Skilled Therapeutic Interventions/Progress Updates:    Chart reviewed and pt agreeable to therapy. Pt received semi-reclined in bed with no c/o pain. Session focused on amb and dynamic balance to promote safe home access. Pt initiated session with transfer to EOB using CGA. Pt then completed amb of 139ft to therapy gym using CGA + rollator. Pt noted to require strong VC for safe pacing with rollator. Pt then completed 10 mins on NuStep at pace of 65 spm and Level 2. Pt then amb to mat table using CGA + rollator. Pt then completed blocked practice of balance and NMR exercises including B SLS up to 20 secs using close S + no AD + no handhold, 3x10 heel raises using B UE support, and SLS in heel raised position. Pt then completed alt toe taps and ankle circles, with noted coordination deficit in LLE. Session education emphasized continuing ankle circles and toe tapping between session. Pt amb back to room with same assist level and VC for rollator safety. At end of session, pt was left semi-reclined in bed with alarm engaged, nurse call bell and all needs in reach.     Therapy Documentation Precautions:  Precautions Precautions: Fall Precaution/Restrictions Comments: L hemiparesis Restrictions Weight Bearing Restrictions Per Provider Order: No General:        Therapy/Group: Individual Therapy  Liesl Simons G Emmanuelle Hibbitts, PT, DPT 06/28/2024, 12:10 PM

## 2024-06-28 NOTE — Progress Notes (Signed)
 Provider updated about patient being nauseous in the evening hours after given evening medication. Bowel sounds demonstrating an improvement this morning and active. Agreeable to eating fruit discussed fruit she enjoys eating, bananas & grapes. Dietary contacted an explained fruits can have for snack with being D3 diet. Bananas are were OK. Patient also requesting medication to be given whole not in apple sauce. Tried pudding day prior but patient wanting medication given whole with water. Contact speech therapy team to discuss patient request.

## 2024-06-29 ENCOUNTER — Inpatient Hospital Stay (HOSPITAL_COMMUNITY)

## 2024-06-29 DIAGNOSIS — I63511 Cerebral infarction due to unspecified occlusion or stenosis of right middle cerebral artery: Secondary | ICD-10-CM | POA: Diagnosis not present

## 2024-06-29 LAB — GLUCOSE, CAPILLARY
Glucose-Capillary: 123 mg/dL — ABNORMAL HIGH (ref 70–99)
Glucose-Capillary: 127 mg/dL — ABNORMAL HIGH (ref 70–99)
Glucose-Capillary: 135 mg/dL — ABNORMAL HIGH (ref 70–99)
Glucose-Capillary: 127 mg/dL — ABNORMAL HIGH (ref 70–99)
Glucose-Capillary: 138 mg/dL — ABNORMAL HIGH (ref 70–99)

## 2024-06-29 MED ORDER — IOHEXOL 9 MG/ML PO SOLN: 500.0000 mL | ORAL | Status: AC

## 2024-06-29 MED ORDER — SODIUM CHLORIDE 0.9 % IV SOLN: INTRAVENOUS | Status: AC

## 2024-06-29 MED ORDER — METFORMIN HCL 500 MG PO TABS
500.0000 mg | ORAL_TABLET | Freq: Two times a day (BID) | ORAL | Status: DC
  Administered 2024-06-29 – 2024-06-30 (×2): 500 mg via ORAL
  Filled 2024-06-29 (×2): qty 1

## 2024-06-29 NOTE — Progress Notes (Addendum)
 Continues to have a decrease in appetite. Was able to eat entire of the oatmeal on her tray this morning. Reports everything is too sweet, with regards to hospital food. Endorsing with her blood sugar overnight endorsed sensation of feeling my sugar was low and explained could tell due to burning feeling in my legs. Endorsing upper left quadrant pain this morning. Day prior endorsed upper right quadrant pain. Negative Psoas sign. Upon palpation mass area felt with upper left quadrant area. Endorses pain to that area of her stomach. Able to take some of her medication this morning. However, shortly after taking some of her oral medication this morning having multiple episodes of emesis. Emesis was undigested food. Also, prior to medication administration patient clearing out oral secretions. Provider informed and made aware. Patient showering at this time.

## 2024-06-29 NOTE — Progress Notes (Addendum)
 Patient lower spin in lumbar region when standing up right middle portion caving inward while sides remained in the same position. Eating food with family and tolerating well. Provider made aware and informed. Encouraged to continue to drink fluids.

## 2024-06-29 NOTE — Plan of Care (Signed)
  Problem: RH BLADDER ELIMINATION Goal: RH STG MANAGE BLADDER WITH ASSISTANCE Description: STG Manage Bladder With toileting Assistance Outcome: Progressing   Problem: RH KNOWLEDGE DEFICIT Goal: RH STG INCREASE KNOWLEGDE OF HYPERLIPIDEMIA Description: Patient and dtr will be able to manage HLD using educational resources for medications, and dietary modification independently Outcome: Progressing Goal: RH STG INCREASE KNOWLEDGE OF STROKE PROPHYLAXIS Description: Patient and dtr will be able to manage secondary risks using educational resources for medications, and dietary modification independently Outcome: Progressing

## 2024-06-29 NOTE — Progress Notes (Addendum)
 PROGRESS NOTE   Subjective/Complaints: Finds hospital food too sweet Was able to eat all of oatmeal this morning Still with emesis, discussed diverticular disease on KUB  ROS: +nausea yesterday, +emesis today- continues  Objective:   DG Abd 1 View Result Date: 06/28/2024 CLINICAL DATA:  Emesis EXAM: ABDOMEN - 1 VIEW COMPARISON:  None Available. FINDINGS: Nonobstructed gas pattern with contrast in the colon and rectum. Diverticular disease of the colon. No radiopaque calculi IMPRESSION: Nonobstructed gas pattern. Diverticular disease of the colon. Electronically Signed   By: Luke Bun M.D.   On: 06/28/2024 14:49   Recent Labs    06/27/24 0440  WBC 7.4  HGB 12.2  HCT 36.4  PLT 242   Recent Labs    06/27/24 0440  NA 137  K 3.8  CL 106  CO2 24  GLUCOSE 152*  BUN 15  CREATININE 0.86  CALCIUM  9.1    Intake/Output Summary (Last 24 hours) at 06/29/2024 1210 Last data filed at 06/29/2024 0800 Gross per 24 hour  Intake 480 ml  Output --  Net 480 ml        Physical Exam: Vital Signs Blood pressure (!) 154/72, pulse 70, temperature 98 F (36.7 C), temperature source Oral, resp. rate 18, height 5' 7 (1.702 m), weight (S) 87.4 kg, SpO2 98%. Gen: no distress, normal appearing HEENT: oral mucosa pink and moist, NCAT Cardio: Reg rate Chest: normal effort, normal rate of breathing Abd: soft, non-distended Ext: no edema Psych: pleasant, normal affect Skin: intact Musculoskeletal:     Cervical back: Neck supple. No tenderness.     Comments: RUE 5/5 thorughout LUE- ~4/5 ina ll muscles except FA which is 2+/5 RLE- 5/5 throughout LLE- 4+ throughout, stable 7/6   Skin:    General: Skin is warm and dry.     Comments: No wounds seen  Neurological:     Mental Status: She is alert.     Comments: Left facial weakness with mild dysarthria. Able to answer orientation questions without difficulty and follow simple one and  two step commands. Left sided weakness.  Knows in [facility] on July 3rd 2025 and why here, however slowed /delayed responses- also knows next holiday is 4th of July- tomorrow.  Intact to light touch in all 4 extremities   Psychiatric:     Comments: Extremely flat    Assessment/Plan: 1. Functional deficits which require 3+ hours per day of interdisciplinary therapy in a comprehensive inpatient rehab setting. Physiatrist is providing close team supervision and 24 hour management of active medical problems listed below. Physiatrist and rehab team continue to assess barriers to discharge/monitor patient progress toward functional and medical goals  Care Tool:  Bathing    Body parts bathed by patient: Left upper leg, Right arm, Left arm, Right lower leg, Chest, Abdomen, Left lower leg, Face, Front perineal area, Right upper leg, Buttocks   Body parts bathed by helper: Buttocks     Bathing assist Assist Level: Minimal Assistance - Patient > 75%     Upper Body Dressing/Undressing Upper body dressing   What is the patient wearing?: Pull over shirt    Upper body assist Assist Level: Set up assist  Lower Body Dressing/Undressing Lower body dressing      What is the patient wearing?: Pants, Underwear/pull up     Lower body assist Assist for lower body dressing: Minimal Assistance - Patient > 75%     Toileting Toileting    Toileting assist       Transfers Chair/bed transfer  Transfers assist     Chair/bed transfer assist level: Minimal Assistance - Patient > 75%     Locomotion Ambulation   Ambulation assist      Assist level: Minimal Assistance - Patient > 75% Assistive device: Rollator (HHA and Rollator min A) Max distance: 115   Walk 10 feet activity   Assist     Assist level: Minimal Assistance - Patient > 75% Assistive device: Hand held assist   Walk 50 feet activity   Assist    Assist level: Minimal Assistance - Patient > 75% Assistive  device: Rollator    Walk 150 feet activity   Assist Walk 150 feet activity did not occur:  (fatigue)         Walk 10 feet on uneven surface  activity   Assist     Assist level: Moderate Assistance - Patient - 50 - 74%     Wheelchair     Assist Is the patient using a wheelchair?: Yes Type of Wheelchair: Manual    Wheelchair assist level: Dependent - Patient 0%      Wheelchair 50 feet with 2 turns activity    Assist        Assist Level: Dependent - Patient 0%   Wheelchair 150 feet activity     Assist      Assist Level: Dependent - Patient 0%   Blood pressure (!) 154/72, pulse 70, temperature 98 F (36.7 C), temperature source Oral, resp. rate 18, height 5' 7 (1.702 m), weight (S) 87.4 kg, SpO2 98%.  Medical Problem List and Plan: 1. Functional deficits secondary to R BG stroke/R MCA stroke             -patient may  shower             -ELOS/Goals: 10-14 days- supervision to mod I- needs PT, OT and SLP             Grounds pass ordered  Vitamin D  started, level is 31  Continue CIR  2.  Antithrombotics: -DVT/anticoagulation:  Pharmaceutical: continue Lovenox              -antiplatelet therapy: DAPT X 3 months followed by ASA  3. Pain Management: tylenol  prn.  4. Mood/Behavior/Sleep: LCSW to follow for evaluation and support.              -antipsychotic agents: N/A 5. Neuropsych/cognition: This patient is capable of making decisions on her own behalf. 6. Skin/Wound Care: Routine pressure relief measures.  7. Fluids/Electrolytes/Nutrition: Monitor I/O. Check CMET in am.  8. E coli UTI: Keflex  antibiotic D#3/7 9. Hypotension fluctuating with hypertension: decrease Toprol  XL to 12.5mg  daily. Avoid hypotension with evidence of intracranial stenosis. Add magnesium  supplement             10.  T2DM: Hgb A1c- 9.7. Monitor BS ac/hs and use SSI for elevated BS. Add CM restrictions to diet.             --Was on metformin  PTA with poor control.  --now  on Insulin  glargline. decrease SSI to sensitive.               --RD consulted  for education on carb modified diet and Rehab RN for insulin  education.   -provide list of foods for diabetes  -CBGs reviewed  -decrease metformin  to 500 BID given emesis after taking medication  11. Dyslipidemia: continue Crestor .   12.  Mild intermittent asthma: continue Singulair   13. Thyroid nodules/enlargement: Ultrasound for work up after discharge.   14. Emesis: KUB reviewed and shows diverticular disease, CT abdomen/pelvis ordered and shows diverticulosis with no acute diverticulitis or other acute abnormalities, weight reviewed and is decreased, offered IVF but patient declines at this time preferring oral hydration only, discussed with nursing that patient was feeling much better in the afternoon  15. Fibroid uterus: refer for outpatient OB/GYN f/u    LOS: 3 days A FACE TO FACE EVALUATION WAS PERFORMED  Nyanna Heideman P Yury Schaus 06/29/2024, 12:10 PM

## 2024-06-29 NOTE — IPOC Note (Signed)
 Overall Plan of Care [facility]) Patient Details Name: Monica Austin MRN: [id number] DOB: 11-26-1956  Admitting Diagnosis: Acute ischemic right MCA stroke [facility])  Hospital Problems: Principal Problem:   Acute ischemic right MCA stroke [facility])     Functional Problem List: Nursing Bladder, Safety, Endurance, Medication Management  PT Balance, Edema, Endurance, Motor, Perception, Safety, Sensory  OT Balance, Safety, Sensory, Motor, Endurance, Other (Comment) (strength)  SLP Cognition, Nutrition, Safety  TR         Basic ADL's: OT Grooming, Bathing, Dressing, Toileting     Advanced  ADL's: OT       Transfers: PT Bed Mobility, Bed to Chair, Customer service manager, Tub/Shower     Locomotion: PT Ambulation, Stairs     Additional Impairments: OT Fuctional Use of Upper Extremity  SLP Swallowing, Social Cognition   Problem Solving, Memory, Attention, Awareness  TR      Anticipated Outcomes Item Anticipated Outcome  Self Feeding    Swallowing  supervision   Basic self-care     Toileting      Bathroom Transfers    Bowel/Bladder  manage bladder w toileting  Transfers  mod I  Locomotion  mod I/supervision  Communication  supervision  Cognition  supervision  Pain  n/a  Safety/Judgment  manage safety w cues   Therapy Plan: PT Intensity: Minimum of 1-2 x/day ,45 to 90 minutes PT Frequency: 5 out of 7 days PT Duration Estimated Length of Stay: 10-14 days OT Intensity: Minimum of 1-2 x/day, 45 to 90 minutes OT Frequency: 5 out of 7 days SLP Intensity: Minumum of 1-2 x/day, 30 to 90 minutes SLP Frequency: 3 to 5 out of 7 days SLP Duration/Estimated Length of Stay: 10-14 days   Team Interventions: Nursing Interventions Patient/Family Education, Dysphagia/Aspiration Precaution Training, Bladder Management, Medication Management, Discharge Planning, Disease Management/Prevention  PT interventions Ambulation/gait training, Community reintegration, DME/adaptive equipment  instruction, Neuromuscular re-education, Psychosocial support, Stair training, UE/LE Strength taining/ROM, Wheelchair propulsion/positioning, Warden/ranger, Discharge planning, Functional electrical stimulation, Pain management, Skin care/wound management, Therapeutic Activities, UE/LE Coordination activities, Cognitive remediation/compensation, Disease management/prevention, Functional mobility training, Patient/family education, Splinting/orthotics, Therapeutic Exercise, Visual/perceptual remediation/compensation  OT Interventions Balance/vestibular training, Discharge planning, Functional electrical stimulation, UE/LE Strength taining/ROM, Psychosocial support, Neuromuscular re-education, DME/adaptive equipment instruction, Community reintegration, Visual/perceptual remediation/compensation, UE/LE Coordination activities, Therapeutic Activities, Self Care/advanced ADL retraining, Cognitive remediation/compensation, Functional mobility training, Disease mangement/prevention, Patient/family education, Therapeutic Exercise  SLP Interventions Cognitive remediation/compensation, Functional tasks, Cueing hierarchy, Internal/external aids, Patient/family education, Medication managment, Speech/Language facilitation, Dysphagia/aspiration precaution training, Therapeutic Activities, Environmental controls  TR Interventions    SW/CM Interventions Discharge Planning, Psychosocial Support, Patient/Family Education   Barriers to Discharge [place]  Medical stability  Nursing Decreased caregiver support, Home environment access/layout, New diabetic 1 leve 6 ste bil rail; house in [place], which her grandson also stays at, but she also goes to [place] for periods of time to work and she stays with her daughter Dena will come and stay with her awhile on FMLA  PT Inaccessible home environment, Decreased caregiver support, Home environment access/layout, Incontinence    OT Decreased caregiver support family  not able to provide 24 hour assist  SLP      SW Insurance for SNF coverage     Team Discharge Planning: Destination: PT-Home ,OT-   , SLP-Home Projected Follow-up: PT-Outpatient PT, OT-   , SLP-Home Health SLP, Outpatient SLP, 24 hour supervision/assistance Projected Equipment Needs: PT-To be determined, OT-  , SLP-None recommended by SLP Equipment Details: PT- , OT-  Patient/family involved in discharge planning: PT- Patient, Family member/caregiver,  OT- , SLP-Patient, Family member/caregiver  [place] ELOS: 10-14 days Medical Rehab Prognosis:  Excellent Assessment: The patient has been admitted for CIR therapies with the diagnosis of right basal ganglia and MCA infarct. The team will be addressing functional mobility, strength, stamina, balance, safety, adaptive techniques and equipment, self-care, bowel and bladder mgt, patient and caregiver education. Goals have been set at supervision. Anticipated discharge destination is home.        See Team Conference Notes for weekly updates to the plan of care

## 2024-06-29 NOTE — Progress Notes (Signed)
 Speech Language Pathology Daily Session Note  Patient Details  Name: Monica Austin MRN: [id number] Date of Birth: 09-26-1956  Today's Date: 06/29/2024 SLP Individual Time: 1510-1533 SLP Individual Time Calculation (min): 23 min  Short Term Goals: Week 1: SLP Short Term Goal 1 (Week 1): Patient will attend to function therapy tasks for 20 minutes given min assist. SLP Short Term Goal 2 (Week 1): Patient will solve mildly complex problems with 80% accuracy given min verbal cues. SLP Short Term Goal 3 (Week 1): Patient will tolerate upgraded solid trials orally with min assist for use of compensatory swallowing strategies. SLP Short Term Goal 4 (Week 1): Patient will indicate awareness of changes to cognitive status by stating 2 cognitive deficits given min assist. SLP Short Term Goal 5 (Week 1): Patient will utilize SLOP speech intelligibility strategies in 80% of opportunities at the sentence level with min assist.  Skilled Therapeutic Interventions:  Pt seen for ST targeting swallowing and cognition goals. Missed 7 minutes of tx due to pt being out of room with family. [name] and daughter present for session. Pt denied pain. NSG reports continued emesis this AM but pt able to eat lunch provided by family (salmon and collard greens) with no reported issues. During PO trials of regular texture snacks in room, pt with prolonged and inefficient mastication with most chewing occurring on R side. No overt s/sx of aspiration observed and no emesis. Provided education that these items are not Dys 3 diet and risks of aspiration/choking following CVA with oral dysphagia. Rec pt remain on Dys 3 at this time due to difficulty with mastication. Pt and family verbalized understanding. SLP then facilitated medication management task. 100% acc for reading comprehension of medication instructions independently (IND). 100% acc IND for locating medication errors and 100% acc IND when asked to provide verbal  rationale of how to correct them. Pt left in bed with family present, call light in reach, and bed alarm active. Continue ST POC.   Pain Pain Assessment Pain Scale: 0-10 Pain Score: 0-No pain  Therapy/Group: Individual Therapy  [name], MA CCC-SLP 06/29/2024, 3:38 PM

## 2024-06-30 DIAGNOSIS — I63511 Cerebral infarction due to unspecified occlusion or stenosis of right middle cerebral artery: Secondary | ICD-10-CM | POA: Diagnosis not present

## 2024-06-30 DIAGNOSIS — R110 Nausea: Secondary | ICD-10-CM | POA: Diagnosis not present

## 2024-06-30 DIAGNOSIS — E119 Type 2 diabetes mellitus without complications: Secondary | ICD-10-CM

## 2024-06-30 DIAGNOSIS — Z794 Long term (current) use of insulin: Secondary | ICD-10-CM

## 2024-06-30 DIAGNOSIS — I10 Essential (primary) hypertension: Secondary | ICD-10-CM

## 2024-06-30 LAB — CBC
HCT: 41.8 % (ref 36.0–46.0)
Hemoglobin: 13.9 g/dL (ref 12.0–15.0)
MCH: 27.6 pg (ref 26.0–34.0)
MCHC: 33.3 g/dL (ref 30.0–36.0)
MCV: 82.9 fL (ref 80.0–100.0)
Platelets: 282 K/uL (ref 150–400)
RBC: 5.04 MIL/uL (ref 3.87–5.11)
RDW: 13.2 % (ref 11.5–15.5)
WBC: 8.3 K/uL (ref 4.0–10.5)
nRBC: 0 % (ref 0.0–0.2)

## 2024-06-30 LAB — BASIC METABOLIC PANEL WITH GFR
Anion gap: 10 (ref 5–15)
BUN: 19 mg/dL (ref 8–23)
CO2: 23 mmol/L (ref 22–32)
Calcium: 9.9 mg/dL (ref 8.9–10.3)
Chloride: 105 mmol/L (ref 98–111)
Creatinine, Ser: 0.97 mg/dL (ref 0.44–1.00)
GFR, Estimated: >60 mL/min (ref >60)
Glucose, Bld: 145 mg/dL — ABNORMAL HIGH (ref 70–99)
Potassium: 4 mmol/L (ref 3.5–5.1)
Sodium: 138 mmol/L (ref 135–145)

## 2024-06-30 LAB — GLUCOSE, CAPILLARY
Glucose-Capillary: 155 mg/dL — ABNORMAL HIGH (ref 70–99)
Glucose-Capillary: 158 mg/dL — ABNORMAL HIGH (ref 70–99)
Glucose-Capillary: 101 mg/dL — ABNORMAL HIGH (ref 70–99)
Glucose-Capillary: 104 mg/dL — ABNORMAL HIGH (ref 70–99)

## 2024-06-30 MED ORDER — PANTOPRAZOLE SODIUM 40 MG PO TBEC
40.0000 mg | DELAYED_RELEASE_TABLET | Freq: Every day | ORAL | Status: DC
  Administered 2024-06-30 – 2024-07-11 (×12): 40 mg via ORAL
  Filled 2024-06-30 (×13): qty 1

## 2024-06-30 NOTE — Progress Notes (Signed)
 Occupational Therapy Session Note  Patient Details  Name: Monica Austin MRN: [id number] Date of Birth: 1956-10-27  Today's Date: 06/30/2024 OT Individual Time: 1120-1205 OT Individual Time Calculation (min): 45 min    Short Term Goals: Week 1:  OT Short Term Goal 1 (Week 1): patient will demonstrate increased transfers completing with CGA following taught strategies to improve balance OT Short Term Goal 2 (Week 1): patient will complete bilateral UE activity with 1-2 verbal cues for placement. OT Short Term Goal 3 (Week 1): patient will complete LB dressing with CGA to increase dynamic sitting and standing balane OT Short Term Goal 4 (Week 1): patient will demonstrate increased safety awareness verbalizing and completing 2 safety strategies with 2 or fewer cues.  Skilled Therapeutic Interventions/Progress Updates:   Patient agreeable to participate in OT session. Reports no pain level.   Patient participated in skilled OT session focusing on bathing, sit to stand safety, and dressing. Patient completed functional mobility CG to min A for safety. Patient able to complete bathing CG with verbal cues. Patient CG to sup for LB dressing set up UB dressing. Min A sock donning, will trial sock aide. Patient completed sit to stands x5 with min verbal cues to increase safety with mobility.     Therapy Documentation Precautions:  Precautions Precautions: Fall Precaution/Restrictions Comments: L hemiparesis Restrictions Weight Bearing Restrictions Per Provider Order: No   Therapy/Group: Individual Therapy  D'mariea L Gotham Raden 06/30/2024, 11:55 AM

## 2024-06-30 NOTE — Progress Notes (Signed)
 Speech Language Pathology Daily Session Note  Patient Details  Name: Monica Austin MRN: [id number] Date of Birth: 12-23-1956  Today's Date: 06/30/2024 SLP Individual Time: 0916-1000 SLP Individual Time Calculation (min): 44 min  Short Term Goals: Week 1: SLP Short Term Goal 1 (Week 1): Patient will attend to function therapy tasks for 20 minutes given min assist. SLP Short Term Goal 2 (Week 1): Patient will solve mildly complex problems with 80% accuracy given min verbal cues. SLP Short Term Goal 3 (Week 1): Patient will tolerate upgraded solid trials orally with min assist for use of compensatory swallowing strategies. SLP Short Term Goal 4 (Week 1): Patient will indicate awareness of changes to cognitive status by stating 2 cognitive deficits given min assist. SLP Short Term Goal 5 (Week 1): Patient will utilize SLOP speech intelligibility strategies in 80% of opportunities at the sentence level with min assist.  Skilled Therapeutic Interventions: Skilled therapy session focused on cognitive goals. SLP facilitated session by targeting problem solving through simple money management and math word problems. Patient completed money management task with supervisionA to count changed according to verbalized amount. SLP continued to challenge patient through functional math word problems. Patient required modA to complete, though likely due to fatigue and occasional bouts of nausea. Patient left in bed with alarm set and call bell in reach. Continue POC.    Pain Denies  Therapy/Group: Individual Therapy Josephus Harriger M.A., CCC-SLP 06/30/2024, 7:39 AM

## 2024-06-30 NOTE — Progress Notes (Signed)
 Physical Therapy Session Note  Patient Details  Name: Monica Austin MRN: [id number] Date of Birth: 1956-10-19  Today's Date: 06/30/2024 PT Individual Time: 1015-1115 + 1400-1430 PT Individual Time Calculation (min): 60 min + 30 min = 90 min  Short Term Goals: Week 1:  PT Short Term Goal 1 (Week 1): pt will ambulate 150 feet with LRAD and CGA PT Short Term Goal 2 (Week 1): pt will perform bed to chair transfer with LRAD and CGA PT Short Term Goal 3 (Week 1): pt will navigate 12 steps with LRAD and CGA  1st Session  Skilled Therapeutic Interventions/Progress Updates: Pt was in the bathroom at entrance to room. Pt is using a rollator currently and will be adjusted to RW to meet her current status. Pt denies pain. Patient alert and agreeable to PT session.   Therapeutic Activity: -Step touching- 3x10 - Pt struggles to keep feet directly under hips, pt tends to put both feet together to reduce muscular demand to stabilize in during step transition. Will get fatigued and begin to not clear step when returning from step to the ground.  -side stepping with red theraband- 3x15 - Pt struggles to take bigger steps laterally to the left. Needs help to begin movements that require her L leg to stabilize.   Gait Training:  Pt ambulated 300 ft using rollator with CGA. Pt demonstrated the following gait deviations with therapist providing the described cuing and facilitation for improvement: trunk flexion gait, looking down at the ground while walking. Pt needed extensive verbal cues to keep the rollator closer to the body, slow down, and to clear her L toe off the ground during swing.    Neuromuscular Re-ed: NMR facilitated during session with focus on weight shifting in stance, single leg stance, dynamic gait - Sharp turns while walking both left and right - Pt tends to walk faster while going around turns and struggles to clear the ground on her L foot when taking left turns.   NMR performed for  improvements in motor control and coordination, balance, sequencing, judgement, and self confidence/ efficacy in performing all aspects of mobility at highest level of independence.    Patient left in bed at end of session with brakes locked, pt requested to have both sides of the bed rails up. Bed alarm set, and all needs within reach.     2nd Session   Pt reporting that her stomach still is bothering her, and would spit in a napkin a few times throughout the session. Pt reported that she wants to get back to bed.   Gait Training:  Pt ambulated 200 ft using RW with CGA. Pt demonstrated the following gait deviations with therapist providing the described cuing and facilitation for improvement: trunk flexion gait, looking down at the ground while walking. Pt performed better with the RW, the pt was able to walk much more upright with more controlled gait speed. Pt still struggled with L turns, but did improve by the end of the session.   Neuromuscular Re-ed: NMR facilitated during session with focus on weight shifting in stance -Wide BOS lateral weight shift 2x10  NMR performed for improvements in motor control and coordination, balance, sequencing, judgement, and self confidence/ efficacy in performing all aspects of mobility at highest level of independence.   Patient left in bed at end of session with brakes locked, pt requested to have both sides of the bed rails up. Bed alarm set, and all needs within reach.   Therapy  Documentation Precautions:  Precautions Precautions: Fall Precaution/Restrictions Comments: L hemiparesis Restrictions Weight Bearing Restrictions Per Provider Order: No     Therapy/Group: Individual Therapy  Kathlynn Swofford 06/30/2024, 11:22 AM

## 2024-06-30 NOTE — Progress Notes (Signed)
 Inpatient Rehabilitation  Patient information reviewed and entered into eRehab system by Cheri Rous, OTR/L, Rehab Quality Coordinator.   Information including medical coding, functional ability and quality indicators will be reviewed and updated through discharge.

## 2024-06-30 NOTE — Progress Notes (Signed)
 PROGRESS NOTE   Subjective/Complaints: Still with nausea. Feels it's worse after she gets meds in the morning. Really hasn't improved or worsened since being here. Is trying to eat. Perhaps a little sore in RLQ  ROS: Patient denies fever, rash, sore throat, blurred vision, dizziness,  vomiting, diarrhea, cough, shortness of breath or chest pain, joint or back/neck pain, headache, or mood change.   Objective:   CT ABDOMEN PELVIS WO CONTRAST Result Date: 06/29/2024 CLINICAL DATA:  Epigastric pain. EXAM: CT ABDOMEN AND PELVIS WITHOUT CONTRAST TECHNIQUE: Multidetector CT imaging of the abdomen and pelvis was performed following the standard protocol without IV contrast. RADIATION DOSE REDUCTION: This exam was performed according to the departmental dose-optimization program which includes automated exposure control, adjustment of the mA and/or kV according to patient size and/or use of iterative reconstruction technique. COMPARISON:  None Available. FINDINGS: Lower chest: Lung bases are clear.  No pleural effusions. Hepatobiliary: No acute abnormality to the liver or gallbladder. No significant biliary dilatation. Pancreas: Unremarkable. No pancreatic ductal dilatation or surrounding inflammatory changes. Spleen: Normal in size without focal abnormality. Adrenals/Urinary Tract: Normal adrenal glands. Low-density structures in the renal sinus of both kidneys. No dilatation of the ureter or renal pelvis bilaterally. Findings are most compatible with renal sinus cysts. Negative for kidney stones or hydronephrosis. Normal urinary bladder. Stomach/Bowel: Normal appearance of stomach. No bowel dilatation. No evidence for a bowel obstruction. Retained barium within the rectum and within colonic diverticula. No evidence for focal bowel inflammation. There appears to be a small normal appendix. Vascular/Lymphatic: No significant vascular findings are present. No  enlarged abdominal or pelvic lymph nodes. Reproductive: Multiple exophytic nodular structures associated with the uterus. Findings are most compatible with multiple uterine fibroids. Limited evaluation of the adnexa structures on this examination without intravascular contrast. Other: Probable small left inguinal hernia containing fat. Negative for free fluid. Negative for free air. Small umbilical hernia containing fat. Musculoskeletal: Joint space narrowing and degenerative changes in both hips. Grade 1 anterolisthesis of L4 on L5 that appears to be secondary to facet arthropathy. IMPRESSION: 1. No acute abnormality in the abdomen or pelvis. 2. Colonic diverticulosis without evidence for diverticulitis. 3. Probable fibroid uterus. Limited evaluation of the uterus and adnexa on this exam. Electronically Signed   By: Juliene Balder M.D.   On: 06/29/2024 13:39   DG Abd 1 View Result Date: 06/28/2024 CLINICAL DATA:  Emesis EXAM: ABDOMEN - 1 VIEW COMPARISON:  None Available. FINDINGS: Nonobstructed gas pattern with contrast in the colon and rectum. Diverticular disease of the colon. No radiopaque calculi IMPRESSION: Nonobstructed gas pattern. Diverticular disease of the colon. Electronically Signed   By: Luke Bun M.D.   On: 06/28/2024 14:49   Recent Labs    06/30/24 0554  WBC 8.3  HGB 13.9  HCT 41.8  PLT 282   Recent Labs    06/30/24 0554  NA 138  K 4.0  CL 105  CO2 23  GLUCOSE 145*  BUN 19  CREATININE 0.97  CALCIUM  9.9    Intake/Output Summary (Last 24 hours) at 06/30/2024 1007 Last data filed at 06/30/2024 9177 Gross per 24 hour  Intake 600 ml  Output --  Net 600 ml        Physical Exam: Vital Signs Blood pressure 128/73, pulse 77, temperature 98 F (36.7 C), resp. rate 18, height 5' 7 (1.702 m), weight 82.2 kg, SpO2 100%. Constitutional: No distress . Vital signs reviewed. HEENT: NCAT, EOMI, oral membranes moist Neck: supple Cardiovascular: RRR without murmur. No JVD     Respiratory/Chest: CTA Bilaterally without wheezes or rales. Normal effort    GI/Abdomen: BS +, sl RLQ tender, non-distended Ext: no clubbing, cyanosis, or edema Psych: flat but pleasant and cooperative  Skin: intact Musculoskeletal:     Cervical back: Neck supple. No tenderness.      Neuro: RUE 5/5 thorughout LUE- ~4/5 ina ll muscles except FA which is 2+/5 RLE- 5/5 throughout LLE- 4+ throughout, stable 7/7, good sitting balance      Mental Status: She is alert.     Comments: Left facial weakness with mild dysarthria. Able to answer orientation questions without difficulty and follow simple one and two step commands. Left sided weakness.  Knows in [facility] on July 3rd 2025 and why here, however slowed /delayed responses- also knows next holiday is 4th of July- tomorrow.  Intact to light touch in all 4 extremities       Assessment/Plan: 1. Functional deficits which require 3+ hours per day of interdisciplinary therapy in a comprehensive inpatient rehab setting. Physiatrist is providing close team supervision and 24 hour management of active medical problems listed below. Physiatrist and rehab team continue to assess barriers to discharge/monitor patient progress toward functional and medical goals  Care Tool:  Bathing    Body parts bathed by patient: Left upper leg, Right arm, Left arm, Right lower leg, Chest, Abdomen, Left lower leg, Face, Front perineal area, Right upper leg, Buttocks   Body parts bathed by helper: Buttocks     Bathing assist Assist Level: Minimal Assistance - Patient > 75%     Upper Body Dressing/Undressing Upper body dressing   What is the patient wearing?: Pull over shirt    Upper body assist Assist Level: Set up assist    Lower Body Dressing/Undressing Lower body dressing      What is the patient wearing?: Pants, Underwear/pull up     Lower body assist Assist for lower body dressing: Minimal Assistance - Patient > 75%      Toileting Toileting    Toileting assist       Transfers Chair/bed transfer  Transfers assist     Chair/bed transfer assist level: Minimal Assistance - Patient > 75%     Locomotion Ambulation   Ambulation assist      Assist level: Minimal Assistance - Patient > 75% Assistive device: Rollator (HHA and Rollator min A) Max distance: 115   Walk 10 feet activity   Assist     Assist level: Minimal Assistance - Patient > 75% Assistive device: Hand held assist   Walk 50 feet activity   Assist    Assist level: Minimal Assistance - Patient > 75% Assistive device: Rollator    Walk 150 feet activity   Assist Walk 150 feet activity did not occur:  (fatigue)         Walk 10 feet on uneven surface  activity   Assist     Assist level: Moderate Assistance - Patient - 50 - 74%     Wheelchair     Assist Is the patient using a wheelchair?: Yes Type of Wheelchair: Manual    Wheelchair assist level: Dependent - Patient  0%      Wheelchair 50 feet with 2 turns activity    Assist        Assist Level: Dependent - Patient 0%   Wheelchair 150 feet activity     Assist      Assist Level: Dependent - Patient 0%   Blood pressure 128/73, pulse 77, temperature 98 F (36.7 C), resp. rate 18, height 5' 7 (1.702 m), weight 82.2 kg, SpO2 100%.  Medical Problem List and Plan: 1. 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  Antithrombotics: -DVT/anticoagulation:  Pharmaceutical: continue Lovenox              -antiplatelet therapy: DAPT X 3 months followed by ASA  3. Pain Management: tylenol  prn.  4. Mood/Behavior/Sleep: LCSW to follow for evaluation and support.              -antipsychotic  agents: N/A 5. Neuropsych/cognition: This patient is capable of making decisions on her own behalf. 6. Skin/Wound Care: Routine pressure relief measures.  7. Fluids/Electrolytes/Nutrition: Monitor I/O. Check CMET in am.  8. E coli UTI: Keflex  antibiotic D#3/7 9. Hypotension fluctuating with hypertension: decrease Toprol  XL to 12.5mg  daily. Avoid hypotension with evidence of intracranial stenosis. Add magnesium  supplement            7/7 bp controlled 10.  T2DM: Hgb A1c- 9.7. Monitor BS ac/hs and use SSI for elevated BS. Add CM restrictions to diet.             --Was on metformin  PTA with poor control.  --now on Insulin  glargline. decrease SSI to sensitive.               --RD consulted for education on carb modified diet and Rehab RN for insulin  education.   -provide list of foods for diabetes  -CBGs reviewed  7/7 -stop metformin  d/t nausea--observe cbg's without     CBG (last 3)  Recent Labs    06/29/24 1701 06/29/24 2126 06/30/24 0555  GLUCAP 127* 138* 155*    11. Dyslipidemia: continue Crestor .   12.  Mild intermittent asthma: continue Singulair   13. Thyroid nodules/enlargement: Ultrasound for work up after discharge.   14. Emesis: KUB reviewed and shows diverticular disease, CT abdomen/pelvis ordered and shows diverticulosis with no acute diverticulitis or other acute abnormalities  -doubt nausea related to above  7/7 -likely metformin --stop this and observe   -push fluids 15. Fibroid uterus: refer for outpatient OB/GYN f/u    LOS: 4 days A FACE TO FACE EVALUATION WAS PERFORMED  Monica Austin 06/30/2024, 10:07 AM

## 2024-06-30 NOTE — Progress Notes (Signed)
 Occupational Therapy Session Note  Patient Details  Name: Monica Austin MRN: [id number] Date of Birth: 06/11/1956  Today's Date: 06/30/2024 OT Individual Time: 1303-1400 OT Individual Time Calculation (min): 57 min    Short Term Goals: Week 1:  OT Short Term Goal 1 (Week 1): patient will demonstrate increased transfers completing with CGA following taught strategies to improve balance OT Short Term Goal 2 (Week 1): patient will complete bilateral UE activity with 1-2 verbal cues for placement. OT Short Term Goal 3 (Week 1): patient will complete LB dressing with CGA to increase dynamic sitting and standing balane OT Short Term Goal 4 (Week 1): patient will demonstrate increased safety awareness verbalizing and completing 2 safety strategies with 2 or fewer cues.  Skilled Therapeutic Interventions/Progress Updates:    Pt received in recliner and agreeable to therapy. Pt did not have a bra on or have one available.  Due to thin nature of her shirt pt agreeable to trying out a homemade bra using the mesh underwear. Pt doffed and donned shirt with no assist and min A with pullover bra.  Pt used 4WW to walk to bathroom to toilet.  Mod cues for positioning of walker to safely turn and to pick up L foot as she was catching her toe frequently.  Stood at sink to wash hands with cues for safer foot alignment as she stood with feet close together and under R hip vs a more supportive stance.  Used 4WW to ambulate to main gym with cues to attend to L foot.   In gym focused on L side coordination: -plantar and dorsiflexion with pressing into inflated disc (to simulate gas pedal) -circle foot in B directions on floor with cloth under foot - L sh flexion A/arom with single arm reach,  B hands on 2 lb dowel bar and standing wall slides -pt described L arm feeling tight, did open chest stretches followed by bicep massage -L hand coordination to simulate pouring water into cup - L hand stacking cones with  min imp coordination for smooth fast movement  Discussed with pt that she has fair strength and coordination but needs to mentally attend to L side more to improve functional coordination with ADLs.  Hand off to PT for next session.  Therapy Documentation Precautions:  Precautions Precautions: Fall Precaution/Restrictions Comments: L hemiparesis Restrictions Weight Bearing Restrictions Per Provider Order: No    Vital Signs: Therapy Vitals Temp: 98.4 F (36.9 C) Pulse Rate: 80 Resp: 19 BP: 139/73 Patient Position (if appropriate): Sitting Oxygen Therapy SpO2: 99 % O2 Device: Room Air Pain: Pain Assessment Pain Score: 0-No pain    Therapy/Group: Individual Therapy  Carianna Lague 06/30/2024, 2:41 PM

## 2024-06-30 NOTE — Progress Notes (Signed)
 Nutrition Education Note  RD consulted for nutrition education regarding stroke and diabetes. Met with patient in her room. She reports that she is eating well. She does not follow a certain diet at home. She asked appropriate questions.   Lab Results  Component Value Date   HGBA1C 9.7 (H) 06/23/2024     RD provided Heart Healthy, Consistent Carbohydrate Nutrition Therapy handout from the [employer]. Reviewed patient's dietary recall. Provided examples on ways to decrease sodium intake in diet. Discouraged intake of processed foods and use of salt shaker. Encouraged fresh fruits and vegetables as well as whole grain sources of carbohydrates to maximize fiber intake.   Discussed different food groups and their effects on blood sugar, emphasizing carbohydrate-containing foods. Provided list of carbohydrates and recommended serving sizes of common foods.  Discussed importance of controlled and consistent carbohydrate intake throughout the day. Provided examples of ways to balance meals/snacks and encouraged intake of high-fiber, whole grain complex carbohydrates. Teach back method used.  Expect good compliance.  Body mass index is 28.38 kg/m. Pt meets criteria for overweight based on current BMI.  Current diet order is dysphagia 3 with thin liquids, patient is consuming approximately 50-100% of meals at this time. Labs and medications reviewed. No further nutrition interventions warranted at this time. RD contact information provided. If additional nutrition issues arise, please re-consult RD.    Suzen HUNT RD, LDN, CNSC Contact via secure chat. If unavailable, use group chat RD Inpatient.

## 2024-07-01 DIAGNOSIS — I10 Essential (primary) hypertension: Secondary | ICD-10-CM | POA: Diagnosis not present

## 2024-07-01 DIAGNOSIS — I63511 Cerebral infarction due to unspecified occlusion or stenosis of right middle cerebral artery: Secondary | ICD-10-CM | POA: Diagnosis not present

## 2024-07-01 DIAGNOSIS — E119 Type 2 diabetes mellitus without complications: Secondary | ICD-10-CM | POA: Diagnosis not present

## 2024-07-01 DIAGNOSIS — R110 Nausea: Secondary | ICD-10-CM | POA: Diagnosis not present

## 2024-07-01 LAB — GLUCOSE, CAPILLARY
Glucose-Capillary: 107 mg/dL — ABNORMAL HIGH (ref 70–99)
Glucose-Capillary: 134 mg/dL — ABNORMAL HIGH (ref 70–99)
Glucose-Capillary: 162 mg/dL — ABNORMAL HIGH (ref 70–99)
Glucose-Capillary: 175 mg/dL — ABNORMAL HIGH (ref 70–99)

## 2024-07-01 NOTE — Progress Notes (Signed)
 Occupational Therapy Session Note  Patient Details  Name: Monica Austin MRN: [id number] Date of Birth: 12-07-1956  Today's Date: 07/01/2024 OT Individual Time: 0920-1030 OT Individual Time Calculation (min): 70 min    Short Term Goals: Week 1:  OT Short Term Goal 1 (Week 1): patient will demonstrate increased transfers completing with CGA following taught strategies to improve balance OT Short Term Goal 2 (Week 1): patient will complete bilateral UE activity with 1-2 verbal cues for placement. OT Short Term Goal 3 (Week 1): patient will complete LB dressing with CGA to increase dynamic sitting and standing balane OT Short Term Goal 4 (Week 1): patient will demonstrate increased safety awareness verbalizing and completing 2 safety strategies with 2 or fewer cues.  Skilled Therapeutic Interventions/Progress Updates:  Pt greeted taking a shower, son present but outside of bathroom. Time dedicated education both patient/son on safety policies, including needing to have either a therapist/NT present for bathing. Remainder of session focused on ADL retraining.   Pain: Pt with no reports of pain, OT offering intermediate rest breaks and positioning suggestions throughout session to address pain/fatigue and maximize participation/safety in session.   Functional Transfers: Sit<>stand with CGA + max verbal cuing for controlled descent onto surfaces. Ambulatory room-level transfers and from room<>ortho gym in similar fashion with mod-max verbal cuing for attention to LLE and decreasing speed/impulsivity of ambulation.  Self Care Tasks: Pt completes the following self care tasks with levels of assistance noted below, Bathing/dressing with close supervision, cuing for attention to individual body parts and thoroughness of rinse. Education provided on hemi-dressing techniques.  Therapeutic Activities: With use of BITs modality, pt engaged in activities targeting initiation, visual tracking/attention,  working memory, and reaction times. Pt requires mod levels of verbal cuing for attention to L visual field and averages ~2-4 secs of reaction time. Pt demos greater difficulty with sustained attention when utilizing LUE for activities.  Education: Time dedicated to educating son on D3 diet, handout provided.   Of note, patient observed to be spitting clear phelm multiple times during session. Requested suction from LPN for in room use.   Pt remained sitting in recliner with 4Ps assessed and immediate needs met. Pt continues to be appropriate for skilled OT intervention to promote further functional independence in ADLs/IADLs.   Therapy Documentation Precautions:  Precautions Precautions: Fall Precaution/Restrictions Comments: L hemiparesis Restrictions Weight Bearing Restrictions Per Provider Order: No   Therapy/Group: Individual Therapy  Nereida Habermann, OTR/L, MSOT  07/01/2024, 5:38 AM

## 2024-07-01 NOTE — Progress Notes (Signed)
 PROGRESS NOTE   Subjective/Complaints: Nausea improved. Eating peanut butter crackers in bed. No complaints this morning. Asked when she was going home  ROS: Patient denies fever, rash, sore throat, blurred vision, dizziness, nausea, vomiting, diarrhea, cough, shortness of breath or chest pain, joint or back/neck pain, headache, or mood change.   Objective:   CT ABDOMEN PELVIS WO CONTRAST Result Date: 06/29/2024 CLINICAL DATA:  Epigastric pain. EXAM: CT ABDOMEN AND PELVIS WITHOUT CONTRAST TECHNIQUE: Multidetector CT imaging of the abdomen and pelvis was performed following the standard protocol without IV contrast. RADIATION DOSE REDUCTION: This exam was performed according to the departmental dose-optimization program which includes automated exposure control, adjustment of the mA and/or kV according to patient size and/or use of iterative reconstruction technique. COMPARISON:  None Available. FINDINGS: Lower chest: Lung bases are clear.  No pleural effusions. Hepatobiliary: No acute abnormality to the liver or gallbladder. No significant biliary dilatation. Pancreas: Unremarkable. No pancreatic ductal dilatation or surrounding inflammatory changes. Spleen: Normal in size without focal abnormality. Adrenals/Urinary Tract: Normal adrenal glands. Low-density structures in the renal sinus of both kidneys. No dilatation of the ureter or renal pelvis bilaterally. Findings are most compatible with renal sinus cysts. Negative for kidney stones or hydronephrosis. Normal urinary bladder. Stomach/Bowel: Normal appearance of stomach. No bowel dilatation. No evidence for a bowel obstruction. Retained barium within the rectum and within colonic diverticula. No evidence for focal bowel inflammation. There appears to be a small normal appendix. Vascular/Lymphatic: No significant vascular findings are present. No enlarged abdominal or pelvic lymph nodes.  Reproductive: Multiple exophytic nodular structures associated with the uterus. Findings are most compatible with multiple uterine fibroids. Limited evaluation of the adnexa structures on this examination without intravascular contrast. Other: Probable small left inguinal hernia containing fat. Negative for free fluid. Negative for free air. Small umbilical hernia containing fat. Musculoskeletal: Joint space narrowing and degenerative changes in both hips. Grade 1 anterolisthesis of L4 on L5 that appears to be secondary to facet arthropathy. IMPRESSION: 1. No acute abnormality in the abdomen or pelvis. 2. Colonic diverticulosis without evidence for diverticulitis. 3. Probable fibroid uterus. Limited evaluation of the uterus and adnexa on this exam. Electronically Signed   By: Juliene Balder M.D.   On: 06/29/2024 13:39   Recent Labs    06/30/24 0554  WBC 8.3  HGB 13.9  HCT 41.8  PLT 282   Recent Labs    06/30/24 0554  NA 138  K 4.0  CL 105  CO2 23  GLUCOSE 145*  BUN 19  CREATININE 0.97  CALCIUM  9.9    Intake/Output Summary (Last 24 hours) at 07/01/2024 0858 Last data filed at 06/30/2024 1853 Gross per 24 hour  Intake 120 ml  Output --  Net 120 ml        Physical Exam: Vital Signs Blood pressure 123/73, pulse 67, temperature 98.4 F (36.9 C), temperature source Oral, resp. rate 16, height 5' 7 (1.702 m), weight 82.2 kg, SpO2 96%. Constitutional: No distress . Vital signs reviewed. HEENT: NCAT, EOMI, oral membranes moist Neck: supple Cardiovascular: RRR without murmur. No JVD    Respiratory/Chest: CTA Bilaterally without wheezes or rales. Normal effort  GI/Abdomen: BS +, non-tender, non-distended Ext: no clubbing, cyanosis, or edema Psych: flat but  cooperative  Skin: intact Musculoskeletal:     Cervical back: Neck supple. No tenderness.      Neuro: RUE 5/5 thorughout LUE- ~4/5 ina ll muscles except FA which is 2+/5 RLE- 5/5 throughout LLE- 4+ throughout, stable 7/8       Mental Status: She is alert.     Comments: Left facial weakness with mild dysarthria. Able to answer orientation questions without difficulty and follow simple one and two step commands. Left sided weakness.  Knows in [facility]. Fair insight.   Intact to light touch in all 4 extremities       Assessment/Plan: 1. Functional deficits which require 3+ hours per day of interdisciplinary therapy in a comprehensive inpatient rehab setting. Physiatrist is providing close team supervision and 24 hour management of active medical problems listed below. Physiatrist and rehab team continue to assess barriers to discharge/monitor patient progress toward functional and medical goals  Care Tool:  Bathing    Body parts bathed by patient: Left upper leg, Right arm, Left arm, Right lower leg, Chest, Abdomen, Left lower leg, Face, Front perineal area, Right upper leg, Buttocks   Body parts bathed by helper: Buttocks     Bathing assist Assist Level: Minimal Assistance - Patient > 75%     Upper Body Dressing/Undressing Upper body dressing   What is the patient wearing?: Pull over shirt    Upper body assist Assist Level: Set up assist    Lower Body Dressing/Undressing Lower body dressing      What is the patient wearing?: Pants, Underwear/pull up     Lower body assist Assist for lower body dressing: Minimal Assistance - Patient > 75%     Toileting Toileting    Toileting assist Assist for toileting: Moderate Assistance - Patient 50 - 74%     Transfers Chair/bed transfer  Transfers assist     Chair/bed transfer assist level: Minimal Assistance - Patient > 75%     Locomotion Ambulation   Ambulation assist      Assist level: Minimal Assistance - Patient > 75% Assistive device: Rollator (HHA and Rollator min A) Max distance: 115   Walk 10 feet activity   Assist     Assist level: Minimal Assistance - Patient > 75% Assistive device: Hand held assist   Walk 50 feet  activity   Assist    Assist level: Minimal Assistance - Patient > 75% Assistive device: Rollator    Walk 150 feet activity   Assist Walk 150 feet activity did not occur:  (fatigue)         Walk 10 feet on uneven surface  activity   Assist     Assist level: Moderate Assistance - Patient - 50 - 74%     Wheelchair     Assist Is the patient using a wheelchair?: Yes Type of Wheelchair: Manual    Wheelchair assist level: Dependent - Patient 0%      Wheelchair 50 feet with 2 turns activity    Assist        Assist Level: Dependent - Patient 0%   Wheelchair 150 feet activity     Assist      Assist Level: Dependent - Patient 0%   Blood pressure 123/73, pulse 67, temperature 98.4 F (36.9 C), temperature source Oral, resp. rate 16, height 5' 7 (1.702 m), weight 82.2 kg, SpO2 96%.  Medical Problem List and Plan: 1. 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  Antithrombotics: -DVT/anticoagulation:  Pharmaceutical: continue Lovenox              -antiplatelet therapy: DAPT X 3 months followed by ASA  3. Pain Management: tylenol  prn.  4. Mood/Behavior/Sleep: LCSW to follow for evaluation and support.              -antipsychotic agents: N/A 5. Neuropsych/cognition: This patient is capable of making decisions on her own behalf. 6. Skin/Wound Care: Routine pressure relief measures.  7. Fluids/Electrolytes/Nutrition: Monitor I/O. Check CMET in am.  8. E coli UTI: Keflex  antibiotic D#3/7 9. Hypotension fluctuating with hypertension: decrease Toprol  XL to 12.5mg  daily. Avoid hypotension with evidence of intracranial stenosis. Add magnesium  supplement            7/8 bp controlled 10.  T2DM: Hgb A1c- 9.7.  Monitor BS ac/hs and use SSI for elevated BS. Add CM restrictions to diet.             --Was on metformin  PTA with poor control.  --now on Insulin  glargline. decrease SSI to sensitive.               --RD consulted for education on carb modified diet and Rehab RN for insulin  education.   -provide list of foods for diabetes  -CBGs reviewed  7/8--stopped metformin  d/t nausea--cbg's reasonable so far     CBG (last 3)  Recent Labs    06/30/24 1626 06/30/24 2242 07/01/24 0618  GLUCAP 101* 104* 107*    11. Dyslipidemia: continue Crestor .   12.  Mild intermittent asthma: continue Singulair   13. Thyroid nodules/enlargement: Ultrasound for work up after discharge.   14. Nausea: KUB reviewed and shows diverticular disease, CT abdomen/pelvis ordered and shows diverticulosis with no acute diverticulitis or other acute abnormalities  -doubt nausea related to above  7/8 already improved off metformin .    -push po/fluids.  15. Fibroid uterus: refer for outpatient OB/GYN f/u    LOS: 5 days A FACE TO FACE EVALUATION WAS PERFORMED  Arthea ONEIDA Gunther 07/01/2024, 8:58 AM

## 2024-07-01 NOTE — Progress Notes (Signed)
 Speech Language Pathology Daily Session Note  Patient Details  Name: Monica Austin MRN: [id number] Date of Birth: Jun 20, 1956  Today's Date: 07/01/2024 SLP Individual Time: 8666-8572 SLP Individual Time Calculation (min): 54 min  Short Term Goals: Week 1: SLP Short Term Goal 1 (Week 1): Patient will attend to function therapy tasks for 20 minutes given min assist. SLP Short Term Goal 2 (Week 1): Patient will solve mildly complex problems with 80% accuracy given min verbal cues. SLP Short Term Goal 3 (Week 1): Patient will tolerate upgraded solid trials orally with min assist for use of compensatory swallowing strategies. SLP Short Term Goal 4 (Week 1): Patient will indicate awareness of changes to cognitive status by stating 2 cognitive deficits given min assist. SLP Short Term Goal 5 (Week 1): Patient will utilize SLOP speech intelligibility strategies in 80% of opportunities at the sentence level with min assist.  Skilled Therapeutic Interventions: SLP conducted skilled therapy session targeting cognitive goals. SLP facilitated pattern recognition task targeting thought organization. Patient benefited from min assist for reasoning when selecting accurate next pattern items. Patient then completed sustained attention to detail task where she followed specific directions via scanning and discerning various word characteristics. Patient benefited from min assist throughout for thoroughness and accurate instruction interpretation. Throughout session, patient appeared fixated on oral suction which served as distraction throughout. She seemingly brought suction up to her mouth without emergent need to frequently, applying suction to tongue rather than using it for mucus removal. Encouraged patient to continue swallowing her saliva to promote independent secretion management, as oral suction will not be available upon discharge. In remaining minutes of session, patient completed 5- and 6-step sequencing  task. She sequenced items with min assist and demonstrated emerging self-correction skills. Patient verbalized understanding but question carryover. Patient was left in room with call bell in reach and alarm set. SLP will continue to target goals per plan of care.        Pain  None endorsed  Therapy/Group: Individual Therapy  Vana Arif, M.A., CCC-SLP  Zander Ingham A Yania Bogie 07/01/2024, 2:28 PM

## 2024-07-01 NOTE — Progress Notes (Signed)
 Physical Therapy Session Note  Patient Details  Name: Monica Austin MRN: [id number] Date of Birth: [birth year]  Today's Date: 07/01/2024 PT Individual Time: 1100-1200 PT Individual Time Calculation (min): 60 min   Short Term Goals: Week 1:  PT Short Term Goal 1 (Week 1): pt will ambulate 150 feet with LRAD and CGA PT Short Term Goal 2 (Week 1): pt will perform bed to chair transfer with LRAD and CGA PT Short Term Goal 3 (Week 1): pt will navigate 12 steps with LRAD and CGA  Skilled Therapeutic Interventions/Progress Updates: Patient was seated with son on entrance to room. Patient alert and agreeable to PT session.   Patient reported that she wanted us  to train her son and sign off on him being able to help her around the room.    Gait Training:  Pt ambulated ~250 ft using RW with CGA. Pt demonstrated the following gait deviations with therapist providing the described cuing and facilitation for improvement: standing upright, keeping walker closer to the body, slowing down for turns, and looking in the direction she is moving.   Neuromuscular Re-ed: NMR facilitated during session with focus on standing balance and awareness, foot placement and foot clearance, posture while walking.  - Hurdle box step -3x12 - feet stagger stance with back leg working on swing phase mechanics -Walking with targets to step (training step width) in parallel bars - pt. Performed these well with red theraband around knees to reinforce step width -Single Hip abduction with posture correction 2x10 -Hurdle stepping with RW -4x10 -360 degree turns in place with RW 2x5   NMR performed for improvements in motor control and coordination, balance, sequencing, judgement, and self confidence/ efficacy in performing all aspects of mobility at highest level of independence.     Patient walked back to her room with her son guarding with gait belt as part of his training... Pt left in bed at end of session with  brakes locked and son present.   Therapy Documentation Precautions:  Precautions Precautions: Fall Precaution/Restrictions Comments: L hemiparesis Restrictions Weight Bearing Restrictions Per Provider Order: No     Therapy/Group: Individual Therapy  [name] 07/01/2024, 12:09 PM

## 2024-07-02 DIAGNOSIS — E119 Type 2 diabetes mellitus without complications: Secondary | ICD-10-CM | POA: Diagnosis not present

## 2024-07-02 DIAGNOSIS — I10 Essential (primary) hypertension: Secondary | ICD-10-CM | POA: Diagnosis not present

## 2024-07-02 DIAGNOSIS — R110 Nausea: Secondary | ICD-10-CM | POA: Diagnosis not present

## 2024-07-02 DIAGNOSIS — I63511 Cerebral infarction due to unspecified occlusion or stenosis of right middle cerebral artery: Secondary | ICD-10-CM | POA: Diagnosis not present

## 2024-07-02 LAB — GLUCOSE, CAPILLARY
Glucose-Capillary: 174 mg/dL — ABNORMAL HIGH (ref 70–99)
Glucose-Capillary: 202 mg/dL — ABNORMAL HIGH (ref 70–99)
Glucose-Capillary: 170 mg/dL — ABNORMAL HIGH (ref 70–99)
Glucose-Capillary: 211 mg/dL — ABNORMAL HIGH (ref 70–99)

## 2024-07-02 MED ORDER — GLIPIZIDE ER 2.5 MG PO TB24
2.5000 mg | ORAL_TABLET | Freq: Every day | ORAL | Status: DC
  Administered 2024-07-02 – 2024-07-03 (×2): 2.5 mg via ORAL
  Filled 2024-07-02 (×2): qty 1

## 2024-07-02 NOTE — Progress Notes (Signed)
 PROGRESS NOTE   Subjective/Complaints: In bed eating breakfast. No further nausea. In good spirits. Anxious to get home!  ROS: Patient denies fever, rash, sore throat, blurred vision, dizziness, nausea, vomiting, diarrhea, cough, shortness of breath or chest pain, joint or back/neck pain, headache, or mood change.   Objective:   No results found.  Recent Labs    06/30/24 0554  WBC 8.3  HGB 13.9  HCT 41.8  PLT 282   Recent Labs    06/30/24 0554  NA 138  K 4.0  CL 105  CO2 23  GLUCOSE 145*  BUN 19  CREATININE 0.97  CALCIUM  9.9    Intake/Output Summary (Last 24 hours) at 07/02/2024 0840 Last data filed at 07/02/2024 0830 Gross per 24 hour  Intake 420 ml  Output --  Net 420 ml        Physical Exam: Vital Signs Blood pressure 138/77, pulse 73, temperature 97.6 F (36.4 C), temperature source Oral, resp. rate 16, height 5' 7 (1.702 m), weight 82.2 kg, SpO2 95%. Constitutional: No distress . Vital signs reviewed. HEENT: NCAT, EOMI, oral membranes moist Neck: supple Cardiovascular: RRR without murmur. No JVD    Respiratory/Chest: CTA Bilaterally without wheezes or rales. Normal effort    GI/Abdomen: BS +, non-tender, non-distended Ext: no clubbing, cyanosis, or edema Psych: flat but pleasant and cooperative, more dynamic today! Skin: intact Musculoskeletal:     Cervical back: Neck supple. No tenderness.      Neuro: RUE 5/5 thorughout LUE- ~4/5 ina ll muscles except FA which is 2+/5 RLE- 5/5 throughout LLE- 4+ throughout, stable 7/8      Mental Status: She is alert.     Comments: Left facial weakness with mild dysarthria. Able to answer orientation questions without difficulty and follow simple one and two step commands. Left sided weakness.  Fair insight and awareness. Oriented to hospital, month, why she's here   Intact to light touch in all 4 extremities       Assessment/Plan: 1. Functional  deficits which require 3+ hours per day of interdisciplinary therapy in a comprehensive inpatient rehab setting. Physiatrist is providing close team supervision and 24 hour management of active medical problems listed below. Physiatrist and rehab team continue to assess barriers to discharge/monitor patient progress toward functional and medical goals  Care Tool:  Bathing    Body parts bathed by patient: Left upper leg, Right arm, Left arm, Right lower leg, Chest, Abdomen, Left lower leg, Face, Front perineal area, Right upper leg, Buttocks   Body parts bathed by helper: Buttocks     Bathing assist Assist Level: Minimal Assistance - Patient > 75%     Upper Body Dressing/Undressing Upper body dressing   What is the patient wearing?: Pull over shirt    Upper body assist Assist Level: Set up assist    Lower Body Dressing/Undressing Lower body dressing      What is the patient wearing?: Pants, Underwear/pull up     Lower body assist Assist for lower body dressing: Minimal Assistance - Patient > 75%     Toileting Toileting    Toileting assist Assist for toileting: Moderate Assistance - Patient 50 - 74%  Transfers Chair/bed transfer  Transfers assist     Chair/bed transfer assist level: Minimal Assistance - Patient > 75%     Locomotion Ambulation   Ambulation assist      Assist level: Minimal Assistance - Patient > 75% Assistive device: Rollator (HHA and Rollator min A) Max distance: 115   Walk 10 feet activity   Assist     Assist level: Minimal Assistance - Patient > 75% Assistive device: Hand held assist   Walk 50 feet activity   Assist    Assist level: Minimal Assistance - Patient > 75% Assistive device: Rollator    Walk 150 feet activity   Assist Walk 150 feet activity did not occur:  (fatigue)         Walk 10 feet on uneven surface  activity   Assist     Assist level: Moderate Assistance - Patient - 50 - 74%      Wheelchair     Assist Is the patient using a wheelchair?: Yes Type of Wheelchair: Manual    Wheelchair assist level: Dependent - Patient 0%      Wheelchair 50 feet with 2 turns activity    Assist        Assist Level: Dependent - Patient 0%   Wheelchair 150 feet activity     Assist      Assist Level: Dependent - Patient 0%   Blood pressure 138/77, pulse 73, temperature 97.6 F (36.4 C), temperature source Oral, resp. rate 16, height 5' 7 (1.702 m), weight 82.2 kg, SpO2 95%.  Medical Problem List and Plan: 1. Functional deficits secondary to R BG stroke/R MCA stroke             -patient may  shower             -ELOS/Goals: 10-14 days- supervision to mod I- needs PT, OT and SLP             Grounds pass ordered  Vitamin D  started, level is 31  -Continue CIR therapies including PT, OT, and SLP. Interdisciplinary team conference today to discuss goals, barriers to discharge, and dc planning.    2.  Antithrombotics: -DVT/anticoagulation:  Pharmaceutical: continue Lovenox              -antiplatelet therapy: DAPT X 3 months followed by ASA  3. Pain Management: tylenol  prn.  4. Mood/Behavior/Sleep: LCSW to follow for evaluation and support.              -antipsychotic agents: N/A 5. Neuropsych/cognition: This patient is capable of making decisions on her own behalf. 6. Skin/Wound Care: Routine pressure relief measures.  7. Fluids/Electrolytes/Nutrition: Monitor I/O. Labs tomorrow  8. E coli UTI: Keflex  antibiotic D#3/7 9. Hypotension fluctuating with hypertension: decrease Toprol  XL to 12.5mg  daily. Avoid hypotension with evidence of intracranial stenosis. Add magnesium  supplement            7/9 bp controlled 10.  T2DM: Hgb A1c- 9.7. Monitor BS ac/hs and use SSI for elevated BS. Add CM restrictions to diet.             --Was on metformin  PTA with poor control.  --now on Insulin  glargline. decrease SSI to sensitive.               --RD consulted for education on  carb modified diet and Rehab RN for insulin  education.   -provide list of foods for diabetes  -CBGs reviewed  7/9--stopped metformin  d/t nausea--cbg's increasing a bit now that she's eating  more as well.    -begin low dose glucotrol  and observe   -continue same semglee      CBG (last 3)  Recent Labs    07/01/24 1656 07/01/24 2053 07/02/24 0633  GLUCAP 162* 175* 174*    11. Dyslipidemia: continue Crestor .   12.  Mild intermittent asthma: continue Singulair   13. Thyroid nodules/enlargement: Ultrasound for work up after discharge.   14. Nausea: KUB reviewed and shows diverticular disease, CT abdomen/pelvis ordered and shows diverticulosis with no acute diverticulitis or other acute abnormalities  -doubt nausea related to above  7/9 resolved   -push po/fluids.  15. Fibroid uterus: refer for outpatient OB/GYN f/u    LOS: 6 days A FACE TO FACE EVALUATION WAS PERFORMED  Arthea ONEIDA Gunther 07/02/2024, 8:40 AM

## 2024-07-02 NOTE — Progress Notes (Signed)
 Physical Therapy Session Note  Patient Details  Name: Monica Austin MRN: [id number] Date of Birth: 1956/01/29  Today's Date: 07/02/2024 PT Individual Time: 1030-1100 PT Individual Time Calculation (min): 30 min   Short Term Goals: Week 1:  PT Short Term Goal 1 (Week 1): pt will ambulate 150 feet with LRAD and CGA PT Short Term Goal 2 (Week 1): pt will perform bed to chair transfer with LRAD and CGA PT Short Term Goal 3 (Week 1): pt will navigate 12 steps with LRAD and CGA  Skilled Therapeutic Interventions/Progress Updates:  Patient lying in bed on entrance to room. Patient alert and agreeable to PT session.   Patient reported that she is doing well and wants to go outside to get some fresh air. During today's session pt went outside focusing on safety awareness and gait technique.   Therapeutic Activity: Bed Mobility - Supervision Transfers: -CGA  Gait Training:  Pt ambulated 300 ft using RW with CGA to minA for navigating obstacles. Pt demonstrated the following gait deviations with therapist providing the described cuing and facilitation for improvement: Pt struggles right now to navigate the RW over thresholds to get into elevators and through doors. Pt has the tendency to push the RW too far out in front that can cause her to trip if she leans on too heavy on the handles. Pt needs extensive cues to stand erect and to bring the RW closer to her BOS.    Patient left in recliner with chair alarm set and on at end of session with brakes locked,  all needs within reach.      Therapy Documentation Precautions:  Precautions Precautions: Fall Precaution/Restrictions Comments: L hemiparesis Restrictions Weight Bearing Restrictions Per Provider Order: No      Therapy/Group: Individual Therapy  Marlaine Arey 07/02/2024, 11:35 AM

## 2024-07-02 NOTE — Progress Notes (Signed)
 Patient ID: Monica Austin, female   DOB: May 12, 1956, 68 y.o.   MRN: [id number]  Met with pt and spoke with daughter-Brianica via telephone to discuss team conference update goals of supervision due to safety and need for cues. Pt wants to be independent and explained to both she will need supervision at home-someone there. Aware target discharge date is 7/18. Explained to daughter if she can come day before discharge can go through therapies with Mom and see what she can do for her self and what she needs assistance with. She will let worker know when can be here.

## 2024-07-02 NOTE — Progress Notes (Signed)
 Speech Language Pathology Daily Session Note  Patient Details  Name: ANNET MANUKYAN MRN: [id number] Date of Birth: [birth year]  Today's Date: 07/02/2024 SLP Individual Time: 8594-8541 SLP Individual Time Calculation (min): 53 min  Short Term Goals: Week 1: SLP Short Term Goal 1 (Week 1): Patient will attend to function therapy tasks for 20 minutes given min assist. SLP Short Term Goal 2 (Week 1): Patient will solve mildly complex problems with 80% accuracy given min verbal cues. SLP Short Term Goal 3 (Week 1): Patient will tolerate upgraded solid trials orally with min assist for use of compensatory swallowing strategies. SLP Short Term Goal 4 (Week 1): Patient will indicate awareness of changes to cognitive status by stating 2 cognitive deficits given min assist. SLP Short Term Goal 5 (Week 1): Patient will utilize SLOP speech intelligibility strategies in 80% of opportunities at the sentence level with min assist.  Skilled Therapeutic Interventions: SLP conducted skilled therapy session targeting cognitive goals. SLP facilitated mildly complex scanning and sequencing letter task. Patient benefited from min to mod cues to slow rate and for working memory of task instructions. SLP then facilitated cord sorting task beginning with basic complexity and increasing to mild complexity. With increase in complexity, patient benefited from min cues for accuracy and to slow rate to promote thoroughness. SLP reviewed patient's current medication list. Patient required complete education re: names and purposes of each medication, as this is first time they have been reviewed. Handout provided to promote carryover. In remaining minutes of session, SLP facilitated finance organization task where patient was tasked with chronologically sequencing transactions. Patient benefited from min assist to follow table format and mod assist for accurate thought organization. Patient was left in room with call bell in reach  and alarm set. SLP will continue to target goals per plan of care.        Pain  None endorsed  Therapy/Group: Individual Therapy  Lezley Bedgood, M.A., CCC-SLP  Kaylum Shrum A Hoy Fallert 07/02/2024, 3:01 PM

## 2024-07-02 NOTE — Progress Notes (Signed)
 Physical Therapy Session Note  Patient Details  Name: Monica [facility] MRN: [id number] Date of Birth: [birth year]  Today's Date: 07/02/2024 PT Individual Time: 0820-0915 PT Individual Time Calculation (min): 55 min   Short Term Goals: Week 1:  PT Short Term Goal 1 (Week 1): pt will ambulate 150 feet with LRAD and CGA PT Short Term Goal 2 (Week 1): pt will perform bed to chair transfer with LRAD and CGA PT Short Term Goal 3 (Week 1): pt will navigate 12 steps with LRAD and CGA  Skilled Therapeutic Interventions/Progress Updates: Patient semi-reclined on entrance to room. Patient alert and agreeable to PT session.   Patient reported no pain. Pt required use of toilet and nsg care (missed time indicated for NT to assist). Pt sitting EOB on re-entry to room.  Therapeutic Activity: Transfers: Pt performed sit<>stand transfers throughout session with supervision in preparation for functional mobility. Provided VC for hand placement to control descent.  Gait Training:  Pt ambulated from room< main gym using RW with light CGA. Pt demonstrated the following gait deviations with therapist providing the described cuing and facilitation for improvement:  - Cues to depress B shoulders - Cues to increase upright posture. Pt demonstrating emergent awareness by stating rationale to standing upright as this increases anterior LOB - Cues to increase L step clearance   Pt ambulated around main gym and hallway (150'+) in RW with close supervision/light CGA following ankle weight/heel taps NMRE with pt demonstrating improved step through pattern on L LE to heel-strike and step clearance (min cuing to maintain). Pt still requires VC to increase upright posture throughout. Pt with improved carryover at end of session when ambulating back to room with heel-strike and clearance without cuing until end due to fatigue.  Neuromuscular Re-ed: - Pt ambulated roughly 55' around main gym/hallway loop in RW with 5lb ankle  weight L LE with VC to increase L step clearance/BOS, and to maintain upright posture. Pt also with decreased heel-strike with max cuing to maintain - Pt in // bars with 5lb ankle weight L LE stepping to 4 step with L LE staggered behind R LE. Pt cued to swing L LE through to step to tap heel (maintaining only heel contact) and then to step back staggered behind R LE. Pt with added cuing to increase hip flexion to avoid sliding foot on step. Pt performed 3 rounds close to fatigue with seated rest breaks. - Seated L knee flexion with red theraband and emphasis on controlling eccentric  - Seated L hip abduction with red theraband donned B knees and object between B feet to stabilize and pt cued to control eccentric  NMR performed for improvements in motor control and coordination, balance, sequencing, judgement, and self confidence/ efficacy in performing all aspects of mobility at highest level of independence.   Patient sitting EOB at end of session with brakes locked, bed alarm set, and all needs within reach.      Therapy Documentation Precautions:  Precautions Precautions: Fall Precaution/Restrictions Comments: L hemiparesis Restrictions Weight Bearing Restrictions Per Provider Order: No  Therapy/Group: Individual Therapy  Ammaar Encina PTA 07/02/2024, 12:23 PM

## 2024-07-02 NOTE — Progress Notes (Signed)
 Occupational Therapy Session Note  Patient Details  Name: Monica [facility] MRN: [id number] Date of Birth: 07-14-1956  Today's Date: 07/02/2024 OT Individual Time: 1132-1202 OT Individual Time Calculation (min): 30 min    Short Term Goals: Week 1:  OT Short Term Goal 1 (Week 1): patient will demonstrate increased transfers completing with CGA following taught strategies to improve balance OT Short Term Goal 2 (Week 1): patient will complete bilateral UE activity with 1-2 verbal cues for placement. OT Short Term Goal 3 (Week 1): patient will complete LB dressing with CGA to increase dynamic sitting and standing balane OT Short Term Goal 4 (Week 1): patient will demonstrate increased safety awareness verbalizing and completing 2 safety strategies with 2 or fewer cues.  Skilled Therapeutic Interventions/Progress Updates:    Patient received seated in recliner in room.  Patient able to state that she is not sure she will be able to return to work.  Patient states walking is better, and her left foot needs to work better - some insight into deficits.  Patient eating [name] and clearing throat.  States - my voice is heavier, and thinking ain't right yet.  Patient unsure if she will be able to return to work - but would like to if possible.  Patient agreeable to go for a walk, declined need to use the bathroom, and walked to ADL apartment.  Patient very excited to practice tub shower transfer and would like to get one for home.  Will talk with son as she mentioned having shower doors versus curtain.  Patient walked with walker with very narrow base, left foot almost scissoring right.  Patient left up in recliner chair for lunch with safety belt in place and engaged and call bell/ personal items in reach.    Therapy Documentation Precautions:  Precautions Precautions: Fall Precaution/Restrictions Comments: L hemiparesis Restrictions Weight Bearing Restrictions Per Provider Order: No   Pain:   Denies pain        Therapy/Group: Individual Therapy  Kylii Ennis M 07/02/2024, 12:11 PM

## 2024-07-02 NOTE — Patient Care Conference (Signed)
 Inpatient RehabilitationTeam Conference and Plan of Care Update Date: 07/02/2024   Time: 11:13 AM    Patient Name: Monica [facility]      Medical Record Number: [id number]  Date of Birth: 10/09/1956 Sex: Female         Room/Bed: 4M05C/4M05C-01 Payor Info: Payor: [employer]UE [employer] / Plan: BCBS COMM PPO / Product Type: *No Product type* /    Admit Date/Time:  06/26/2024 12:09 PM  Primary Diagnosis:  Acute ischemic right MCA stroke [facility])  Hospital Problems: Principal Problem:   Acute ischemic right MCA stroke [facility])    Expected Discharge Date: Expected Discharge Date: 07/11/24  Team Members Present: Physician leading conference: Dr. Prentice Compton Social Worker Present: Rhoda Clement, LCSW Nurse Present: Barnie Ronde, RN PT Present: Sherlean Perks, PT OT Present: Monica Peacock, OT SLP Present: Rosina Downy, SLP PPS Coordinator present : Eleanor Colon, SLP     Current Status/Progress Goal Weekly Team Focus  Bowel/Bladder   Continent x2            Swallow/Nutrition/ Hydration   Dys3/thin liquids   supervision  family education of appropriate items and ongoing diet tolerance    ADL's   Mod cueing / supervision BADL   Mod I   Safety awareness, balance with BADL    Mobility   supervision for bed mobility  CGA to supervision for transfers  CGA for ambulation with RW (L swing clearence issues due to coordinaton/motor planning)    supervision with ambulation with RW  - still need to figure out care at home for the middle part of the day after talking to son, he asked about options to receive help  safety awareness during gait (L foot clearance), dynamic balance    Communication   mild dysarthria   supervision   use of speech intelligibility strategies at the sentence level    Safety/Cognition/ Behavioral Observations  min deficits in alternating attention and problem solving, min to mod awareness deficits   supervision   alternating attention, problem  solving, awareness    Pain   N/a, no complaints of pain            Skin   n/a, skin intact            Discharge Planning:  Home daughter to come from [place]  to assist with care via FMLA. Pt hopes to be mod/i level. First conference will update target dc date   Team Discussion: Patient post right MCA CVA with safety and balance issues with no awareness of left lower extremity; drags toes.   Patient on target to meet rehab goals: yes, currently needs supervision for self care but requires moderate cues for safety.  Goals for discharge set for supervision overall.  *See Care Plan and progress notes for long and short-term goals.   Revisions to Treatment Plan:  N/a   Teaching Needs: Safety, medications, dietary modification, transfers, toileting, etc.   Current Barriers to Discharge: Decreased caregiver support and Home enviroment access/layout  Possible Resolutions to Barriers: Family education     Medical Summary Current Status: Right MCA stroke with left hemiparesis. Diabetes under control but hasn't been eating much. persistent nausea--likely due to metformin   Barriers to Discharge: Medical stability;Inadequate Nutritional Intake   Possible Resolutions to [employer]: stopped metformin , added protonix . encouraging po intake,fluids. adjusting dm regimen as needed.   Continued Need for Acute Rehabilitation Level of Care: The patient requires daily medical management by a physician with specialized training in physical medicine  and rehabilitation for the following reasons: Direction of a multidisciplinary physical rehabilitation program to maximize functional independence : Yes Medical management of patient stability for increased activity during participation in an intensive rehabilitation regime.: Yes Analysis of laboratory values and/or radiology reports with any subsequent need for medication adjustment and/or medical intervention. : Yes   I attest that  I was present, lead the team conference, and concur with the assessment and plan of the team.   Fredericka Sober B 07/02/2024, 3:35 PM

## 2024-07-03 DIAGNOSIS — I63511 Cerebral infarction due to unspecified occlusion or stenosis of right middle cerebral artery: Secondary | ICD-10-CM | POA: Diagnosis not present

## 2024-07-03 DIAGNOSIS — E119 Type 2 diabetes mellitus without complications: Secondary | ICD-10-CM | POA: Diagnosis not present

## 2024-07-03 DIAGNOSIS — R110 Nausea: Secondary | ICD-10-CM | POA: Diagnosis not present

## 2024-07-03 DIAGNOSIS — I10 Essential (primary) hypertension: Secondary | ICD-10-CM | POA: Diagnosis not present

## 2024-07-03 LAB — CBC
HCT: 35.3 % — ABNORMAL LOW (ref 36.0–46.0)
Hemoglobin: 11.9 g/dL — ABNORMAL LOW (ref 12.0–15.0)
MCH: 27.9 pg (ref 26.0–34.0)
MCHC: 33.7 g/dL (ref 30.0–36.0)
MCV: 82.9 fL (ref 80.0–100.0)
Platelets: 238 K/uL (ref 150–400)
RBC: 4.26 MIL/uL (ref 3.87–5.11)
RDW: 13.1 % (ref 11.5–15.5)
WBC: 5.8 K/uL (ref 4.0–10.5)
nRBC: 0 % (ref 0.0–0.2)

## 2024-07-03 LAB — BASIC METABOLIC PANEL WITH GFR
Anion gap: 7 (ref 5–15)
BUN: 19 mg/dL (ref 8–23)
CO2: 26 mmol/L (ref 22–32)
Calcium: 9.4 mg/dL (ref 8.9–10.3)
Chloride: 107 mmol/L (ref 98–111)
Creatinine, Ser: 0.87 mg/dL (ref 0.44–1.00)
GFR, Estimated: >60 mL/min (ref >60)
Glucose, Bld: 183 mg/dL — ABNORMAL HIGH (ref 70–99)
Potassium: 3.8 mmol/L (ref 3.5–5.1)
Sodium: 140 mmol/L (ref 135–145)

## 2024-07-03 LAB — GLUCOSE, CAPILLARY
Glucose-Capillary: 180 mg/dL — ABNORMAL HIGH (ref 70–99)
Glucose-Capillary: 129 mg/dL — ABNORMAL HIGH (ref 70–99)
Glucose-Capillary: 138 mg/dL — ABNORMAL HIGH (ref 70–99)
Glucose-Capillary: 228 mg/dL — ABNORMAL HIGH (ref 70–99)

## 2024-07-03 MED ORDER — GLIPIZIDE ER 2.5 MG PO TB24
2.5000 mg | ORAL_TABLET | Freq: Once | ORAL | Status: AC
  Administered 2024-07-03: 2.5 mg via ORAL
  Filled 2024-07-03: qty 1

## 2024-07-03 MED ORDER — GLIPIZIDE ER 5 MG PO TB24
5.0000 mg | ORAL_TABLET | Freq: Every day | ORAL | Status: DC
  Administered 2024-07-04: 5 mg via ORAL
  Filled 2024-07-03: qty 1

## 2024-07-03 NOTE — Progress Notes (Signed)
 Occupational Therapy Weekly Progress Note  Patient Details  Name: Monica [facility] MRN: [id number] Date of Birth: June 18, 1956  Beginning of progress report period: June 27, 2024 End of progress report period: July 03, 2024  Today's Date: 07/03/2024 OT Individual Time: 1045-1130 OT Individual Time Calculation (min): 45 min    Patient has met 4 of 4 short term goals.  Patient has shown physical improvement in use of Left arm and leg, and also starting to show some awareness of her cognitive deficits.    Patient continues to demonstrate the following deficits: unbalanced muscle activation and decreased coordination, decreased visual perceptual skills and decreased visual motor skills, decreased attention to left and decreased motor planning, and decreased attention, decreased awareness, decreased problem solving, decreased safety awareness, decreased memory, and delayed processing and therefore will continue to benefit from skilled OT intervention to enhance overall performance with BADL.  Patient progressing toward long term goals..  Continue plan of care.  OT Short Term Goals Week 1:  OT Short Term Goal 1 (Week 1): patient will demonstrate increased transfers completing with CGA following taught strategies to improve balance OT Short Term Goal 1 - Progress (Week 1): Met OT Short Term Goal 2 (Week 1): patient will complete bilateral UE activity with 1-2 verbal cues for placement. OT Short Term Goal 2 - Progress (Week 1): Met OT Short Term Goal 3 (Week 1): patient will complete LB dressing with CGA to increase dynamic sitting and standing balane OT Short Term Goal 3 - Progress (Week 1): Met OT Short Term Goal 4 (Week 1): patient will demonstrate increased safety awareness verbalizing and completing 2 safety strategies with 2 or fewer cues. OT Short Term Goal 4 - Progress (Week 1): Met Week 2:  OT Short Term Goal 1 (Week 2): STG=LTG due to LOS  Skilled Therapeutic Interventions/Progress  Updates:   Patient received seated in recliner.  Patient excited to shower indicating need to use toilet first.  Patient following all safety instructions given this session.  Patient walked to bathroom with min assist/ contact guard.  Patient with tendency to lead walking with head, and then short fast stepping pattern.  Patient managed clothing well for toileting then stepped around to shower.  Stand to sit is consistently to fast/ unsafe.  When this brought to patient's attention - she was able to use feedback and sit carefully into chair.  Patient needed assistance to wash hair.  Very appreciative of shower.  Patient walked to sink to complete grooming and hair care.  Returned to recliner one groomed and dressed.  Safety belt in place and engaged, call bell and personal items in reach.     Therapy Documentation Precautions:  Precautions Precautions: Fall Precaution/Restrictions Comments: L hemiparesis Restrictions Weight Bearing Restrictions Per Provider Order: No   Pain: Pain Assessment Denies pain     Therapy/Group: Individual Therapy  Jillian Warth M 07/03/2024, 12:30 PM

## 2024-07-03 NOTE — Progress Notes (Signed)
 Speech Language Pathology Weekly Progress and Session Note  Patient Details  Name: Monica [facility] MRN: [id number] Date of Birth: 1956/12/09  Beginning of progress report period: June 27, 2024 End of progress report period: July 03, 2024  Today's Date: 07/03/2024 SLP Individual Time: 1001-1039 SLP Individual Time Calculation (min): 38 min  Short Term Goals: Week 1: SLP Short Term Goal 1 (Week 1): Patient will attend to function therapy tasks for 20 minutes given min assist. SLP Short Term Goal 1 - Progress (Week 1): Met SLP Short Term Goal 2 (Week 1): Patient will solve mildly complex problems with 80% accuracy given min verbal cues. SLP Short Term Goal 2 - Progress (Week 1): Met SLP Short Term Goal 3 (Week 1): Patient will tolerate upgraded solid trials orally with min assist for use of compensatory swallowing strategies. SLP Short Term Goal 3 - Progress (Week 1): Met SLP Short Term Goal 4 (Week 1): Patient will indicate awareness of changes to cognitive status by stating 2 cognitive deficits given min assist. SLP Short Term Goal 4 - Progress (Week 1): Met SLP Short Term Goal 5 (Week 1): Patient will utilize SLOP speech intelligibility strategies in 80% of opportunities at the sentence level with min assist. SLP Short Term Goal 5 - Progress (Week 1): Progressing toward goal  New Short Term Goals: Week 2: SLP Short Term Goal 1 (Week 2): STGs=LTGs d/t ELOS  Weekly Progress Updates: Patient has made steady progress towards therapy goals, meeting 4/5 short term goals set for duration of this reporting period. Patient currently benefits from min assist for identification of cognitive deficits, sustained attention, and problem solving. She also benefits from min assist to utilize compensatory swallowing strategies for safe consumption of solids. Patient continues to progress towards speech intelligibility goals peripherally, though not directly targeted this reporting period due to time  constraints. Will plan to focus on this impairment during upcoming reporting period. Patient and family education ongoing. Patient will continue to benefit from skilled therapy services during remainder of CIR stay.     Intensity: Minumum of 1-2 x/day, 30 to 90 minutes Frequency: 3 to 5 out of 7 days Duration/Length of Stay: 7/18 Treatment/Interventions: Cognitive remediation/compensation;Functional tasks;Cueing hierarchy;Internal/external aids;Patient/family education;Medication managment;Speech/Language facilitation;Dysphagia/aspiration precaution training;Therapeutic Activities;Environmental controls   Daily Session  Skilled Therapeutic Interventions: SLP conducted skilled therapy session targeting cognitive goals. SLP facilitated mildly complex problem solving task where patient was asked to sort items in a bank account based on income and payments as well as tasked with balancing the bank account after all transactions were accounted for. Patient benefited from mod assist for thought organization and min assist for math components of task. Given patient's difficulty with task, SLP provided comparison of attention to detail needed for task to attention to detail needed for patient's specific work responsibility to increase anticipatory awareness. Patient benefited from mod assist to make comparisons and identify potential challenges in returning to work. SLP encouraged patient to continue to take time to engage in therapy services after discharge prior to attempting work. Patient verbalized understanding. SLP then facilitated graph interpretation task where patient solved mildly complex problems with min cues for accurate scanning and supervision for completion of math components. In final minutes, targeted completion of basic to mildly complex math problems of varying applications with patient benefiting from min assist for switching cognitive set, attention to detail, and accuracy. Patient was left  in room with call bell in reach and alarm set. SLP will continue to target goals per plan  of care.       Pain Pain Assessment Pain Scale: 0-10 Pain Score: 5  Pain Location: Abdomen  Therapy/Group: Individual Therapy  Tyjay Galindo, M.A., CCC-SLP  Jacon Whetzel A Solyana Nonaka 07/03/2024, 10:39 AM

## 2024-07-03 NOTE — Progress Notes (Signed)
 Physical Therapy Session Note  Patient Details  Name: Monica [facility] MRN: [id number] Date of Birth: Dec 28, 1955  Today's Date: 07/03/2024 PT Individual Time: 0800-0915 PT Individual Time Calculation (min): 75 min   Short Term Goals: Week 1:  PT Short Term Goal 1 (Week 1): pt will ambulate 150 feet with LRAD and CGA PT Short Term Goal 2 (Week 1): pt will perform bed to chair transfer with LRAD and CGA PT Short Term Goal 3 (Week 1): pt will navigate 12 steps with LRAD and CGA  Skilled Therapeutic Interventions/Progress Updates: Patient eating breakfast in bed on entrance to room. Patient alert and agreeable to PT session.   Patient reported that she is feeling good about her discharge date and is looking forward to leaving.   Therapeutic Activity: Bed Mobility: Pt performed supine<>sit on EOB with supervision. No cues needed Transfers: Pt performed sit<>stand and stand sit transfers throughout session with CGA. Provided VC for proper pivoting for sitting down.  Gait Training:  Pt ambulated ~300 ft  using RW with CGA. Pt demonstrated the following gait deviations with therapist providing the described cuing and facilitation for improvement:  standing upright, keeping RW within BOS, proper foot clearance on the L, and keeping a constant gait speed without accelerating.   Neuromuscular Re-ed: NMR facilitated during session with focus on dynamic balance, gait mechanics, single leg balance, coordination, and reaction time. -Single Leg Balance -hand help support / CGA -Standing weight shifts - stand by -Weight shifts with pertabations - minA -Hurdlge stepping with RW - CGA -Resisted Walk with red theraband - 160ft - CGA -Big Step Matrix-  with hand help support - Pt was able to take large steps while mirroring the therapist's movements - hand help support -Trampaline ball tossing and catching + side stepping - hand help support- Pt tossed the ball agaisnt the trampaline and caught the ball  while side stepping at the same time  NMR performed for improvements in motor control and coordination, balance, sequencing, judgement, and self confidence/ efficacy in performing all aspects of mobility at highest level of independence.   Patient in recliner at end of session with brakes locked, chair alarm set, and all needs within reach.      Therapy Documentation Precautions:  Precautions Precautions: Fall Precaution/Restrictions Comments: L hemiparesis Restrictions Weight Bearing Restrictions Per Provider Order: No    Therapy/Group: Individual Therapy  Elexus Barman 07/03/2024, 10:26 AM

## 2024-07-03 NOTE — Progress Notes (Addendum)
 Patient ID: Monica [facility], female   DOB: Jul 08, 1956, 68 y.o.   MRN: [id number]  Spoke with Caleb via telephone to answer his questions who would provide care to Monica [facility] when leaves the [place]. Discussed hired assist versus FMLA by family member. He was hoping insurance would cover this which it does not due to it is custodial care. Discussed ALF and that this is private pay. Will leave private duty list for son in pt';s room and work on discharge needs. Asked about pt;s daughter coming and assisting via FMLA as was discussed prior to admission and he reports people say things but may not follow through on. Family will need to come up with a plan for pt. Made aware of target discharge 7/18.  12:42 PM met with pt to give private duty list for son and she was asking questions regarding coverage for care. She has a friend who can come and discussed her coming to therapies with prior to discharging home. She will let worker know when can come. Discussed with her insurance coverage also.

## 2024-07-03 NOTE — Progress Notes (Signed)
 PROGRESS NOTE   Subjective/Complaints: Found pt as she was walking with PT. No new complaints this morning. Had questions about discharge and follow up  ROS: Patient denies fever, rash, sore throat, blurred vision, dizziness, nausea, vomiting, diarrhea, cough, shortness of breath or chest pain, joint or back/neck pain, headache, or mood change.    Objective:   No results found.  Recent Labs    07/03/24 0559  WBC 5.8  HGB 11.9*  HCT 35.3*  PLT 238   Recent Labs    07/03/24 0559  NA 140  K 3.8  CL 107  CO2 26  GLUCOSE 183*  BUN 19  CREATININE 0.87  CALCIUM  9.4    Intake/Output Summary (Last 24 hours) at 07/03/2024 0942 Last data filed at 07/03/2024 0815 Gross per 24 hour  Intake 477 ml  Output --  Net 477 ml        Physical Exam: Vital Signs Blood pressure 118/86, pulse 69, temperature 97.8 F (36.6 C), temperature source Oral, resp. rate 18, height 5' 7 (1.702 m), weight 82.2 kg, SpO2 100%. Constitutional: No distress . Vital signs reviewed. HEENT: NCAT, EOMI, oral membranes moist Neck: supple Cardiovascular: RRR without murmur. No JVD    Respiratory/Chest: CTA Bilaterally without wheezes or rales. Normal effort    GI/Abdomen: BS +, non-tender, non-distended Ext: no clubbing, cyanosis, or edema Psych: pleasant and cooperative  Skin: intact Musculoskeletal:     Cervical back: Neck supple. No tenderness.      Neuro: RUE 5/5 thorughout LUE- ~4/5 ina ll muscles except FA which is 2+/5 RLE- 5/5 throughout LLE- 4+ throughout, stable 7/8      Mental Status: She is alert.     Comments: Left facial weakness with mild dysarthria. Able to answer orientation questions without difficulty and follow simple one and two step commands. Left sided weakness.  Fair insight and awareness. Oriented to hospital, month, aware of situation  Intact to light touch in all 4 extremities   Ambulates with RW, catches right  front foot on floor at times, struggles with isolated dorsiflexion of foot    Assessment/Plan: 1. Functional deficits which require 3+ hours per day of interdisciplinary therapy in a comprehensive inpatient rehab setting. Physiatrist is providing close team supervision and 24 hour management of active medical problems listed below. Physiatrist and rehab team continue to assess barriers to discharge/monitor patient progress toward functional and medical goals  Care Tool:  Bathing    Body parts bathed by patient: Left upper leg, Right arm, Left arm, Right lower leg, Chest, Abdomen, Left lower leg, Face, Front perineal area, Right upper leg, Buttocks   Body parts bathed by helper: Buttocks     Bathing assist Assist Level: Minimal Assistance - Patient > 75%     Upper Body Dressing/Undressing Upper body dressing   What is the patient wearing?: Pull over shirt    Upper body assist Assist Level: Set up assist    Lower Body Dressing/Undressing Lower body dressing      What is the patient wearing?: Pants, Underwear/pull up     Lower body assist Assist for lower body dressing: Minimal Assistance - Patient > 75%  Toileting Toileting    Toileting assist Assist for toileting: Moderate Assistance - Patient 50 - 74%     Transfers Chair/bed transfer  Transfers assist     Chair/bed transfer assist level: Minimal Assistance - Patient > 75%     Locomotion Ambulation   Ambulation assist      Assist level: Minimal Assistance - Patient > 75% Assistive device: Rollator (HHA and Rollator min A) Max distance: 115   Walk 10 feet activity   Assist     Assist level: Minimal Assistance - Patient > 75% Assistive device: Hand held assist   Walk 50 feet activity   Assist    Assist level: Minimal Assistance - Patient > 75% Assistive device: Rollator    Walk 150 feet activity   Assist Walk 150 feet activity did not occur:  (fatigue)         Walk 10 feet on  uneven surface  activity   Assist     Assist level: Moderate Assistance - Patient - 50 - 74%     Wheelchair     Assist Is the patient using a wheelchair?: Yes Type of Wheelchair: Manual    Wheelchair assist level: Dependent - Patient 0%      Wheelchair 50 feet with 2 turns activity    Assist        Assist Level: Dependent - Patient 0%   Wheelchair 150 feet activity     Assist      Assist Level: Dependent - Patient 0%   Blood pressure 118/86, pulse 69, temperature 97.8 F (36.6 C), temperature source Oral, resp. rate 18, height 5' 7 (1.702 m), weight 82.2 kg, SpO2 100%.  Medical Problem List and Plan: 1. Functional deficits secondary to R BG stroke/R MCA stroke             -patient may  shower             -ELOS/Goals: 10-14 days- supervision to mod I- needs PT, OT and SLP             Grounds pass ordered  Vitamin D  started, level is 31  -Continue CIR therapies including PT, OT, and SLP, sup/min with mobility. Might benefit from kick plate for right shoe  2.  Antithrombotics: -DVT/anticoagulation:  Pharmaceutical: continue Lovenox              -antiplatelet therapy: DAPT X 3 months followed by ASA  3. Pain Management: tylenol  prn.  4. Mood/Behavior/Sleep: LCSW to follow for evaluation and support.              -antipsychotic agents: N/A 5. Neuropsych/cognition: This patient is capable of making decisions on her own behalf. 6. Skin/Wound Care: Routine pressure relief measures.  7. Fluids/Electrolytes/Nutrition: improved po  -I personally reviewed the patient's labs today.   8. E coli UTI: Keflex  antibiotic D#3/7 9. Hypotension fluctuating with hypertension: decrease Toprol  XL to 12.5mg  daily. Avoid hypotension with evidence of intracranial stenosis. Add magnesium  supplement            7/10 bp controlled 10.  T2DM: Hgb A1c- 9.7. Monitor BS ac/hs and use SSI for elevated BS. Add CM restrictions to diet.             --Was on metformin  PTA with poor  control.  --now on Insulin  glargline. decrease SSI to sensitive.               --RD consulted for education on carb modified diet and Rehab RN for insulin   education.   -provide list of foods for diabetes  -CBGs reviewed  7/0--stopped metformin  d/t nausea--cbg's increasing a bit now that she's eating more as well.    -began low dose glucotrol ---increase to 5mg  xl today   -continue same semglee      CBG (last 3)  Recent Labs    07/02/24 1701 07/02/24 1955 07/03/24 0610  GLUCAP 170* 211* 180*    11. Dyslipidemia: continue Crestor .   12.  Mild intermittent asthma: continue Singulair   13. Thyroid nodules/enlargement: Ultrasound for work up after discharge.   14. Nausea:d/t metformin ---stopped  - resolved   -push po/fluids.  15. Fibroid uterus: refer for outpatient OB/GYN f/u    LOS: 7 days A FACE TO FACE EVALUATION WAS PERFORMED  Monica Austin 07/03/2024, 9:42 AM

## 2024-07-03 NOTE — Plan of Care (Signed)
  Problem: RH BLADDER ELIMINATION Goal: RH STG MANAGE BLADDER WITH ASSISTANCE Description: STG Manage Bladder With toileting Assistance Outcome: Progressing   Problem: RH KNOWLEDGE DEFICIT Goal: RH STG INCREASE KNOWLEDGE OF DIABETES Description: Patient and dtr will be able to manage DM using educational resources for medications, and dietary modification independently Outcome: Progressing Goal: RH STG INCREASE KNOWLEDGE OF DYSPHAGIA/FLUID INTAKE Description: Patient and dtr will be able to manage dysphagia using educational resources for medications, and dietary modification independently Outcome: Progressing Goal: RH STG INCREASE KNOWLEDGE OF STROKE PROPHYLAXIS Description: Patient and dtr will be able to manage secondary risks using educational resources for medications, and dietary modification independently Outcome: Progressing

## 2024-07-03 NOTE — Progress Notes (Signed)
 Speech Language Pathology Daily Session Note  Patient Details  Name: Monica Austin MRN: [id number] Date of Birth: 1956/03/19  Today's Date: 07/03/2024 SLP Individual Time: 1325-1409 SLP Individual Time Calculation (min): 44 min  Short Term Goals: Week 1: SLP Short Term Goal 1 (Week 1): Patient will attend to function therapy tasks for 20 minutes given min assist. SLP Short Term Goal 2 (Week 1): Patient will solve mildly complex problems with 80% accuracy given min verbal cues. SLP Short Term Goal 3 (Week 1): Patient will tolerate upgraded solid trials orally with min assist for use of compensatory swallowing strategies. SLP Short Term Goal 4 (Week 1): Patient will indicate awareness of changes to cognitive status by stating 2 cognitive deficits given min assist. SLP Short Term Goal 5 (Week 1): Patient will utilize SLOP speech intelligibility strategies in 80% of opportunities at the sentence level with min assist.  Skilled Therapeutic Interventions: Skilled therapy session focused on cognitive goals. SLP facilitated session by prompting completion of meal budgeting task. Patient independently named meal including appetizer, main course, dessert and drink in which could be budgeted for under $50. Patient then independently created grocery list and utilized technology to identify cost of each item (with physical help from SLP). SLP targeted problem solving through encouraging patient to complete simple addition of grocery items to ensure budget was kept. Patient required supervision-minA to complete. Patient left in bed with alarm set and call bell in reach. Continue POC.  Pain Denies  Therapy/Group: Individual Therapy  Niv Darley M.A., CCC-SLP 07/03/2024, 7:41 AM

## 2024-07-04 DIAGNOSIS — I63511 Cerebral infarction due to unspecified occlusion or stenosis of right middle cerebral artery: Secondary | ICD-10-CM | POA: Diagnosis not present

## 2024-07-04 DIAGNOSIS — E119 Type 2 diabetes mellitus without complications: Secondary | ICD-10-CM | POA: Diagnosis not present

## 2024-07-04 DIAGNOSIS — R110 Nausea: Secondary | ICD-10-CM | POA: Diagnosis not present

## 2024-07-04 DIAGNOSIS — I10 Essential (primary) hypertension: Secondary | ICD-10-CM | POA: Diagnosis not present

## 2024-07-04 LAB — GLUCOSE, CAPILLARY
Glucose-Capillary: 177 mg/dL — ABNORMAL HIGH (ref 70–99)
Glucose-Capillary: 254 mg/dL — ABNORMAL HIGH (ref 70–99)
Glucose-Capillary: 215 mg/dL — ABNORMAL HIGH (ref 70–99)
Glucose-Capillary: 226 mg/dL — ABNORMAL HIGH (ref 70–99)

## 2024-07-04 MED ORDER — GLIPIZIDE ER 10 MG PO TB24
10.0000 mg | ORAL_TABLET | Freq: Every day | ORAL | Status: DC
  Administered 2024-07-05 – 2024-07-11 (×7): 10 mg via ORAL
  Filled 2024-07-04 (×7): qty 1

## 2024-07-04 NOTE — Progress Notes (Signed)
 Physical Therapy Weekly Progress Note  Patient Details  Name: Monica Austin MRN: [id number] Date of Birth: 02-17-1956  Beginning of progress report period: June 27, 2024 End of progress report period: July 04, 2024   Patient has met 3 of 3 short term goals.  [name] is making appropriate progress this past reporting period. She's completing functional mobility at a CGA to intermittent minA level using the RW for AD. She's ambulating distances >156ft and navigating 12_+stairs. She is primarily limited by cognitive impairments, decreased safety awareness, poor postural awareness, and limited activity tolerance. She remains a high falls risk and will benefit from further skilled PT to develop functional independence.  Her 5xSTS is 25 seconds and her TUG is 18 seconds.   Patient continues to demonstrate the following deficits muscle weakness, decreased cardiorespiratoy endurance, impaired timing and sequencing, unbalanced muscle activation, motor apraxia, and decreased coordination, decreased visual perceptual skills, decreased motor planning, decreased initiation, decreased attention, decreased awareness, decreased problem solving, decreased safety awareness, decreased memory, and delayed processing, and decreased standing balance, decreased postural control, hemiplegia, and decreased balance strategies and therefore will continue to benefit from skilled PT intervention to increase functional independence with mobility.  Patient progressing toward long term goals..  Plan of care revisions: Goals downgraded from mod I to supervision given her cognitive and awareness deficits.  PT Short Term Goals Week 1:  PT Short Term Goal 1 (Week 1): pt will ambulate 150 feet with LRAD and CGA PT Short Term Goal 1 - Progress (Week 1): Met PT Short Term Goal 2 (Week 1): pt will perform bed to chair transfer with LRAD and CGA PT Short Term Goal 2 - Progress (Week 1): Met PT Short Term Goal 3 (Week 1): pt will  navigate 12 steps with LRAD and CGA PT Short Term Goal 3 - Progress (Week 1): Met Week 2:  PT Short Term Goal 1 (Week 2): STG = LTG due to ELOS   Therapy Documentation Precautions:  Precautions Precautions: Fall Precaution/Restrictions Comments: L hemiparesis Restrictions Weight Bearing Restrictions Per Provider Order: No General:     Therapy/Group: Individual Therapy  [name] 07/04/2024, 7:51 AM

## 2024-07-04 NOTE — Progress Notes (Signed)
 PROGRESS NOTE   Subjective/Complaints: Pt in good spirits. Feels well. Asked about follow up after discharge. No new problems today  ROS: Patient denies fever, rash, sore throat, blurred vision, dizziness, nausea, vomiting, diarrhea, cough, shortness of breath or chest pain, joint or back/neck pain, headache, or mood change.   Objective:   No results found.  Recent Labs    07/03/24 0559  WBC 5.8  HGB 11.9*  HCT 35.3*  PLT 238   Recent Labs    07/03/24 0559  NA 140  K 3.8  CL 107  CO2 26  GLUCOSE 183*  BUN 19  CREATININE 0.87  CALCIUM  9.4    Intake/Output Summary (Last 24 hours) at 07/04/2024 1036 Last data filed at 07/04/2024 0900 Gross per 24 hour  Intake 550 ml  Output --  Net 550 ml        Physical Exam: Vital Signs Blood pressure (!) 109/58, pulse (!) 59, temperature 97.8 F (36.6 C), resp. rate 17, height 5' 7 (1.702 m), weight 82.2 kg, SpO2 99%. Constitutional: No distress . Vital signs reviewed. HEENT: NCAT, EOMI, oral membranes moist Neck: supple Cardiovascular: RRR without murmur. No JVD    Respiratory/Chest: CTA Bilaterally without wheezes or rales. Normal effort    GI/Abdomen: BS +, non-tender, non-distended Ext: no clubbing, cyanosis, or edema Psych: pleasant and cooperative, more dynamic! Skin: intact Musculoskeletal:     Cervical back: Neck supple. No tenderness.      Neuro: RUE 5/5 thorughout LUE- ~4/5 in all muscles except FA which is 2+ to 3-/5 RLE- 5/5 throughout LLE- 4+ throughout, stable 7/11    Comments: Left facial weakness with mild dysarthria. Able to answer orientation questions without difficulty and follow simple one and two step commands. Fair insight and awareness. Oriented to hospital, month, aware of situation  Intact to light touch in all 4 extremities      Assessment/Plan: 1. Functional deficits which require 3+ hours per day of interdisciplinary therapy in a  comprehensive inpatient rehab setting. Physiatrist is providing close team supervision and 24 hour management of active medical problems listed below. Physiatrist and rehab team continue to assess barriers to discharge/monitor patient progress toward functional and medical goals  Care Tool:  Bathing    Body parts bathed by patient: Left upper leg, Right arm, Left arm, Right lower leg, Chest, Abdomen, Left lower leg, Face, Front perineal area, Right upper leg, Buttocks   Body parts bathed by helper: Buttocks     Bathing assist Assist Level: Contact Guard/Touching assist     Upper Body Dressing/Undressing Upper body dressing   What is the patient wearing?: Pull over shirt    Upper body assist Assist Level: Set up assist    Lower Body Dressing/Undressing Lower body dressing      What is the patient wearing?: Pants, Underwear/pull up     Lower body assist Assist for lower body dressing: Contact Guard/Touching assist     Toileting Toileting    Toileting assist Assist for toileting: Minimal Assistance - Patient > 75%     Transfers Chair/bed transfer  Transfers assist     Chair/bed transfer assist level: Contact Guard/Touching assist  Locomotion Ambulation   Ambulation assist      Assist level: Minimal Assistance - Patient > 75% Assistive device: Rollator (HHA and Rollator min A) Max distance: 115   Walk 10 feet activity   Assist     Assist level: Minimal Assistance - Patient > 75% Assistive device: Hand held assist   Walk 50 feet activity   Assist    Assist level: Minimal Assistance - Patient > 75% Assistive device: Rollator    Walk 150 feet activity   Assist Walk 150 feet activity did not occur:  (fatigue)         Walk 10 feet on uneven surface  activity   Assist     Assist level: Moderate Assistance - Patient - 50 - 74%     Wheelchair     Assist Is the patient using a wheelchair?: Yes Type of Wheelchair: Manual     Wheelchair assist level: Dependent - Patient 0%      Wheelchair 50 feet with 2 turns activity    Assist        Assist Level: Dependent - Patient 0%   Wheelchair 150 feet activity     Assist      Assist Level: Dependent - Patient 0%   Blood pressure (!) 109/58, pulse (!) 59, temperature 97.8 F (36.6 C), resp. rate 17, height 5' 7 (1.702 m), weight 82.2 kg, SpO2 99%.  Medical Problem List and Plan: 1. Functional deficits secondary to R BG stroke/R MCA stroke             -patient may  shower             -ELOS/Goals: 10-14 days- supervision to mod I- needs PT, OT and SLP             Grounds pass ordered  --Continue CIR therapies including PT, OT, and SLP.  Might benefit from kick plate for right shoe vs AFO  2.  Antithrombotics: -DVT/anticoagulation:  Pharmaceutical: continue Lovenox              -antiplatelet therapy: DAPT X 3 months followed by ASA  3. Pain Management: tylenol  prn.  4. Mood/Behavior/Sleep: LCSW to follow for evaluation and support.              -antipsychotic agents: N/A 5. Neuropsych/cognition: This patient is capable of making decisions on her own behalf. 6. Skin/Wound Care: Routine pressure relief measures.  7. Fluids/Electrolytes/Nutrition: improved po  -I personally reviewed the patient's labs today.   8. E coli UTI: Keflex  antibiotic D#3/7 9. Hypotension fluctuating with hypertension: decrease Toprol  XL to 12.5mg  daily. Avoid hypotension with evidence of intracranial stenosis. Add magnesium  supplement            7/11 bp controlled 10.  T2DM: Hgb A1c- 9.7. Monitor BS ac/hs and use SSI for elevated BS. Add CM restrictions to diet.             --Was on metformin  PTA with poor control.  --now on Insulin  glargline. decrease SSI to sensitive.               --RD consulted for education on carb modified diet and Rehab RN for insulin  education.   -provide list of foods for diabetes  -CBGs reviewed  7/11-stopped metformin  d/t nausea--cbg's  increasing a bit now that she's eating more as well.    -began low dose glucotrol ---increased to 5mg  xl 7/10   -continue same semglee    -will need further titration of glucotrol --increase to 10mg   xl effective 7/12 CBG (last 3)  Recent Labs    07/03/24 1645 07/03/24 2020 07/04/24 0608  GLUCAP 138* 228* 177*    11. Dyslipidemia: continue Crestor .   12.  Mild intermittent asthma: continue Singulair   13. Thyroid nodules/enlargement: Ultrasound for work up after discharge.   14. Nausea:d/t metformin ---stopped  - resolved   -push po/fluids.  15. Fibroid uterus: refer for outpatient OB/GYN f/u    LOS: 8 days A FACE TO FACE EVALUATION WAS PERFORMED  Monica Austin 07/04/2024, 10:36 AM

## 2024-07-04 NOTE — Progress Notes (Signed)
 Occupational Therapy Session Note  Patient Details  Name: Monica Austin MRN: [id number] Date of Birth: [birth year]  Today's Date: 07/04/2024 OT Individual Time: 1117-1200 OT Individual Time Calculation (min): 43 min    Short Term Goals: Week 2:  OT Short Term Goal 1 (Week 2): STG=LTG due to LOS  Skilled Therapeutic Interventions/Progress Updates:    Patient received seated in recliner.  Patient walked to shower without walker and contact guard to close supervision.  Patient needs cueing and facilitation for upright posture, although step length and width improving.  Patient showered with supervision following set up.  Patient dressed herself with minimal cueing, then walked to sink to complete grooming.  Assisted patient to comb through and style hair.  Patient walked throughout rehab unit with emphasis on upright posture, and gait speed/ cadence, safety with turns, avoiding obstacles.  Returned to room and left up in recliner with safety belt in place and engaged and personal items in reach.    Therapy Documentation Precautions:  Precautions Precautions: Fall Precaution/Restrictions Comments: L hemiparesis Restrictions Weight Bearing Restrictions Per Provider Order: No   Pain: Pain Assessment Pain Scale: 0-10 Pain Score: 0-No pain    Therapy/Group: Individual Therapy  Jilliane Kazanjian M 07/04/2024, 1:06 PM

## 2024-07-04 NOTE — Plan of Care (Signed)
 Goals downgraded to supervision given her cognitive and awareness deficits.   Problem: RH Balance Goal: LTG Patient will maintain dynamic standing balance (PT) Description: LTG:  Patient will maintain dynamic standing balance with assistance during mobility activities (PT) Flowsheets (Taken 07/04/2024 1303) LTG: Pt will maintain dynamic standing balance during mobility activities with:: Supervision/Verbal cueing   Problem: Sit to Stand Goal: LTG:  Patient will perform sit to stand with assistance level (PT) Description: LTG:  Patient will perform sit to stand with assistance level (PT) Flowsheets (Taken 07/04/2024 1303) LTG: PT will perform sit to stand in preparation for functional mobility with assistance level: Supervision/Verbal cueing   Problem: RH Bed Mobility Goal: LTG Patient will perform bed mobility with assist (PT) Description: LTG: Patient will perform bed mobility with assistance, with/without cues (PT). Flowsheets (Taken 07/04/2024 1303) LTG: Pt will perform bed mobility with assistance level of: Supervision/Verbal cueing   Problem: RH Bed to Chair Transfers Goal: LTG Patient will perform bed/chair transfers w/assist (PT) Description: LTG: Patient will perform bed to chair transfers with assistance (PT). Flowsheets (Taken 07/04/2024 1303) LTG: Pt will perform Bed to Chair Transfers with assistance level: Supervision/Verbal cueing   Problem: RH Car Transfers Goal: LTG Patient will perform car transfers with assist (PT) Description: LTG: Patient will perform car transfers with assistance (PT). Flowsheets (Taken 07/04/2024 1303) LTG: Pt will perform car transfers with assist:: Supervision/Verbal cueing   Problem: RH Ambulation Goal: LTG Patient will ambulate in controlled environment (PT) Description: LTG: Patient will ambulate in a controlled environment, # of feet with assistance (PT). Flowsheets (Taken 07/04/2024 1303) LTG: Pt will ambulate in controlled environ  assist  needed:: Supervision/Verbal cueing LTG: Ambulation distance in controlled environment: 150ft Goal: LTG Patient will ambulate in home environment (PT) Description: LTG: Patient will ambulate in home environment, # of feet with assistance (PT). Flowsheets (Taken 07/04/2024 1303) LTG: Pt will ambulate in home environ  assist needed:: Supervision/Verbal cueing LTG: Ambulation distance in home environment: 46ft Goal: LTG Patient will ambulate in community environment (PT) Description: LTG: Patient will ambulate in community environment, # of feet with assistance (PT). Flowsheets (Taken 07/04/2024 1303) LTG: Pt will ambulate in community environ  assist needed:: Supervision/Verbal cueing LTG: Ambulation distance in community environment: 271ft   Problem: RH Stairs Goal: LTG Patient will ambulate up and down stairs w/assist (PT) Description: LTG: Patient will ambulate up and down # of stairs with assistance (PT) Flowsheets (Taken 07/04/2024 1303) LTG: Pt will ambulate up/down stairs assist needed:: Supervision/Verbal cueing LTG: Pt will  ambulate up and down number of stairs: 12 steps

## 2024-07-04 NOTE — Progress Notes (Signed)
 Physical Therapy Session Note  Patient Details  Name: Monica Austin MRN: [id number] Date of Birth: [birth year]  Today's Date: 07/04/2024 PT Individual Time: 1000-1100 + 1300-1358 min PT Individual Time Calculation (min): 60 min  + 58 min  Short Term Goals: Week 1:  PT Short Term Goal 1 (Week 1): pt will ambulate 150 feet with LRAD and CGA PT Short Term Goal 2 (Week 1): pt will perform bed to chair transfer with LRAD and CGA PT Short Term Goal 3 (Week 1): pt will navigate 12 steps with LRAD and CGA  Skilled Therapeutic Interventions/Progress Updates:      1st session: Pt lying in bed on arrival. She has no complaints of pain, requests assistance with getting paper pants to get dressed. Patient donns pants at setupA level while she sit's EOB - cues for general safety awareness and sequencing. Don's slip-on shoes with setupA as she sit's EOB as well. Sit<>stand to RW with CGA with cues for safety and pacing. Ambulates with CGA and RW from her room to main gym, ~125ft. Patient running into door frame on her L side while exiting the room with decreased awareness and insight. Safety cues needed while ambulating in hallways - has forward flexed trunk with RW far from her body, her BLE has narrow BOS as well.   Stair training completed using 6 steps and 2 hand rails in the rehab gym - patient navigated x12 steps at [facility] level with safety cues for awareness and pacing. Patient also needed cues for awareness to keep her L hand in front of her and she will be inattentive and leave it behind her while going down the stairs.   Wrapped red TB around front frame of RW to provide patient with a visual cue to help improve her proximity to the RW as she ambulates. This had good carryover during session but still struggles with narrow BOS and intermittent catching of her L foot in swing. Worked on [employer] for forced use of LLE - 4# ankle weight + red TB (red TB wrapped around her ankle and around PT ankle to provide  resistance during swing phase) while ambulating with RW 4x281ft and CGA. Re-tested patient's step through and awareness with no weight or resistance and improved compared to baseline.   Five times Sit to Stand Test (FTSS) Method: Use a straight back chair with a solid seat that is 16-18" high. Ask participant to sit on the chair with arms folded across their chest.   Instructions: "Stand up and sit down as quickly as possible 5 times, keeping your arms folded across your chest."   Measurement: Stop timing when the participant stands the 5th time.  TIME: 25  (in seconds)  Times > 13.6 seconds is associated with increased disability and morbidity (Guralnik, 2000) Times > 15 seconds is predictive of recurrent falls in healthy individuals aged 45 and older (Buatois, et al., 2008) Normal performance values in community dwelling individuals aged 77 and older (Bohannon, 2006): 60-69 years: 11.4 seconds 70-79 years: 12.6 seconds 80-89 years: 14.8 seconds  MCID: >= 2.3 seconds for Vestibular Disorders (Meretta, 2006)  Timed up and Go completed in 18 seconds using the RW with CGA for safety. Scores > 15 seconds indicate increased falls risk.    2nd session: Pt in bed to start, patient in agreement to therapy treatment but reports she's close to falling asleep. Pt requesting to go outside for fresh air and sunshine. Bed mobility completed at supervision level with hospital bed  features. Donned shoes at EOB with setupA. Sit<>stand to RW with safety cues for hand placement and CGA for balance. She ambulates with CGA and RW from her room, outside area. She needs safety cues for stepping on/off elevator to manage the threshold and avoid tripping hazard. She stops in the [employer] shop to window shop and needs safety cues for awareness of her L side to prevent from running into objects and people. While outdoors, patient requires CGA for navigating unlevel surfaces which were paved but sloped. Safety cues  throughout for awareness and fall prevention. Pt ambulated variable distances while outdoors, ranging from 100-347ft with sitting rest breaks. Pt returned upstairs to CIR with similar assist and cues as above.  Completed N[place] for x15 minutes where she completed 0.4 miles, 632 steps, and L5 resistance throughout. Used Trail program for attention and distraction.   Pt returned to her room and patient requesting to lie down to rest. She was positioned in bed with supervision assist for setup and lying in the middle of the bed. Alarm set, needs met, call bell in lap.   Therapy Documentation  Precautions:  Precautions Precautions: Fall Precaution/Restrictions Comments: L hemiparesis Restrictions Weight Bearing Restrictions Per Provider Order: No General:     Therapy/Group: Individual Therapy  [name] 07/04/2024, 7:50 AM

## 2024-07-04 NOTE — Progress Notes (Signed)
 Speech Language Pathology Daily Session Note  Patient Details  Name: Monica Austin MRN: [id number] Date of Birth: January 27, 1956  Today's Date: 07/04/2024 SLP Individual Time: 1430-1515 SLP Individual Time Calculation (min): 45 min  Short Term Goals: Week 2: SLP Short Term Goal 1 (Week 2): STGs=LTGs d/t ELOS  Skilled Therapeutic Interventions: Skilled therapy session focused on cognitive goals. SLP facilitated session by prompting patient to navigate to various written locations via use of signs. Patient independently locate hospital gift shop. SLP then targeted problem solving and memory goals through abstract card game. Patient required supervisionA to participate in game and follow verbalized directions. Patient left in bed with alarm set and call bell in reach. Continue POC.  Pain None reported   Therapy/Group: Individual Therapy  Neria Procter M.A., CCC-SLP 07/04/2024, 7:52 AM

## 2024-07-05 DIAGNOSIS — I63511 Cerebral infarction due to unspecified occlusion or stenosis of right middle cerebral artery: Secondary | ICD-10-CM | POA: Diagnosis not present

## 2024-07-05 LAB — GLUCOSE, CAPILLARY
Glucose-Capillary: 147 mg/dL — ABNORMAL HIGH (ref 70–99)
Glucose-Capillary: 239 mg/dL — ABNORMAL HIGH (ref 70–99)
Glucose-Capillary: 191 mg/dL — ABNORMAL HIGH (ref 70–99)
Glucose-Capillary: 264 mg/dL — ABNORMAL HIGH (ref 70–99)

## 2024-07-05 NOTE — Progress Notes (Signed)
 Occupational Therapy Session Note  Patient Details  Name: Monica Austin MRN: [id number] Date of Birth: 1956-08-23  Today's Date: 07/05/2024 OT Individual Time: 9052-8949 OT Individual Time Calculation (min): 63 min    Short Term Goals: Week 2:  OT Short Term Goal 1 (Week 2): STG=LTG due to LOS  Skilled Therapeutic Interventions/Progress Updates: Patient received resting in bed. Agreeable and motivated to participate with OT treatment. Patient requesting to use the restroom before therapy session. Patient able to get OOB and ambulate into the bathroom with supervision. Supervision for hand hygiene at the sink. Patient ambulated with the RW to the therapy gym for BUE strengthening  exercises. Working with a dowel to focus on equal use of BUE's worked in varied planes 2 x 10 each with good endurance using a 3 # dowel. Continued treatment in ADL kitchen using 4# ball for overhead reaching in standing to challenge UE strength, coordination and dynamic balance. Patient with good use of LUE but needed some cuing to perform the reach equally with BUE's. Continued treatment session with IADL simulation and education on safety and simplification. Good tolerance of dynamic standing reaching to varied height cabinets and carrying items in the kitchen from one counter to another. Patient reports having grandson living in the home that can help to gather any items that are difficult to reach upon discharge. Good response to education and motivation to return home at the highest level of independence possible. Continue with skilled OT POC.      Therapy Documentation Precautions:  Precautions Precautions: Fall Precaution/Restrictions Comments: L hemiparesis Restrictions Weight Bearing Restrictions Per Provider Order: No     Pain: no report of pain      Therapy/Group: Individual Therapy  Monica Austin 07/05/2024, 12:23 PM

## 2024-07-05 NOTE — Progress Notes (Signed)
 Physical Therapy Session Note  Patient Details  Name: Monica Austin MRN: [id number] Date of Birth: 03/03/1956  Today's Date: 07/05/2024 PT Individual Time: 0815-0858 PT Individual Time Calculation (min): 43 min   Short Term Goals: Week 2:  PT Short Term Goal 1 (Week 2): STG = LTG due to ELOS  Skilled Therapeutic Interventions/Progress Updates:      Pt presents in bed to start and in agreement to therapy treatment. She has no complaints of pain and is dressed and ready for PT session.   Bed mobility completed at supervision level. She don's slip-on shoes at EOB with setupA. Sit<>Stand to RW with supervision from lowered EOB height. Ambulated with CGA and RW from her room to the main gym, ~149ft, with safety cues for awareness of her speed and to keep her body upright within the walker frame.   Pt completed standing there-ex in // bars while using the mirror for visual feedback: -1x20 bilateral heel raises with fingertip support for balance -1x15 partial squats (limited by B knee pain in deep flexion) -1x15 hip abd/add with finger tip support for balance. Cues for avoiding trunk lateral flexion for compensatory strategies.  -lateral stepping over 6 hurdles with 7.5# ankle weight on LLE with BUE support on // bar 9x3 -single leg hip flexion isometric holds with hip/kne at 90/90 and 7.5# ankle weight on LLE 2x10 *patient requiring cues for sequencing, postural awareness, and technique to isolate muscle strengthening and NMR.   Pt returned to her room and ended session in bed with needs met. Alarm on. Pt made aware of upcoming therapy schedule.   Therapy Documentation Precautions:  Precautions Precautions: Fall Precaution/Restrictions Comments: L hemiparesis Restrictions Weight Bearing Restrictions Per Provider Order: No General:    Therapy/Group: Individual Therapy  Toniann Dickerson P Lary Eckardt 07/05/2024, 7:10 AM

## 2024-07-05 NOTE — Progress Notes (Signed)
 PROGRESS NOTE   Subjective/Complaints: Pt in good spirits. Feels well. Asked about  Pt reports LBM Thursday- that's normal for her. Slept well  Denies pain Main concern is wasn't on Insulin  before admission and would prefer to not go home on it- also, doesn't have PCP!!!  ROS:   Pt denies SOB, abd pain, CP, N/V/C/D, and vision changes  Objective:   No results found.  Recent Labs    07/03/24 0559  WBC 5.8  HGB 11.9*  HCT 35.3*  PLT 238   Recent Labs    07/03/24 0559  NA 140  K 3.8  CL 107  CO2 26  GLUCOSE 183*  BUN 19  CREATININE 0.87  CALCIUM  9.4    Intake/Output Summary (Last 24 hours) at 07/05/2024 0959 Last data filed at 07/05/2024 0813 Gross per 24 hour  Intake 814 ml  Output --  Net 814 ml        Physical Exam: Vital Signs Blood pressure 111/66, pulse (!) 58, temperature 98.4 F (36.9 C), temperature source Oral, resp. rate 17, height 5' 7 (1.702 m), weight 82.2 kg, SpO2 99%.    General: awake, alert, appropriate, sitting up in bed; NAD HENT: conjugate gaze; oropharynx a little dry CV: regular rate/borderline bradycardia; regular rhythm; no JVD Pulmonary: CTA B/L; no W/R/R- good air movement GI: soft, NT, ND, (+)BS- slightly hypoactive Psychiatric: appropriate but flat-  Neurological: Ox3 Ext: no clubbing, cyanosis, or edema Psych: pleasant and cooperative, more dynamic! Skin: intact Musculoskeletal:     Cervical back: Neck supple. No tenderness.      Neuro: RUE 5/5 thorughout LUE- ~4/5 in all muscles except FA which is 2+ to 3-/5 RLE- 5/5 throughout LLE- 4+ throughout, stable 7/11    Comments: Left facial weakness with mild dysarthria. Able to answer orientation questions without difficulty and follow simple one and two step commands. Fair insight and awareness. Oriented to hospital, month, aware of situation  Intact to light touch in all 4 extremities      Assessment/Plan: 1.  Functional deficits which require 3+ hours per day of interdisciplinary therapy in a comprehensive inpatient rehab setting. Physiatrist is providing close team supervision and 24 hour management of active medical problems listed below. Physiatrist and rehab team continue to assess barriers to discharge/monitor patient progress toward functional and medical goals  Care Tool:  Bathing    Body parts bathed by patient: Left upper leg, Right arm, Left arm, Right lower leg, Chest, Abdomen, Left lower leg, Face, Front perineal area, Right upper leg, Buttocks   Body parts bathed by helper: Buttocks     Bathing assist Assist Level: Supervision/Verbal cueing     Upper Body Dressing/Undressing Upper body dressing   What is the patient wearing?: Pull over shirt    Upper body assist Assist Level: Set up assist    Lower Body Dressing/Undressing Lower body dressing      What is the patient wearing?: Pants, Underwear/pull up     Lower body assist Assist for lower body dressing: Supervision/Verbal cueing     Toileting Toileting    Toileting assist Assist for toileting: Contact Guard/Touching assist     Transfers Chair/bed transfer  Transfers  assist     Chair/bed transfer assist level: Contact Guard/Touching assist     Locomotion Ambulation   Ambulation assist      Assist level: Contact Guard/Touching assist Assistive device: Walker-rolling Max distance: 150'   Walk 10 feet activity   Assist     Assist level: Contact Guard/Touching assist Assistive device: Walker-rolling   Walk 50 feet activity   Assist    Assist level: Contact Guard/Touching assist Assistive device: Walker-rolling    Walk 150 feet activity   Assist Walk 150 feet activity did not occur:  (fatigue)  Assist level: Contact Guard/Touching assist Assistive device: Walker-rolling    Walk 10 feet on uneven surface  activity   Assist     Assist level: Contact Guard/Touching  assist Assistive device: Walker-rolling   Wheelchair     Assist Is the patient using a wheelchair?: No Type of Wheelchair: Manual    Wheelchair assist level: Dependent - Patient 0%      Wheelchair 50 feet with 2 turns activity    Assist        Assist Level: Dependent - Patient 0%   Wheelchair 150 feet activity     Assist      Assist Level: Dependent - Patient 0%   Blood pressure 111/66, pulse (!) 58, temperature 98.4 F (36.9 C), temperature source Oral, resp. rate 17, height 5' 7 (1.702 m), weight 82.2 kg, SpO2 99%.  Medical Problem List and Plan: 1. 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  Antithrombotics: -DVT/anticoagulation:  Pharmaceutical: continue Lovenox              -antiplatelet therapy: DAPT X 3 months followed by ASA  3. Pain Management: tylenol  prn.  4. Mood/Behavior/Sleep: LCSW to follow for evaluation and support.              -antipsychotic agents: N/A 5. Neuropsych/cognition: This patient is capable of making decisions on her own behalf. 6. Skin/Wound Care: Routine pressure relief measures.  7. Fluids/Electrolytes/Nutrition: improved po  -I personally reviewed the patient's labs today.   8. E coli UTI: Keflex  antibiotic  Done with Keflex  for UTI 9. Hypotension fluctuating with hypertension: decrease Toprol  XL to 12.5mg  daily. Avoid hypotension with evidence of intracranial stenosis. Add magnesium  supplement            7/11 bp controlled  7/12- BP controlled except 1 BP of 140/71- con't regimen 10.  T2DM: Hgb A1c- 9.7. Monitor BS ac/hs and use SSI for elevated BS. Add CM restrictions to diet.             --Was on metformin  PTA with poor control.  --now on Insulin   glargline. decrease SSI to sensitive.               --RD consulted for education on carb modified diet and Rehab RN for insulin  education.   -provide list of foods for diabetes  -CBGs reviewed  7/11-stopped metformin  d/t nausea--cbg's increasing a bit now that she's eating more as well.    -  began low dose glucotrol ---increased to 5mg  xl 7/10   -continue same semglee    -will need further titration of glucotrol --increase to 10mg  xl effective 7/12  7/12- pt doesn't want to go home on insulin , but explained will probably need some until her BG's become better controlled- will need to f/u with PCP- Cbg's still in low 200's- glucotrol  started this AM- cannot titrate dose yet CBG (last 3)  Recent Labs    07/04/24 1655 07/04/24 2056 07/05/24 0553  GLUCAP 215* 226* 147*    11. Dyslipidemia: continue Crestor .   12.  Mild intermittent asthma: continue Singulair   13. Thyroid nodules/enlargement: Ultrasound for work up after discharge.   14. Nausea:d/t metformin ---stopped  - resolved   -push po/fluids.  15. Fibroid uterus: refer for outpatient OB/GYN f/u   I spent a total of  36  minutes on total care today- >50% coordination of care- due to  D/w nursing about pt overnight as well as with pt about her insulin - reviewed chart- cannot remove at this time-needs PCP  LOS: 9 days A FACE TO FACE EVALUATION WAS PERFORMED  Monica Austin 07/05/2024, 9:59 AM

## 2024-07-05 NOTE — Progress Notes (Signed)
 Occupational Therapy Session Note  Patient Details  Name: Monica Austin MRN: [id number] Date of Birth: [birth year]  Today's Date: 07/05/2024 OT Individual Time: 8651-8567 OT Individual Time Calculation (min): 44 min    Short Term Goals: Week 2:  OT Short Term Goal 1 (Week 2): STG=LTG due to LOS  Skilled Therapeutic Interventions/Progress Updates:    Patient agreeable to participate in OT session. Reports no pain level.   Patient participated in skilled OT session focusing on dressing and functional mobility. Patient completed LB dressing set up assist with increased time. Patient requested to complete outdoor activity today. OT facilitated increased mood and safety in home with outdoor activity walking with RW on uneven ground with cracks to elicit increased safety response and awareness. Patient able to verbalize deficits. Family educated on AE and needs for DC.  Patient returned to room with needs in reach, alarm on.   Therapy Documentation Precautions:  Precautions Precautions: Fall Precaution/Restrictions Comments: L hemiparesis Restrictions Weight Bearing Restrictions Per Provider Order: No   Therapy/Group: Individual Therapy  Monica Austin 07/05/2024, 3:15 PM

## 2024-07-06 DIAGNOSIS — I63511 Cerebral infarction due to unspecified occlusion or stenosis of right middle cerebral artery: Secondary | ICD-10-CM | POA: Diagnosis not present

## 2024-07-06 LAB — GLUCOSE, CAPILLARY
Glucose-Capillary: 159 mg/dL — ABNORMAL HIGH (ref 70–99)
Glucose-Capillary: 171 mg/dL — ABNORMAL HIGH (ref 70–99)
Glucose-Capillary: 140 mg/dL — ABNORMAL HIGH (ref 70–99)
Glucose-Capillary: 218 mg/dL — ABNORMAL HIGH (ref 70–99)

## 2024-07-06 NOTE — Progress Notes (Signed)
 PROGRESS NOTE   Subjective/Complaints:  Pt reports feeling OK- no issues.  Per chart, no BM since 7/10- she denies constipation.   ROS:    Pt denies SOB, abd pain, CP, N/V/C/D, and vision changes   Objective:   No results found.  No results for input(s): WBC, HGB, HCT, PLT in the last 72 hours.  No results for input(s): NA, K, CL, CO2, GLUCOSE, BUN, CREATININE, CALCIUM  in the last 72 hours.   Intake/Output Summary (Last 24 hours) at 07/06/2024 1000 Last data filed at 07/06/2024 0802 Gross per 24 hour  Intake 1072 ml  Output --  Net 1072 ml        Physical Exam: Vital Signs Blood pressure (!) 122/53, pulse 62, temperature 98.4 F (36.9 C), temperature source Oral, resp. rate 18, height 5' 7 (1.702 m), weight 82.2 kg, SpO2 95%.     General: asleep, woke to stimuli; supine in bed; NAD HENT: conjugate gaze; oropharynx moist CV: regular rate and rhythm; no JVD Pulmonary: CTA B/L; no W/R/R- good air movement GI: soft, NT, ND, (+)BS Psychiatric: appropriate- but sleepy Neurological: sleepy- just woke her up  Ext: no clubbing, cyanosis, or edema  Skin: intact Musculoskeletal:     Cervical back: Neck supple. No tenderness.      Neuro: RUE 5/5 thorughout LUE- ~4/5 in all muscles except FA which is 2+ to 3-/5 RLE- 5/5 throughout LLE- 4+ throughout, stable 7/11    Comments: Left facial weakness with mild dysarthria. Able to answer orientation questions without difficulty and follow simple one and two step commands. Fair insight and awareness. Oriented to hospital, month, aware of situation  Intact to light touch in all 4 extremities      Assessment/Plan: 1. Functional deficits which require 3+ hours per day of interdisciplinary therapy in a comprehensive inpatient rehab setting. Physiatrist is providing close team supervision and 24 hour management of active medical problems listed  below. Physiatrist and rehab team continue to assess barriers to discharge/monitor patient progress toward functional and medical goals  Care Tool:  Bathing    Body parts bathed by patient: Left upper leg, Right arm, Left arm, Right lower leg, Chest, Abdomen, Left lower leg, Face, Front perineal area, Right upper leg, Buttocks   Body parts bathed by helper: Buttocks     Bathing assist Assist Level: Supervision/Verbal cueing     Upper Body Dressing/Undressing Upper body dressing   What is the patient wearing?: Pull over shirt    Upper body assist Assist Level: Set up assist    Lower Body Dressing/Undressing Lower body dressing      What is the patient wearing?: Pants, Underwear/pull up     Lower body assist Assist for lower body dressing: Supervision/Verbal cueing     Toileting Toileting    Toileting assist Assist for toileting: Contact Guard/Touching assist     Transfers Chair/bed transfer  Transfers assist     Chair/bed transfer assist level: Contact Guard/Touching assist     Locomotion Ambulation   Ambulation assist      Assist level: Contact Guard/Touching assist Assistive device: Walker-rolling Max distance: 150'   Walk 10 feet activity   Assist  Assist level: Contact Guard/Touching assist Assistive device: Walker-rolling   Walk 50 feet activity   Assist    Assist level: Contact Guard/Touching assist Assistive device: Walker-rolling    Walk 150 feet activity   Assist Walk 150 feet activity did not occur:  (fatigue)  Assist level: Contact Guard/Touching assist Assistive device: Walker-rolling    Walk 10 feet on uneven surface  activity   Assist     Assist level: Contact Guard/Touching assist Assistive device: Walker-rolling   Wheelchair     Assist Is the patient using a wheelchair?: No Type of Wheelchair: Manual    Wheelchair assist level: Dependent - Patient 0%      Wheelchair 50 feet with 2 turns  activity    Assist        Assist Level: Dependent - Patient 0%   Wheelchair 150 feet activity     Assist      Assist Level: Dependent - Patient 0%   Blood pressure (!) 122/53, pulse 62, temperature 98.4 F (36.9 C), temperature source Oral, resp. rate 18, height 5' 7 (1.702 m), weight 82.2 kg, SpO2 95%.  Medical Problem List and Plan: 1. Functional deficits secondary to R BG stroke/R MCA stroke             -patient may  shower             -ELOS/Goals: 10-14 days- supervision to mod I- needs PT, OT and SLP             Grounds pass ordered    Might benefit from kick plate for right shoe vs AFO  Con't CIR PT and OT  Pt DOESN'T have PCP- needs one 2.  Antithrombotics: -DVT/anticoagulation:  Pharmaceutical: continue Lovenox              -antiplatelet therapy: DAPT X 3 months followed by ASA  3. Pain Management: tylenol  prn.  4. Mood/Behavior/Sleep: LCSW to follow for evaluation and support.              -antipsychotic agents: N/A 5. Neuropsych/cognition: This patient is capable of making decisions on her own behalf. 6. Skin/Wound Care: Routine pressure relief measures.  7. Fluids/Electrolytes/Nutrition: improved po  -I personally reviewed the patient's labs today.   8. E coli UTI: Keflex  antibiotic  Done with Keflex  for UTI 9. Hypotension fluctuating with hypertension: decrease Toprol  XL to 12.5mg  daily. Avoid hypotension with evidence of intracranial stenosis. Add magnesium  supplement            7/11 bp controlled  7/12-7/13-  BP controlled except 1 BP of 140/71- con't regimen 10.  T2DM: Hgb A1c- 9.7. Monitor BS ac/hs and use SSI for elevated BS. Add CM restrictions to diet.             --Was on metformin  PTA with poor control.  --now on Insulin  glargline. decrease SSI to sensitive.               --RD consulted for education on carb modified diet and Rehab RN for insulin  education.   -provide list of foods for diabetes  -CBGs reviewed  7/11-stopped metformin  d/t  nausea--cbg's increasing a bit now that she's eating more as well.    -began low dose glucotrol ---increased to 5mg  xl 7/10   -continue same semglee    -will need further titration of glucotrol --increase to 10mg  xl effective 7/12  7/12- pt doesn't want to go home on insulin , but explained will probably need some until her BG's become better controlled- will need  to f/u with PCP- Cbg's still in low 200's- glucotrol  started this AM- cannot titrate dose yet  7/13- BG's looking better overall- con't regimen CBG (last 3)  Recent Labs    07/05/24 1648 07/05/24 2049 07/06/24 0602  GLUCAP 191* 264* 159*    11. Dyslipidemia: continue Crestor .   12.  Mild intermittent asthma: continue Singulair   13. Thyroid nodules/enlargement: Ultrasound for work up after discharge.   14. Nausea:d/t metformin ---stopped  - resolved   -push po/fluids.  15. Fibroid uterus: refer for outpatient OB/GYN f/u    LOS: 10 days A FACE TO FACE EVALUATION WAS PERFORMED  Momin Misko 07/06/2024, 10:00 AM

## 2024-07-07 LAB — BASIC METABOLIC PANEL WITH GFR
Anion gap: 6 (ref 5–15)
BUN: 16 mg/dL (ref 8–23)
CO2: 23 mmol/L (ref 22–32)
Calcium: 8.9 mg/dL (ref 8.9–10.3)
Chloride: 109 mmol/L (ref 98–111)
Creatinine, Ser: 0.87 mg/dL (ref 0.44–1.00)
GFR, Estimated: >60 mL/min (ref >60)
Glucose, Bld: 278 mg/dL — ABNORMAL HIGH (ref 70–99)
Potassium: 4 mmol/L (ref 3.5–5.1)
Sodium: 138 mmol/L (ref 135–145)

## 2024-07-07 LAB — CBC
HCT: 33.3 % — ABNORMAL LOW (ref 36.0–46.0)
Hemoglobin: 11.4 g/dL — ABNORMAL LOW (ref 12.0–15.0)
MCH: 28.3 pg (ref 26.0–34.0)
MCHC: 34.2 g/dL (ref 30.0–36.0)
MCV: 82.6 fL (ref 80.0–100.0)
Platelets: 234 K/uL (ref 150–400)
RBC: 4.03 MIL/uL (ref 3.87–5.11)
RDW: 12.9 % (ref 11.5–15.5)
WBC: 5.9 K/uL (ref 4.0–10.5)
nRBC: 0 % (ref 0.0–0.2)

## 2024-07-07 LAB — OCCULT BLOOD X 1 CARD TO LAB, STOOL: Fecal Occult Bld: NEGATIVE

## 2024-07-07 LAB — GLUCOSE, CAPILLARY
Glucose-Capillary: 239 mg/dL — ABNORMAL HIGH (ref 70–99)
Glucose-Capillary: 142 mg/dL — ABNORMAL HIGH (ref 70–99)
Glucose-Capillary: 204 mg/dL — ABNORMAL HIGH (ref 70–99)
Glucose-Capillary: 150 mg/dL — ABNORMAL HIGH (ref 70–99)

## 2024-07-07 MED ORDER — L-METHYLFOLATE-B6-B12 3-35-2 MG PO TABS
1.0000 | ORAL_TABLET | Freq: Every day | ORAL | Status: DC
  Administered 2024-07-07 – 2024-07-11 (×5): 1 via ORAL
  Filled 2024-07-07 (×5): qty 1

## 2024-07-07 MED ORDER — INSULIN GLARGINE-YFGN 100 UNIT/ML ~~LOC~~ SOLN
13.0000 [IU] | Freq: Every day | SUBCUTANEOUS | Status: DC
  Administered 2024-07-07 – 2024-07-08 (×2): 13 [IU] via SUBCUTANEOUS
  Filled 2024-07-07 (×2): qty 0.13

## 2024-07-07 MED ORDER — VITAMIN D 25 MCG (1000 UNIT) PO TABS
2000.0000 [IU] | ORAL_TABLET | Freq: Every day | ORAL | Status: DC
  Administered 2024-07-07 – 2024-07-11 (×5): 2000 [IU] via ORAL
  Filled 2024-07-07 (×6): qty 2

## 2024-07-07 MED ORDER — INSULIN GLARGINE-YFGN 100 UNIT/ML ~~LOC~~ SOLN: 13.0000 [IU] | Freq: Every day | SUBCUTANEOUS | Status: DC

## 2024-07-07 NOTE — Progress Notes (Signed)
 Speech Language Pathology Daily Session Note  Patient Details  Name: Monica Austin MRN: [id number] Date of Birth: [birth year]  Today's Date: 07/07/2024 SLP Individual Time: 1000-1103 SLP Individual Time Calculation (min): 63 min  Short Term Goals: Week 2: SLP Short Term Goal 1 (Week 2): STGs=LTGs d/t ELOS  Skilled Therapeutic Interventions:  Patient was seen in am to address cognitive re- training. Pt was alert and seen at bedside. She denied pain and was agreeable for session. SLP engaged pt in rapport building through life review as well as discussion on recent medical hx and PLOF. Pt demonstrated intellectual awareness of cognitive deficits and confessed uncertainty regarding possibility of return to work. SLP initiated session through challenging pt in a working memory task. SLP instructed pt in repetition strategy and subsequently challenged pt in categorical exclusion task of 4 units with 83% acc min A and 5 units with 100% acc min A. In other minutes of session SLP reviewed WRAP compensatory strategies and examples of utilization. Given a moderate level paragraph presented verbally, SLP guided pt in utilization of repetition and association strategies. Given a 15 minute distracted delay, she recalled information with 100% acc indep. SLP also challenged recall of Be Fast stroke symptoms after a 5 minute delay she identified 2 symptoms with mod A. SLP challenged medication problem solving through having patient identify solutions to medical scenarios presented verbally. Pt identified an appropriate solution given min A. At conclusion of session pt was left upright in bed with call button within reach and bed alarm active. SLP to continue POC.   Pain Pain Assessment Pain Scale: 0-10 Pain Score: 0-No pain  Therapy/Group: Individual Therapy  Joane GORMAN Fuss 07/07/2024, 12:16 PM

## 2024-07-07 NOTE — Progress Notes (Signed)
 PROGRESS NOTE   Subjective/Complaints: No new complaints this morning Patient will have 24/7 supervision at home  Patient's chart reviewed- No issues reported overnight Vitals signs stable   ROS:    Pt denies SOB, abd pain, CP, N/V/C/D, and vision changes   Objective:   No results found.  Recent Labs    07/07/24 0514  WBC 5.9  HGB 11.4*  HCT 33.3*  PLT 234    Recent Labs    07/07/24 0514  NA 138  K 4.0  CL 109  CO2 23  GLUCOSE 278*  BUN 16  CREATININE 0.87  CALCIUM  8.9     Intake/Output Summary (Last 24 hours) at 07/07/2024 1217 Last data filed at 07/07/2024 0807 Gross per 24 hour  Intake 354 ml  Output --  Net 354 ml        Physical Exam: Vital Signs Blood pressure 137/67, pulse 78, temperature 98.3 F (36.8 C), temperature source Oral, resp. rate 16, height 5' 7 (1.702 m), weight 82.2 kg, SpO2 98%.     General: asleep, woke to stimuli; supine in bed; NAD HENT: conjugate gaze; oropharynx moist CV: regular rate and rhythm; no JVD Pulmonary: CTA B/L; no W/R/R- good air movement GI: soft, NT, ND, (+)BS Psychiatric: appropriate- but sleepy Neurological: sleepy- just woke her up  Ext: no clubbing, cyanosis, or edema  Skin: intact Musculoskeletal:     Cervical back: Neck supple. No tenderness.      Neuro: RUE 5/5 thorughout LUE- ~4/5 in all muscles except FA which is 2+ to 3-/5 RLE- 5/5 throughout LLE- 4+ throughout, stable 7/14    Comments: Left facial weakness with mild dysarthria. Able to answer orientation questions without difficulty and follow simple one and two step commands. Fair insight and awareness. Oriented to hospital, month, aware of situation  Intact to light touch in all 4 extremities Functional mobility: standing and ambulating S      Assessment/Plan: 1. Functional deficits which require 3+ hours per day of interdisciplinary therapy in a comprehensive inpatient  rehab setting. Physiatrist is providing close team supervision and 24 hour management of active medical problems listed below. Physiatrist and rehab team continue to assess barriers to discharge/monitor patient progress toward functional and medical goals  Care Tool:  Bathing    Body parts bathed by patient: Left upper leg, Right arm, Left arm, Right lower leg, Chest, Abdomen, Left lower leg, Face, Front perineal area, Right upper leg, Buttocks   Body parts bathed by helper: Buttocks     Bathing assist Assist Level: Supervision/Verbal cueing     Upper Body Dressing/Undressing Upper body dressing   What is the patient wearing?: Pull over shirt    Upper body assist Assist Level: Set up assist    Lower Body Dressing/Undressing Lower body dressing      What is the patient wearing?: Pants, Underwear/pull up     Lower body assist Assist for lower body dressing: Supervision/Verbal cueing     Toileting Toileting Toileting Activity did not occur (Clothing management and hygiene only): N/A (no void or bm)  Toileting assist Assist for toileting: Contact Guard/Touching assist     Transfers Chair/bed transfer  Transfers assist  Chair/bed transfer assist level: Contact Guard/Touching assist     Locomotion Ambulation   Ambulation assist      Assist level: Contact Guard/Touching assist Assistive device: Walker-rolling Max distance: 150'   Walk 10 feet activity   Assist     Assist level: Contact Guard/Touching assist Assistive device: Walker-rolling   Walk 50 feet activity   Assist    Assist level: Contact Guard/Touching assist Assistive device: Walker-rolling    Walk 150 feet activity   Assist Walk 150 feet activity did not occur:  (fatigue)  Assist level: Contact Guard/Touching assist Assistive device: Walker-rolling    Walk 10 feet on uneven surface  activity   Assist     Assist level: Contact Guard/Touching assist Assistive device:  Walker-rolling   Wheelchair     Assist Is the patient using a wheelchair?: No Type of Wheelchair: Manual    Wheelchair assist level: Dependent - Patient 0%      Wheelchair 50 feet with 2 turns activity    Assist        Assist Level: Dependent - Patient 0%   Wheelchair 150 feet activity     Assist      Assist Level: Dependent - Patient 0%   Blood pressure 137/67, pulse 78, temperature 98.3 F (36.8 C), temperature source Oral, resp. rate 16, height 5' 7 (1.702 m), weight 82.2 kg, SpO2 98%.  Medical Problem List and Plan: 1. Functional deficits secondary to R BG stroke/R MCA stroke             -patient may  shower             -ELOS/Goals: 10-14 days- supervision to mod I- needs PT, OT and SLP             Grounds pass ordered    Might benefit from kick plate for right shoe vs AFO  Con't CIR PT and OT  Pt DOESN'T have PCP- needs one  Metanx ordered  2.  Antithrombotics: -DVT/anticoagulation:  Pharmaceutical: continue Lovenox              -antiplatelet therapy: DAPT X 3 months followed by ASA  3. Pain Management: tylenol  prn.  4. Mood/Behavior/Sleep: LCSW to follow for evaluation and support.              -antipsychotic agents: N/A 5. Neuropsych/cognition: This patient is capable of making decisions on her own behalf. 6. Skin/Wound Care: Routine pressure relief measures.  7. Fluids/Electrolytes/Nutrition: improved po  -I personally reviewed the patient's labs today.   8. E coli UTI: Keflex  antibiotic  Done with Keflex  for UTI 9. Hypotension fluctuating with hypertension: decrease Toprol  XL to 12.5mg  daily. Avoid hypotension with evidence of intracranial stenosis. Add magnesium  supplement            7/11 bp controlled  7/12-7/13-  BP controlled except 1 BP of 140/71- con't regimen 10.  T2DM: Hgb A1c- 9.7. Monitor BS ac/hs and use SSI for elevated BS. Add CM restrictions to diet.             --Was on metformin  PTA with poor control.  --now on Insulin   glargline. decrease SSI to sensitive.               --RD consulted for education on carb modified diet and Rehab RN for insulin  education.   -provide list of foods for diabetes  -CBGs reviewed  7/11-stopped metformin  d/t nausea--cbg's increasing a bit now that she's eating more as well.    -began  low dose glucotrol ---increased to 5mg  xl 7/10   -continue same semglee    -will need further titration of glucotrol --increase to 10mg  xl effective 7/12  7/12- pt doesn't want to go home on insulin , but explained will probably need some until her BG's become better controlled- will need to f/u with PCP- Cbg's still in low 200's- glucotrol  started this AM- cannot titrate dose yet  Increase insulin  to 12U CBG (last 3)  Recent Labs    07/06/24 2019 07/07/24 0529 07/07/24 1203  GLUCAP 218* 239* 142*    11. Dyslipidemia: continue Crestor .   12.  Mild intermittent asthma: continue Singulair   13. Thyroid nodules/enlargement: Ultrasound for work up after discharge.   14. Nausea:d/t metformin ---stopped  - resolved   -push po/fluids.  15. Fibroid uterus: refer for outpatient OB/GYN f/u    LOS: 11 days A FACE TO FACE EVALUATION WAS PERFORMED  Monica Austin Monica Austin 07/07/2024, 12:17 PM

## 2024-07-07 NOTE — Progress Notes (Signed)
 Patient ID: Monica Austin, female   DOB: 10-24-1956, 68 y.o.   MRN: [id number] Spoke with daughter to discuss discharge plans they have arranged several family members to assist pt at home and she will have 24/7. Daughter is trying to take off Friday and come Thursday evening and see her in therapies Friday. Have told her can schedule some therapies Friday morning if needed. Daughter to let worker know if will be doing this. Discussed what insurance does cover and will go ahead and arrange home health services for home. Pt is doing better and progressing but will still need supervision at discharge.

## 2024-07-07 NOTE — Plan of Care (Signed)
  Problem: Consults Goal: RH STROKE PATIENT EDUCATION Description: See Patient Education module for education specifics  Outcome: Progressing Goal: Diabetes Guidelines if Diabetic/Glucose > 140 Description: If diabetic or lab glucose is > 140 mg/dl - Initiate Diabetes/Hyperglycemia Guidelines & Document Interventions  Outcome: Progressing   Problem: RH BLADDER ELIMINATION Goal: RH STG MANAGE BLADDER WITH ASSISTANCE Description: STG Manage Bladder With toileting Assistance Outcome: Progressing

## 2024-07-07 NOTE — Progress Notes (Signed)
 Physical Therapy Session Note  Patient Details  Name: Monica Austin MRN: [id number] Date of Birth: [birth year]  Today's Date: 07/07/2024 PT Individual Time: 1117-1202 PT Individual Time Calculation (min): 45 min   Short Term Goals: Week 1:  PT Short Term Goal 1 (Week 1): pt will ambulate 150 feet with LRAD and CGA PT Short Term Goal 1 - Progress (Week 1): Met PT Short Term Goal 2 (Week 1): pt will perform bed to chair transfer with LRAD and CGA PT Short Term Goal 2 - Progress (Week 1): Met PT Short Term Goal 3 (Week 1): pt will navigate 12 steps with LRAD and CGA PT Short Term Goal 3 - Progress (Week 1): Met Week 2:  PT Short Term Goal 1 (Week 2): STG = LTG due to ELOS  Skilled Therapeutic Interventions/Progress Updates:  Patient supine in bed on entrance to room. Patient alert and agreeable to PT session.   Patient with no pain complaint at start of session.  Therapeutic Activity: Bed Mobility: Pt performed supine > sit with supervision and use of bed features with extra effort. VC/ tc required for minimal technique. Recommended purchase of rail for home bed setup.  Transfers: Pt performed sit<>stand transfers throughout session with supervision/ CGA and stand pivot transfers throughout session with SBA/ CGA. Provided vc/ tc for minimal technique and balance attainment.   Gait Training:  Pt ambulated >100 ft to/from ortho gym using RW with CGA for balance and path deviations/ missteps. Noted NBOS with intermittent crossover stepping. Provided vc/ tc for correcting NBOS.  Resisted forward and backward ambulation - no AD - in order to improve step quality with increased foot clearance required for LLE. Is able to perform well with improved foot clearance and overall balance d/t widened BOS.   Guided in gait training without use of RW over 260ft. Introduced head turns with noted increase in path deviation and increased NBOS stepping patterns. Provided with vc and able to self correct  for short periods. Also noted intermittent decreased awareness of obstacles on L side on approach. Requires vc to increase awareness and avoid obstacles.   Neuromuscular Re-ed: NMR facilitated during session with focus on standing balance and cognition. Pt guided first in standing balance at [facility]. Is able to perform single target task well with 2sec reaction time. With use of LUE, accuracy decreases. Then guided in scattered array of alphabet with addition of central fixation. Pt is not abl eto attend to central fixation changes even when flashing and requires vc to continue throughout. Is aware of alphabet and only requires minimal cues for memory of next sequential letter following attending to central fixation as she gets deeper into alphabet. Is able to scan entire screen for letters.   NMR performed for improvements in motor control and coordination, balance, sequencing, judgement, and self confidence/ efficacy in performing all aspects of mobility at highest level of independence.   On return to room, pt toilets with supervision and need for MinA with clothing mgmt - especially on Lside d/t reduced awareness.   Patient supine in bed at end of session with brakes locked, bed alarm set, and all needs within reach.   Therapy Documentation Precautions:  Precautions Precautions: Fall Precaution/Restrictions Comments: L hemiparesis Restrictions Weight Bearing Restrictions Per Provider Order: No General:   Vital Signs:   Pain: Pain Assessment Pain Scale: 0-10 Pain Score: 0-No pain Mobility:   Locomotion :    Trunk/Postural Assessment :    Balance:   Exercises:  Other Treatments:      Therapy/Group: Individual Therapy  Mliss DELENA Milliner PT, DPT, CSRS 07/07/2024, 1:01 PM

## 2024-07-07 NOTE — Progress Notes (Signed)
 Occupational Therapy Session Note  Patient Details  Name: Monica Austin MRN: [id number] Date of Birth: [birth year]  Today's Date: 07/07/2024 OT Individual Time: 0803- 0915 OT Individual Treatment Time:  72 min      Short Term Goals: Week 2:  OT Short Term Goal 1 (Week 2): STG=LTG due to LOS  Skilled Therapeutic Interventions/Progress Updates:   Patient received seated in recliner.  Patient reports weekend was long.  Patient set up for breakfast and ate independently.  Patient walked to bathroom without device with contact guard and cueing and facilitation for upright posture, wider base of support, and slowed pace.  Patient showered seated with set up assistance/ distant supervision. Patient needs cueing and support for logical steps of BADL, e.g. deodorant, oral care, hair care, etc.  Reviewed with patient need for tub transfer bench - patient very excited to have son purchase so she has when she returns home.  Patient indicates daughter will be staying with her for a few weeks at discharge.  Patient walked to gym with emphasis on postural control, upright trunk, hip extension, and reduced sway.  In gym focused on gross motor coordiantion and reaction time.  Patient with significant delay in motor response in LUE.  Returned to room, and patient requesting back to bed.  Left in bed with alarm engaged and call bell/ personal items in reach.    Therapy Documentation Precautions:  Precautions Precautions: Fall Precaution/Restrictions Comments: L hemiparesis Restrictions Weight Bearing Restrictions Per Provider Order: No  Pain:  Denies pain   Therapy/Group: Individual Therapy  Demetrion Wesby M 07/07/2024, 8:28 AM

## 2024-07-07 NOTE — Progress Notes (Signed)
 Physical Therapy Session Note  Patient Details  Name: Monica Austin MRN: [id number] Date of Birth: 18-Jan-1956  Today's Date: 07/07/2024 PT Individual Time: 8583-8557 PT Individual Time Calculation (min): 26 min   Short Term Goals: Week 2:  PT Short Term Goal 1 (Week 2): STG = LTG due to ELOS  Skilled Therapeutic Interventions/Progress Updates:      Pt asleep in bed upon arrival. Pt agreeable to therapy. Pt denies any pain.   Pt ambulated with no AD ~120 feet with CGA/min A, verbal cues provided for correction of narrow BOS, and heel toe pattern, and safety with L LE with navigating turns.   Pt ambualted with use of RW in room with CGA/close supervision, verbal cues provided for safety with RW with navigating narrow spaces in room.   Pt continent of bowel. Notified nurse as nurse requesting stool sample.   Pt supine in bed with all needs within reach and bed alarm on.   Therapy Documentation Precautions:  Precautions Precautions: Fall Precaution/Restrictions Comments: L hemiparesis Restrictions Weight Bearing Restrictions Per Provider Order: No     Therapy/Group: Individual Therapy  [facility] Doreene Orris, [place], DPT  07/07/2024, 12:42 PM

## 2024-07-07 NOTE — Plan of Care (Signed)
  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   Problem: RH Bathing Goal: LTG Patient will bathe all body parts with assist levels (OT) Description: LTG: Patient will bathe all body parts with assist levels (OT) Flowsheets (Taken 07/07/2024 1204) LTG: Pt will perform bathing with assistance level/cueing: Supervision/Verbal cueing LTG: Position pt will perform bathing: Shower   Problem: RH Dressing Goal: LTG Patient will perform lower body dressing w/assist (OT) Description: LTG: Patient will perform lower body dressing with assist, with/without cues in positioning using equipment (OT) Flowsheets (Taken 07/07/2024 1204) LTG: Pt will perform lower body dressing with assistance level of: Set up assist   Problem: RH Toileting Goal: LTG Patient will perform toileting task (3/3 steps) with assistance level (OT) Description: LTG: Patient will perform toileting task (3/3 steps) with assistance level (OT)  Flowsheets (Taken 07/07/2024 1204) LTG: Pt will perform toileting task (3/3 steps) with assistance level: Supervision/Verbal cueing   Problem: RH Functional Use of Upper Extremity Goal: LTG Patient will use RT/LT upper extremity as a (OT) Description: LTG: Patient will use right/left upper extremity as a stabilizer/gross assist/diminished/nondominant/dominant level with assist, with/without cues during functional activity (OT) Flowsheets (Taken 07/07/2024 1204) LTG: Use of upper extremity in functional activities: LUE as dominant level LTG: Pt will use upper extremity in functional activity with assistance level of: Supervision/Verbal cueing   Problem: RH Toilet  Transfers Goal: LTG Patient will perform toilet transfers w/assist (OT) Description: LTG: Patient will perform toilet transfers with assist, with/without cues using equipment (OT) Flowsheets (Taken 07/07/2024 1204) LTG: Pt will perform toilet transfers with assistance level of: Supervision/Verbal cueing   Problem: RH Tub/Shower Transfers Goal: LTG Patient will perform tub/shower transfers w/assist (OT) Description: LTG: Patient will perform tub/shower transfers with assist, with/without cues using equipment (OT) Flowsheets (Taken 07/07/2024 1204) LTG: Pt will perform tub/shower stall transfers with assistance level of: Supervision/Verbal cueing LTG: Pt will perform tub/shower transfers from: Tub/shower combination   Problem: RH Memory Goal: LTG Patient will demonstrate ability for day to day recall/carry over during activities of daily living with assistance level (OT) Description: LTG:  Patient will demonstrate ability for day to day recall/carry over during activities of daily living with assistance level (OT). Flowsheets (Taken 07/07/2024 1204) LTG:  Patient will demonstrate ability for day to day recall/carry over during activities of daily living with assistance level (OT): Supervision   Problem: RH Awareness Goal: LTG: Patient will demonstrate awareness during functional activites type of (OT) Description: LTG: Patient will demonstrate awareness during functional activites type of (OT) Flowsheets (Taken 07/07/2024 1204) Patient will demonstrate awareness during functional activites type of: Emergent LTG: Patient will demonstrate awareness during functional activites type of (OT): Supervision

## 2024-07-08 DIAGNOSIS — I69919 Unspecified symptoms and signs involving cognitive functions following unspecified cerebrovascular disease: Secondary | ICD-10-CM

## 2024-07-08 LAB — CBC WITH DIFFERENTIAL/PLATELET
Abs Immature Granulocytes: 0.01 K/uL (ref 0.00–0.07)
Basophils Absolute: 0.1 K/uL (ref 0.0–0.1)
Basophils Relative: 1 %
Eosinophils Absolute: 0.2 K/uL (ref 0.0–0.5)
Eosinophils Relative: 3 %
HCT: 32.9 % — ABNORMAL LOW (ref 36.0–46.0)
Hemoglobin: 11.5 g/dL — ABNORMAL LOW (ref 12.0–15.0)
Immature Granulocytes: 0 %
Lymphocytes Relative: 45 %
Lymphs Abs: 3.1 K/uL (ref 0.7–4.0)
MCH: 28.8 pg (ref 26.0–34.0)
MCHC: 35 g/dL (ref 30.0–36.0)
MCV: 82.5 fL (ref 80.0–100.0)
Monocytes Absolute: 0.4 K/uL (ref 0.1–1.0)
Monocytes Relative: 6 %
Neutro Abs: 3 K/uL (ref 1.7–7.7)
Neutrophils Relative %: 45 %
Platelets: 228 K/uL (ref 150–400)
RBC: 3.99 MIL/uL (ref 3.87–5.11)
RDW: 13 % (ref 11.5–15.5)
WBC: 6.8 K/uL (ref 4.0–10.5)
nRBC: 0 % (ref 0.0–0.2)

## 2024-07-08 LAB — GLUCOSE, CAPILLARY
Glucose-Capillary: 158 mg/dL — ABNORMAL HIGH (ref 70–99)
Glucose-Capillary: 163 mg/dL — ABNORMAL HIGH (ref 70–99)
Glucose-Capillary: 261 mg/dL — ABNORMAL HIGH (ref 70–99)
Glucose-Capillary: 422 mg/dL — ABNORMAL HIGH (ref 70–99)
Glucose-Capillary: 291 mg/dL — ABNORMAL HIGH (ref 70–99)

## 2024-07-08 LAB — GLUCOSE, RANDOM: Glucose, Bld: 319 mg/dL — ABNORMAL HIGH (ref 70–99)

## 2024-07-08 MED ORDER — INSULIN GLARGINE-YFGN 100 UNIT/ML ~~LOC~~ SOLN
14.0000 [IU] | Freq: Every day | SUBCUTANEOUS | Status: DC
  Filled 2024-07-08: qty 0.14

## 2024-07-08 NOTE — Progress Notes (Signed)
 Speech Language Pathology Daily Session Note  Patient Details  Name: Monica Austin MRN: [id number] Date of Birth: 1956/04/03  Today's Date: 07/08/2024 SLP Individual Time: 0732-0812 SLP Individual Time Calculation (min): 40 min  Short Term Goals: Week 2: SLP Short Term Goal 1 (Week 2): STGs=LTGs d/t ELOS  Skilled Therapeutic Interventions: SLP conducted skilled therapy session targeting dysphagia and cognitive goals. Upon SLP entry, patient consuming Dys3/thin liquid breakfast tray items. Across meal, patient exhibiting reduced safety awareness requiring cues from SLP to assume upright positioning. Patient tolerated all items without overt s/sx of penetration/aspiration but continues to exhibit prolonged mastication orally. Further, patient exhibiting shoveling behavior requiring min cues to slow rate. Recommend continuation of current diet.  SLP then reviewed WRAP memory strategies with patient recalling 2/4 with supervision and requiring min assist for the remaining. Patient also requiring min assist to recall BE FAST stroke symptom list from previous session. SLP provided written handout to promote carryover of information. SLP then targeted thought organization and working memory task with patient benefiting from supervision to accurately sequence items. Patient was left in room with call bell in reach and alarm set. SLP will continue to target goals per plan of care.        Pain Pain Assessment Pain Scale: 0-10 Pain Score: 0-No pain  Therapy/Group: Individual Therapy  Jeanpaul Biehl, M.A., CCC-SLP  Alianny Toelle A Emet Rafanan 07/08/2024, 8:12 AM

## 2024-07-08 NOTE — Progress Notes (Signed)
 Physical Therapy Session Note  Patient Details  Name: Monica Austin MRN: [id number] Date of Birth: 1956/10/30  Today's Date: 07/08/2024 PT Individual Time: 8666-8570 PT Individual Time Calculation (min): 56 min   Short Term Goals: Week 1:  PT Short Term Goal 1 (Week 1): pt will ambulate 150 feet with LRAD and CGA PT Short Term Goal 1 - Progress (Week 1): Met PT Short Term Goal 2 (Week 1): pt will perform bed to chair transfer with LRAD and CGA PT Short Term Goal 2 - Progress (Week 1): Met PT Short Term Goal 3 (Week 1): pt will navigate 12 steps with LRAD and CGA PT Short Term Goal 3 - Progress (Week 1): Met Week 2:  PT Short Term Goal 1 (Week 2): STG = LTG due to ELOS  Skilled Therapeutic Interventions/Progress Updates:   Received pt sidelying in bed, pt agreeable to PT treatment, and denied any pain during session. Session with emphasis on functional mobility/transfers, generalized strengthening and endurance, dynamic standing balance/coordination, stair navigation, NMR, and ambulation. Pt performed bed mobility mod I x 2 trials throughout session and donned/doffed shoes with setup assist.   Pt performed all transfers with RW and CGA fading to close supervision throughout session. Pt ambulated 142ft with RW and CGA to main therapy gym - cues to increase L step length and height. Pt performed BERG - patient demonstrates increased fall risk as noted by score of 42/56 on Berg Balance Scale.  (<36= high risk for falls, close to 100%; 37-45 significant >80%; 46-51 moderate >50%; 52-55 lower >25%)  Pt ambulated to/from staircase (~28ft x 2) without AD and CGA and navigated 12 6in steps with bilateral handrails and close supervision ascending and descending with a step through pattern to simulate home entry - cues to widen BOS with gait. Pt then performed standing alternating toe taps to 6in step 2x20 without UE support with CGA/min A for balance - emphasis on hip flexor strengthening.  Transitioned to alternating heel taps to 6in step 2x20 with min A for balance - emphasis on DF ROM.   Pt then ambulated additional 266ft without AD and CGA - pt ambulates with narrow BOS, decreased L foot clearance, and decreased cadence. Provided cues to widen BOS and increase L step length. Worked on lateral stepping 78ft each direction with 1UE support fading to no UE support with emphasis on hip abductor strengthening - mod cues provided for technique. Pt performed standing mini squats 3x10 without UE support and emphasis on quad strength. Ambulated 164ft without AD and CGA back to room with same cues mentioned above. Returned to bed and NT arrived to check vitals. Concluded session with pt semi-reclined in bed, needs within reach, and bed alarm on. Provided pt with icee (cleared by RN).    Therapy Documentation Precautions:  Precautions Precautions: Fall Precaution/Restrictions Comments: L hemiparesis Restrictions Weight Bearing Restrictions Per Provider Order: No  Therapy/Group: Individual Therapy Therisa HERO Zaunegger Therisa Stains PT, DPT 07/08/2024, 6:59 AM

## 2024-07-08 NOTE — Progress Notes (Signed)
 Occupational Therapy Session Note  Patient Details  Name: Monica Austin MRN: [id number] Date of Birth: 12-28-1955  Today's Date: 07/08/2024 OT Individual Time: 0915-1000 OT Individual Time Calculation (min): 45 min    Short Term Goals: Week 1:  OT Short Term Goal 1 (Week 1): patient will demonstrate increased transfers completing with CGA following taught strategies to improve balance OT Short Term Goal 1 - Progress (Week 1): Met OT Short Term Goal 2 (Week 1): patient will complete bilateral UE activity with 1-2 verbal cues for placement. OT Short Term Goal 2 - Progress (Week 1): Met OT Short Term Goal 3 (Week 1): patient will complete LB dressing with CGA to increase dynamic sitting and standing balane OT Short Term Goal 3 - Progress (Week 1): Met OT Short Term Goal 4 (Week 1): patient will demonstrate increased safety awareness verbalizing and completing 2 safety strategies with 2 or fewer cues. OT Short Term Goal 4 - Progress (Week 1): Met Week 2:  OT Short Term Goal 1 (Week 2): STG=LTG due to LOS  Skilled Therapeutic Interventions/Progress Updates:    1:1 Pt received in the bed. Pt able to get to EOB and ambulate with RW with contact guard to the bathroom; pt required mod cues for safety with RW around the bed and maintaining all four legs of RW on the ground. Pt transferred to the toilet; pt's brief was soiled with urine- pt reported she just went in her brief. Pt reported she didn't call for assistance when had to go but no answer to why. Pt navigated into the shower with min A due to narrow BOS with feet with turning to get into the shower. Pt required min cues in the shower for functional problem solving to wash all parts. PT able to perform periarea hygiene in standing with contact guard. Pt dressed with supervision with extra time for problem solving. Pt did apply lotion to body with verbal cues for finishing rubbing in with left hand down right side of body. Pt did demonstrate  safety awareness deciding to sit to thread underwear and pants after thinking about completing it in standing. Pt completed grooming tasks at sink in sitting.   Practiced ambulating in the hallway carrying a simulated tray with cones on it with attention to maintaining upright posture, widening BOS and attention to maintaining balance while carrying an object (without AD). Pt able complete with contact guard. Discussed with pt and pt's son about supervision at home when up on her feet for safety. Still a fall risk during functional mobility and for safety awareness.   Pt left sitting up in w/c in prep for next session.   Therapy Documentation Precautions:  Precautions Precautions: Fall Precaution/Restrictions Comments: L hemiparesis Restrictions Weight Bearing Restrictions Per Provider Order: No  Pain:   No reports of pain in session  Therapy/Group: Individual Therapy  Claudene Nest [facility] 07/08/2024, 11:54 AM

## 2024-07-08 NOTE — Discharge Summary (Signed)
 Physician Discharge Summary  Patient ID: Monica Austin MRN: [id number] DOB/AGE: [birth year] 68 y.o.  Admit date: 06/26/2024 Discharge date: 07/11/2024  Discharge Diagnoses:  Principal Problem:   Acute ischemic right MCA stroke (HCC) DVT prophylaxis E. coli UTI Hypotension Type 2 diabetes mellitus Hyperlipidemia Mild intermittent asthma Thyroid nodule/enlargement Fibroid uterus Medical noncompliance  Discharged Condition: Stable  Significant Diagnostic Studies: CT ABDOMEN PELVIS WO CONTRAST Result Date: 06/29/2024 CLINICAL DATA:  Epigastric pain. EXAM: CT ABDOMEN AND PELVIS WITHOUT CONTRAST TECHNIQUE: Multidetector CT imaging of the abdomen and pelvis was performed following the standard protocol without IV contrast. RADIATION DOSE REDUCTION: This exam was performed according to the departmental dose-optimization program which includes automated exposure control, adjustment of the mA and/or kV according to patient size and/or use of iterative reconstruction technique. COMPARISON:  None Available. FINDINGS: Lower chest: Lung bases are clear.  No pleural effusions. Hepatobiliary: No acute abnormality to the liver or gallbladder. No significant biliary dilatation. Pancreas: Unremarkable. No pancreatic ductal dilatation or surrounding inflammatory changes. Spleen: Normal in size without focal abnormality. Adrenals/Urinary Tract: Normal adrenal glands. Low-density structures in the renal sinus of both kidneys. No dilatation of the ureter or renal pelvis bilaterally. Findings are most compatible with renal sinus cysts. Negative for kidney stones or hydronephrosis. Normal urinary bladder. Stomach/Bowel: Normal appearance of stomach. No bowel dilatation. No evidence for a bowel obstruction. Retained barium within the rectum and within colonic diverticula. No evidence for focal bowel inflammation. There appears to be a small normal appendix. Vascular/Lymphatic: No significant vascular findings are  present. No enlarged abdominal or pelvic lymph nodes. Reproductive: Multiple exophytic nodular structures associated with the uterus. Findings are most compatible with multiple uterine fibroids. Limited evaluation of the adnexa structures on this examination without intravascular contrast. Other: Probable small left inguinal hernia containing fat. Negative for free fluid. Negative for free air. Small umbilical hernia containing fat. Musculoskeletal: Joint space narrowing and degenerative changes in both hips. Grade 1 anterolisthesis of L4 on L5 that appears to be secondary to facet arthropathy. IMPRESSION: 1. No acute abnormality in the abdomen or pelvis. 2. Colonic diverticulosis without evidence for diverticulitis. 3. Probable fibroid uterus. Limited evaluation of the uterus and adnexa on this exam. Electronically Signed   By: Juliene Balder M.D.   On: 06/29/2024 13:39   DG Abd 1 View Result Date: 06/28/2024 CLINICAL DATA:  Emesis EXAM: ABDOMEN - 1 VIEW COMPARISON:  None Available. FINDINGS: Nonobstructed gas pattern with contrast in the colon and rectum. Diverticular disease of the colon. No radiopaque calculi IMPRESSION: Nonobstructed gas pattern. Diverticular disease of the colon. Electronically Signed   By: Luke Bun M.D.   On: 06/28/2024 14:49   DG Swallowing Func-Speech Pathology Result Date: 06/24/2024 Table formatting from the original result was not included. Modified Barium Swallow Study Patient Details Name: Monica Austin MRN: [id number] Date of Birth: [birth year] Today's Date: 06/24/2024 HPI/PMH: HPI: 68 yo recently admitted to hospital in [facility] while on vacation and dx with R basal ganglia stroke. SLP clinical swallow eval completed there on 06/22/24 reported impaired mastication, and IDDSI diet level 7 (easy to chew) was recommended. No overt pharyngeal symptoms observed but subjectively pt reported that her saliva felt like it went down the wrong way intermittently so MBS was recommended. Report not  available so not sure if this was completed prior to pt leaving AMA 6/29, driving to [place] with family and reporting difficulty swallowing along the way. She presented directly to [facility] at the recommendation of MD in  [place]. PMH significant for HTN, HLD, DMII. Clinical Impression: Clinical Impression: Pt presents with an oral more than pharyngeal dysphagia. She has reduced labial seal, slow mastication, and delayed lingual transport. Oral deficits lead to premature loss of the bolus into the pharynx, with thin and nectar thick liquids also entering the laryngeal vestibule before the swallow. She has pretty good protective mechanisms of her airway, so that even when larger amounts of penetration occur (like with thin liquids via straw), she can almost always clear her laryngeal vestibule. If it does not occur immediately, it does on subsequent swallow. A chin tuck helped to reduce the volume of penetration compared to straw sips in a neutral position, but did not prevent penetration. Recommend that she continue with Dys 3 (mechanical soft) solids and thin liquids. If coughing persists at meal times, could consider further modifications (remove straws, try chin tuck, or thicken to nectar). Would still offer pills with puree as pt had difficulty clearing the barium tablet from her oral cavity. When she ultimately did, it was transiently retained in the esophagus before clearing with a liquid wash. Factors that may increase risk of adverse event in presence of aspiration Noe & Lianne 2021): Factors that may increase risk of adverse event in presence of aspiration Noe & Lianne 2021): Reduced cognitive function; Limited mobility Recommendations/Plan: Swallowing Evaluation Recommendations Swallowing Evaluation Recommendations Recommendations: PO diet PO Diet Recommendation: Dysphagia 3 (Mechanical soft); Thin liquids (Level 0) Liquid Administration via: Cup; Straw Medication Administration: Whole meds with puree  Supervision: Patient able to self-feed; Intermittent supervision/cueing for swallowing strategies; Set-up assistance for safety Swallowing strategies  : Slow rate; Small bites/sips; Check for anterior loss Postural changes: Position pt fully upright for meals Oral care recommendations: Oral care BID (2x/day) Treatment Plan Treatment Plan Treatment recommendations: Therapy as outlined in treatment plan below Follow-up recommendations: Acute inpatient rehab (3 hours/day) Functional status assessment: Patient has had a recent decline in their functional status and demonstrates the ability to make significant improvements in function in a reasonable and predictable amount of time. Treatment frequency: Min 2x/week Treatment duration: 2 weeks Interventions: Aspiration precaution training; Compensatory techniques; Patient/family education; Trials of upgraded texture/liquids; Diet toleration management by SLP Recommendations Recommendations for follow up therapy are one component of a multi-disciplinary discharge planning process, led by the attending physician.  Recommendations may be updated based on patient status, additional functional criteria and insurance authorization. Assessment: Orofacial Exam: Orofacial Exam Oral Cavity - Dentition: Adequate natural dentition Anatomy: Anatomy: WFL Boluses Administered: Boluses Administered Boluses Administered: Thin liquids (Level 0); Mildly thick liquids (Level 2, nectar thick); Moderately thick liquids (Level 3, honey thick); Puree; Solid  Oral Impairment Domain: Oral Impairment Domain Lip Closure: Interlabial escape, no progression to anterior lip Tongue control during bolus hold: Cohesive bolus between tongue to palatal seal Bolus preparation/mastication: Slow prolonged chewing/mashing with complete recollection Bolus transport/lingual motion: Delayed initiation of tongue motion (oral holding) Oral residue: Complete oral clearance Location of oral residue : N/A Initiation of  pharyngeal swallow : Pyriform sinuses  Pharyngeal Impairment Domain: Pharyngeal Impairment Domain Soft palate elevation: No bolus between soft palate (SP)/pharyngeal wall (PW) Laryngeal elevation: Complete superior movement of thyroid cartilage with complete approximation of arytenoids to epiglottic petiole Anterior hyoid excursion: Complete anterior movement Epiglottic movement: Complete inversion Laryngeal vestibule closure: Complete, no air/contrast in laryngeal vestibule Pharyngeal stripping wave : Present - complete Pharyngeal contraction (A/P view only): N/A Pharyngoesophageal segment opening: Complete distension and complete duration, no obstruction of flow Tongue base retraction: No  contrast between tongue base and posterior pharyngeal wall (PPW) Pharyngeal residue: Complete pharyngeal clearance Location of pharyngeal residue: N/A  Esophageal Impairment Domain: Esophageal Impairment Domain Esophageal clearance upright position: Complete clearance, esophageal coating Pill: Pill Consistency administered: Thin liquids (Level 0) Thin liquids (Level 0): Impaired (see clinical impressions) Penetration/Aspiration Scale Score: Penetration/Aspiration Scale Score 1.  Material does not enter airway: Moderately thick liquids (Level 3, honey thick); Puree; Solid; Pill 2.  Material enters airway, remains ABOVE vocal cords then ejected out: Thin liquids (Level 0); Mildly thick liquids (Level 2, nectar thick) Compensatory Strategies: Compensatory Strategies Compensatory strategies: Yes Straw: Effective Effective Straw: Thin liquid (Level 0) Chin tuck: Effective Effective Chin Tuck: Thin liquid (Level 0)   General Information: Caregiver present: No  Diet Prior to this Study: Dysphagia 3 (mechanical soft); Thin liquids (Level 0)   Temperature : Normal   Respiratory Status: WFL   Supplemental O2: None (Room air)   History of Recent Intubation: No  Behavior/Cognition: Alert; Cooperative; Pleasant mood; Requires cueing  Self-Feeding Abilities: Able to self-feed Baseline vocal quality/speech: Normal No data recorded Volitional Swallow: Able to elicit Exam Limitations: No limitations Goal Planning: Prognosis for improved oropharyngeal function: Good Barriers to Reach Goals: Cognitive deficits No data recorded Patient/Family Stated Goal: none stated Consulted and agree with results and recommendations: Patient Pain: Pain Assessment Pain Assessment: Faces Faces Pain Scale: 0 End of Session: Start Time:SLP Start Time (ACUTE ONLY): 1245 Stop Time: SLP Stop Time (ACUTE ONLY): 1301 Time Calculation:SLP Time Calculation (min) (ACUTE ONLY): 16 min Charges: SLP Evaluations $ SLP Speech Visit: 1 Visit SLP Evaluations $BSS Swallow: 1 Procedure $MBS Swallow: 1 Procedure $ SLP EVAL LANGUAGE/SOUND PRODUCTION: 1 Procedure SLP visit diagnosis: SLP Visit Diagnosis: Dysphagia, oral phase (R13.11) Past Medical History: Past Medical History: Diagnosis Date  Fatigue   Hyperlipidemia   Hypertension   NIDDM (non-insulin  dependent diabetes mellitus)   Borderline Past Surgical History: Past Surgical History: Procedure Laterality Date  BREAST BIOPSY    BTL    COLONOSCOPY  02/17/2020  HERNIA REPAIR    OVARIAN CYST REMOVAL   Leita SAILOR., M.A. CCC-SLP Acute Rehabilitation Services Office: [phone] Secure chat preferred 06/24/2024, 1:43 PM   Labs:  Basic Metabolic Panel: Recent Labs  Lab 07/03/24 0559 07/07/24 0514  NA 140 138  K 3.8 4.0  CL 107 109  CO2 26 23  GLUCOSE 183* 278*  BUN 19 16  CREATININE 0.87 0.87  CALCIUM  9.4 8.9    CBC: Recent Labs  Lab 07/03/24 0559 07/07/24 0514  WBC 5.8 5.9  HGB 11.9* 11.4*  HCT 35.3* 33.3*  MCV 82.9 82.6  PLT 238 234    CBG: Recent Labs  Lab 07/06/24 2019 07/07/24 0529 07/07/24 1203 07/07/24 1636 07/07/24 2100  GLUCAP 218* 239* 142* 204* 150*   Family history.  Mother with COPD Sister with colon polyps.  Denies any colon cancer esophageal cancer or stomach cancer  Brief HPI:   Monica Austin is a 68 y.o. right-handed female with history significant for hypertension, diabetes mellitus, hyperlipidemia, medical noncompliance who was admitted to the hospital while vacationing in [place]  on 06/21/2024 with elevated blood pressure 216/66, left-sided weakness slurred speech and dysphagia.  LSN and the night before she went to bed and did not receive thrombolysis.  She was found to have right basal ganglia infarction with moderate to severe narrowing B/PCA, moderate to severe narrowing right-VA, severe focal narrowing mid portion of L-VA, severe focal narrowing M2 left MCA, distal M1 right MCA  as well as moderate incidental findings of diffuse enlargement of thyroid gland with multiple small hypodense nodules and multilevel cervical spondylosis.  DAPT recommended.  She would left AMA and was noted to have issues with aspiration and route to [place]  presented to [facility] emergency department for evaluation and transferred to [facility] for management.  Neurology recommended 90-day duration of DAPT followed by aspirin  alone.  She was started on Rocephin  found to have E. coli UTI transition to Keflex  to complete 7-day course treatment.  Blood sugars monitored hemoglobin A1c 9.7 insulin  therapy recommended. Speech therapy evaluation showed oropharyngeal dysphagia with impaired mastication and penetration per MBS.  Current recommendations were D3 thin liquids recommended and if coughing persist try chin tuck removing straws.  Therapy evaluations revealed mild to moderate dysarthria with imprecise articulation, mono tonus speech with extra time needed for processing somewhat impulsive.  Therapy evaluations completed and patient was admitted for a comprehensive rehab program.   Hospital Course: Monica Austin was admitted to rehab 06/26/2024 for inpatient therapies to consist of PT, ST and OT at least three hours five days a week. Past admission physiatrist, therapy team and rehab RN have worked  together to provide customized collaborative inpatient rehab.  Pertaining to patient right basal ganglia infarct/right MCA stroke remained stable follow-up neurology services she will continue DAPT x 3 months followed by aspirin  alone.  Lovenox  for DVT prophylaxis.  Hospital course E. coli UTI completing course of Keflex .  There was some findings of fibrous uterus referred to outpatient OB/GYN.  Fluctuating hypotension monitor closely Toprol  adjusted to avoid hypotension.  Blood sugars with hemoglobin A1c 9.7 was on metformin  prior to admission with poor control Hospital course insulin  therapy as well as the addition of low-dose Glucotrol .  Initially on metformin  discontinued due to nausea. Diabetic teaching will need outpatient follow-up.  Crestor  ongoing for hyperlipidemia.  Mild intermittent asthma continued on Singulair  oxygen saturations maintained greater than 92%.  Thyroid nodules noted during workup of CVA ultrasound for workup as outpatient.   Blood pressures were monitored on TID basis and soft and monitored  Diabetes has been monitored with ac/hs CBG checks and SSI was use prn for tighter BS control.    Rehab course: During patient's stay in rehab weekly team conferences were held to monitor patient's progress, set goals and discuss barriers to discharge. At admission, patient required minimal assist 60 feet rollator.  Minimal assist sit to stand  He/She  has had improvement in activity tolerance, balance, postural control as well as ability to compensate for deficits. He/She has had improvement in functional use RUE/LUE  and RLE/LLE as well as improvement in awareness       Disposition:  There are no questions and answers to display.         Diet: Mechanical soft diabetic diet  Special Instructions: No driving smoking or alcohol  Continue Plavix  75 mg daily and aspirin  81 mg daily x 90 days total then aspirin  alone  Follow-up outpatient OB/GYN for fibroid  uterus  Follow-up outpatient ultrasound for findings of thyroid nodule during workup of CVA  Medications at discharge 1.  Tylenol  as needed 2.  Aspirin  81 mg p.o. daily 3.  Vitamin D  2000 units p.o. daily 4.  Plavix  75 mg p.o. daily 5.  Glucotrol  XL 10 mg p.o. daily 6.  Lantus  insulin  13 units daily 7.  Magnesium  gluconate 250 mg p.o. daily 8.  Melatonin 5 mg p.o. nightly as needed sleep 9.  Toprol -XL 12.5 mg  p.o. daily 10.  Singulair  10 mg p.o. nightly 11.  Protonix  40 mg p.o. daily 12.  MiraLAX  daily hold for loose stools 13.  Crestor  20 mg every evening  30-35 minutes were spent completing discharge summary and discharge planning  Discharge Instructions     Ambulatory referral to Physical Medicine Rehab   Complete by: As directed    Moderate complexity follow-up 1 to 2 weeks right MCA infarction      Allergies as of 07/08/2024       Reactions   Zestril [lisinopril] Swelling   Tongue swelling   Zocor [simvastatin] Swelling     Med Rec must be completed prior to using this [facility]***        Signed: Toribio PARAS Paisly [place] 07/08/2024, 5:09 AM

## 2024-07-08 NOTE — Progress Notes (Signed)
 Occupational Therapy Session Note  Patient Details  Name: Monica Austin MRN: [id number] Date of Birth: [birth year]  {CHL IP REHAB OT TIME CALCULATIONS:[id number]}   Short Term Goals: Week 2:  OT Short Term Goal 1 (Week 2): STG=LTG due to LOS  Skilled Therapeutic Interventions/Progress Updates:  Pt greeted *** for skilled OT session with focus on ***.   Pain: Pt reported ***/10 pain, stating *** in reference to ***. OT offering intermediate rest breaks and positioning suggestions throughout session to address pain/fatigue and maximize participation/safety in session.   Functional Transfers:  Self Care Tasks: Pt completes the following self care tasks with levels of assistance noted below, UB: LB:   Therapeutic Activities:  Therapeutic Exercise:   Education:  Pt remained *** with 4Ps assessed and immediate needs met. Pt continues to be appropriate for skilled OT intervention to promote further functional independence in ADLs/IADLs.    Therapy Documentation Precautions:  Precautions Precautions: Fall Precaution/Restrictions Comments: L hemiparesis Restrictions Weight Bearing Restrictions Per Provider Order: No   Therapy/Group: Individual Therapy  Nereida Habermann, OTR/L, MSOT  07/08/2024, 10:42 PM

## 2024-07-08 NOTE — Progress Notes (Signed)
 PROGRESS NOTE   Subjective/Complaints: No new complaints this morning Excited to go home Discussed CBGs, provided dietary education, increased insulin   Patient's chart reviewed- No issues reported overnight Vitals signs stable   ROS:    Pt denies SOB, abd pain, CP, N/V/C/D, and vision changes   Objective:   No results found.  Recent Labs    07/07/24 0514 07/08/24 0520  WBC 5.9 6.8  HGB 11.4* 11.5*  HCT 33.3* 32.9*  PLT 234 228    Recent Labs    07/07/24 0514  NA 138  K 4.0  CL 109  CO2 23  GLUCOSE 278*  BUN 16  CREATININE 0.87  CALCIUM  8.9     Intake/Output Summary (Last 24 hours) at 07/08/2024 1108 Last data filed at 07/07/2024 1811 Gross per 24 hour  Intake 456 ml  Output --  Net 456 ml        Physical Exam: Vital Signs Blood pressure 134/64, pulse (!) 52, temperature 97.8 F (36.6 C), resp. rate 17, height 5' 7 (1.702 m), weight 82.2 kg, SpO2 100%.     General: asleep, woke to stimuli; supine in bed; NAD HENT: conjugate gaze; oropharynx moist CV: bradcyardia Pulmonary: CTA B/L; no W/R/R- good air movement GI: soft, NT, ND, (+)BS Psychiatric: appropriate- but sleepy Neurological: sleepy- just woke her up  Ext: no clubbing, cyanosis, or edema  Skin: intact Musculoskeletal:     Cervical back: Neck supple. No tenderness.      Neuro: RUE 5/5 thorughout LUE- ~4/5 in all muscles except FA which is 2+ to 3-/5 RLE- 5/5 throughout LLE- 4+ throughout, stable 7/15    Comments: Left facial weakness with mild dysarthria. Able to answer orientation questions without difficulty and follow simple one and two step commands. Fair insight and awareness. Oriented to hospital, month, aware of situation  Intact to light touch in all 4 extremities Functional mobility: standing and ambulating S      Assessment/Plan: 1. Functional deficits which require 3+ hours per day of interdisciplinary therapy  in a comprehensive inpatient rehab setting. Physiatrist is providing close team supervision and 24 hour management of active medical problems listed below. Physiatrist and rehab team continue to assess barriers to discharge/monitor patient progress toward functional and medical goals  Care Tool:  Bathing    Body parts bathed by patient: Left upper leg, Right arm, Left arm, Right lower leg, Chest, Abdomen, Left lower leg, Face, Front perineal area, Right upper leg, Buttocks   Body parts bathed by helper: Buttocks     Bathing assist Assist Level: Supervision/Verbal cueing     Upper Body Dressing/Undressing Upper body dressing   What is the patient wearing?: Pull over shirt    Upper body assist Assist Level: Set up assist    Lower Body Dressing/Undressing Lower body dressing      What is the patient wearing?: Pants, Underwear/pull up     Lower body assist Assist for lower body dressing: Supervision/Verbal cueing     Toileting Toileting Toileting Activity did not occur (Clothing management and hygiene only): N/A (no void or bm)  Toileting assist Assist for toileting: Contact Guard/Touching assist     Transfers Chair/bed transfer  Transfers assist     Chair/bed transfer assist level: Contact Guard/Touching assist     Locomotion Ambulation   Ambulation assist      Assist level: Contact Guard/Touching assist Assistive device: Walker-rolling Max distance: 150'   Walk 10 feet activity   Assist     Assist level: Contact Guard/Touching assist Assistive device: Walker-rolling   Walk 50 feet activity   Assist    Assist level: Contact Guard/Touching assist Assistive device: Walker-rolling    Walk 150 feet activity   Assist Walk 150 feet activity did not occur:  (fatigue)  Assist level: Contact Guard/Touching assist Assistive device: Walker-rolling    Walk 10 feet on uneven surface  activity   Assist     Assist level: Contact  Guard/Touching assist Assistive device: Walker-rolling   Wheelchair     Assist Is the patient using a wheelchair?: No Type of Wheelchair: Manual    Wheelchair assist level: Dependent - Patient 0%      Wheelchair 50 feet with 2 turns activity    Assist        Assist Level: Dependent - Patient 0%   Wheelchair 150 feet activity     Assist      Assist Level: Dependent - Patient 0%   Blood pressure 134/64, pulse (!) 52, temperature 97.8 F (36.6 C), resp. rate 17, height 5' 7 (1.702 m), weight 82.2 kg, SpO2 100%.  Medical Problem List and Plan: 1. Functional deficits secondary to R BG stroke/R MCA stroke             -patient may  shower             -ELOS/Goals: 10-14 days- supervision to mod I- needs PT, OT and SLP             Grounds pass ordered    Might benefit from kick plate for right shoe vs AFO  Continue CIR PT and OT  Pt DOESN'T have PCP- needs one  Metanx ordered  2.  Antithrombotics: -DVT/anticoagulation:  Pharmaceutical: continue Lovenox              -antiplatelet therapy: DAPT X 3 months followed by ASA  3. Pain Management: tylenol  prn.  4. Mood/Behavior/Sleep: LCSW to follow for evaluation and support.              -antipsychotic agents: N/A 5. Neuropsych/cognition: This patient is capable of making decisions on her own behalf. 6. Skin/Wound Care: Routine pressure relief measures.  7. Fluids/Electrolytes/Nutrition: improved po  -I personally reviewed the patient's labs today.   8. E coli UTI: Keflex  antibiotic  Done with Keflex  for UTI  9. Hypotension fluctuating with hypertension: d/c Toprol  given bradycardia. Avoid hypotension with evidence of intracranial stenosis. Add magnesium  supplement            7/11 bp controlled  7/12-7/13-  BP controlled except 1 BP of 140/71- con't regime n 10.  T2DM: Hgb A1c- 9.7. Monitor BS ac/hs and use SSI for elevated BS. Add CM restrictions to diet.             --Was on metformin  PTA with poor control.   --now on Insulin  glargline. decrease SSI to sensitive.               --RD consulted for education on carb modified diet and Rehab RN for insulin  education.   -provide list of foods for diabetes  -CBGs reviewed  7/11-stopped metformin  d/t nausea--cbg's increasing a bit now that she's eating more as well.    -  began low dose glucotrol ---increased to 5mg  xl 7/10   -continue same semglee    -will need further titration of glucotrol --increase to 10mg  xl effective 7/12  7/12- pt doesn't want to go home on insulin , but explained will probably need some until her BG's become better controlled- will need to f/u with PCP- Cbg's still in low 200's- glucotrol  started this AM- cannot titrate dose yet  Increase insulin  to 14U CBG (last 3)  Recent Labs    07/07/24 1636 07/07/24 2100 07/08/24 0603  GLUCAP 204* 150* 158*    11. Dyslipidemia: continue Crestor .   12.  Mild intermittent asthma: continue Singulair   13. Thyroid nodules/enlargement: Ultrasound for work up after discharge.   14. Nausea:d/t metformin ---stopped  - resolved   -push po/fluids.  15. Fibroid uterus: refer for outpatient OB/GYN f/u    LOS: 12 days A FACE TO FACE EVALUATION WAS PERFORMED  Monica Austin P Monica Austin 07/08/2024, 11:08 AM

## 2024-07-08 NOTE — Progress Notes (Signed)
 Physical Therapy Session Note  Patient Details  Name: Monica Austin MRN: [id number] Date of Birth: [birth year]  Today's Date: 07/08/2024 PT Individual Time: 1105-1204 PT Individual Time Calculation (min): 59 min   Short Term Goals: Week 2:  PT Short Term Goal 1 (Week 2): STG = LTG due to ELOS  Skilled Therapeutic Interventions/Progress Updates:    Patient in bed on entrance to room. Patient alert and agreeable to PT session.   Patient reported that she is feeling good and that she is progressing well. Pt reported that she has been walking without an AD in therapy and that she is encouraged by her progress.   Neuromuscular Re-ed: NMR facilitated during session with focus L sided awareness during gait, reducing fall risk during gait, dynamic balance, multi-directional movement.  - Hurdle Step Obstacle Course- hand held support - Focusing on taking large steps, wider steps, and increase stance time on each leg. Pt also was required to perform tight curvilinear walking in both directions.  -Pt also performed gait with extensive cueing on walking to maintain step width using a line embedded on the floor, keeping each foot from crossing over the midline on the ground to reduce scissoring. - Pt also needed cueing to heel strike on the L side and take longer steps, pt was able to walk back to her room with RW without dragging her L toe. Hand held support-  -Mirror stepping drill - hand support stepping in the same direction as therapist, focusing on taking large steps and pushing back to center.   NMR performed for improvements in motor control and coordination, balance, sequencing, judgement, and self confidence/ efficacy in performing all aspects of mobility at highest level of independence.    Patient in bed at end of session with brakes locked, chair alarm set, and all needs within reach.   Therapy Documentation Precautions:  Precautions Precautions: Fall Precaution/Restrictions  Comments: L hemiparesis Restrictions Weight Bearing Restrictions Per Provider Order: No    Therapy/Group: Individual Therapy  Chris Narasimhan 07/08/2024, 12:11 PM

## 2024-07-08 NOTE — Consult Note (Signed)
 Neuropsychological Consultation Comprehensive Inpatient Rehab   Patient:   Monica Austin   DOB:   1956-04-04  MR Number:  [id number]  Location:  [facility] [address] [place] [address] Dept: [phone] Loc: [phone]           Date of Service:   07/08/2024  Start Time:   3 PM End Time:   4 PM  Provider/Observer:  Norleen Asa, Psy.D.       Clinical Neuropsychologist       Billing Code/Service: [phone]  Reason for Service:    Monica Austin is a 68 year old right-handed female referred for neuropsychological consultation due to coping issues following recent cerebrovascular accident. Currently admitted to comprehensive inpatient rehabilitation unit due to functional decline. Previously independent prior to admission.  Medical History: Hypertension, type 2 diabetes with elevated A1C, hyperlipidemia, medication noncompliance. No prior psychiatric illness. No current psychotropic medications.  Stroke History: Admitted to hospital while vacationing in [place]  on 06/21/2024 with elevated blood pressure (approximately 260-270/160). Presented with left-sided weakness, slurred speech, and dysphasia. Last known normal status was the night before. Not candidate for thrombolytics by time of presentation to ED. Imaging revealed right basal ganglia infarct with moderate to severe narrowing of PCA artery and focal narrowing of various cerebral arteries. Left AMA from [place]  hospital. Developed aspiration issues en route to [place] , resulting in pneumonia/infection treated with antibiotics. Presented to [facility] ED and transferred to [facility] for management before admission to CIR.  Current Understanding: Reports limited understanding of stroke event. Aware had some kind of stroke affecting left side of body. Understands rehabilitation unit purpose is to improve left-sided  function following right-sided brain stroke.  Medication History: History of medication noncompliance, particularly 5-6 days prior to stroke event. Previously taking Eliquis for anticoagulation. Reports being messy with medication adherence despite good results with A1C and blood pressure control through vitamins and dietary changes. Expresses commitment to improved medication compliance following stroke.  Social History: Lives independently. Was vacationing in [place], [place]  with family when stroke occurred. Grandson noticed speech slurring the evening before hospitalization. Son facilitated transfer from [place]  to [place]  for continued care. Cousin provides daily support during hospitalization.  Current Status: Demonstrating week-to-week improvement in recovery. Participating in PT for coordination, balance, and strength; OT for activities of daily living; and speech therapy for attention, concentration, and memory. Mood reported as good with family support. Understanding stroke as likely embolic in nature related to medication noncompliance and elevated blood pressure.  Education Provided: Discussed stroke pathophysiology, embolic nature of event, importance of medication compliance, blood pressure management, and recovery expectations. Explained inflammatory process and potential for continued improvement over several months. Emphasized that symptoms cannot be considered permanent until 18 months post-stroke, with most recovery occurring in first few months.    Medical History:   Past Medical History:  Diagnosis Date   Diverticulosis    Fatigue    Hyperlipidemia    Hypertension    NIDDM (non-insulin  dependent diabetes mellitus)    Borderline         Patient Active Problem List   Diagnosis Date Noted   Cognitive deficits following cerebrovascular disease 07/08/2024   Acute ischemic right MCA stroke (HCC) 06/26/2024   Acute stroke due to ischemia [facility]) 06/24/2024    Dysphagia 06/23/2024   CVA (cerebral vascular accident) (HCC) 06/23/2024   UTI (urinary tract infection) 06/23/2024   Uncontrolled type 2 diabetes  mellitus with hyperglycemia (HCC) 06/23/2024   Essential hypertension 06/23/2024   Hyperlipidemia 06/23/2024   Fatigue     Behavioral Observation/Mental Status:   Monica Austin  presents as a 68 y.o.-year-old Right handed African American Female who appeared her stated age. her dress was Appropriate and she was Well Groomed and her manners were Appropriate to the situation.  her participation was indicative of Inattentive and Redirectable behaviors.  There were physical disabilities noted.  she displayed an appropriate level of cooperation and motivation.    Interactions:    Active Inattentive  Attention:   abnormal and attention span appeared shorter than expected for age  Memory:   abnormal; remote memory intact, recent memory impaired  Visuo-spatial:   not examined  Speech (Volume):  low  Speech:   slurred; normal  Thought Process:  Coherent and Relevant  Coherent and Linear  Though Content:  WNL; not suicidal and not homicidal  Orientation:   person, place, and situation  Judgment:   Fair  Planning:   Poor  Affect:    Flat and Lethargic  Mood:    Dysphoric  Insight:   Fair  Intelligence:   normal  Psychiatric History:  No prior psychiatric history noted and no current psychotropic medications noted  Family Med/Psych History:  Family History  Problem Relation Age of Onset   COPD Mother    Cancer Father    Colon polyps Sister    Colon cancer Neg Hx    Esophageal cancer Neg Hx    Pancreatic cancer Neg Hx    Stomach cancer Neg Hx     Impression/DX:   Monica Austin is a 68 year old right-handed female referred for neuropsychological consultation due to coping issues following recent cerebrovascular accident. Currently admitted to comprehensive inpatient rehabilitation unit due to functional decline. Previously  independent prior to admission.  Disposition/Plan:  Continue comprehensive rehabilitation program. Medication management optimization during inpatient stay. Discharge planning includes primary care physician establishment and medication refill coordination. Ongoing neuropsychological monitoring and support throughout recovery process.  Diagnosis:    Residual cognitive deficits and motor deficits post cerebrovascular event         Electronically Signed   _______________________ Norleen Asa, Psy.D. Clinical Neuropsychologist

## 2024-07-08 NOTE — Plan of Care (Signed)
  Problem: Consults Goal: RH STROKE PATIENT EDUCATION Description: See Patient Education module for education specifics  Outcome: Progressing   Problem: RH BLADDER ELIMINATION Goal: RH STG MANAGE BLADDER WITH ASSISTANCE Description: STG Manage Bladder With toileting Assistance Outcome: Progressing   Problem: RH KNOWLEDGE DEFICIT Goal: RH STG INCREASE KNOWLEDGE OF DYSPHAGIA/FLUID INTAKE Description: Patient and dtr will be able to manage dysphagia using educational resources for medications, and dietary modification independently Outcome: Progressing   Problem: Skin Integrity: Goal: Risk for impaired skin integrity will decrease Outcome: Progressing

## 2024-07-09 LAB — GLUCOSE, CAPILLARY
Glucose-Capillary: 198 mg/dL — ABNORMAL HIGH (ref 70–99)
Glucose-Capillary: 166 mg/dL — ABNORMAL HIGH (ref 70–99)
Glucose-Capillary: 215 mg/dL — ABNORMAL HIGH (ref 70–99)
Glucose-Capillary: 254 mg/dL — ABNORMAL HIGH (ref 70–99)

## 2024-07-09 MED ORDER — ACETAMINOPHEN 325 MG PO TABS: 325.0000 mg | ORAL_TABLET | ORAL | Status: AC | PRN

## 2024-07-09 MED ORDER — INSULIN GLARGINE-YFGN 100 UNIT/ML ~~LOC~~ SOLN
15.0000 [IU] | Freq: Every day | SUBCUTANEOUS | Status: DC
  Administered 2024-07-09 – 2024-07-10 (×2): 15 [IU] via SUBCUTANEOUS
  Filled 2024-07-09 (×2): qty 0.15

## 2024-07-09 MED ORDER — POLYETHYLENE GLYCOL 3350 17 G PO PACK: 17.0000 g | PACK | Freq: Every day | ORAL | Status: AC

## 2024-07-09 NOTE — Progress Notes (Signed)
 Occupational Therapy Session Note  Patient Details  Name: Monica Austin MRN: [id number] Date of Birth: [birth year]  Today's Date: 07/09/2024 OT Individual Time: 1000-1100 OT Individual Time Calculation (min): 60 min    Short Term Goals: Week 1:  OT Short Term Goal 1 (Week 1): patient will demonstrate increased transfers completing with CGA following taught strategies to improve balance OT Short Term Goal 1 - Progress (Week 1): Met OT Short Term Goal 2 (Week 1): patient will complete bilateral UE activity with 1-2 verbal cues for placement. OT Short Term Goal 2 - Progress (Week 1): Met OT Short Term Goal 3 (Week 1): patient will complete LB dressing with CGA to increase dynamic sitting and standing balane OT Short Term Goal 3 - Progress (Week 1): Met OT Short Term Goal 4 (Week 1): patient will demonstrate increased safety awareness verbalizing and completing 2 safety strategies with 2 or fewer cues. OT Short Term Goal 4 - Progress (Week 1): Met Week 2:  OT Short Term Goal 1 (Week 2): STG=LTG due to LOS  Skilled Therapeutic Interventions/Progress Updates:    1:1 Continued practiced with functional mobility with and without RW. Pt ambulated from her room to the dayroom with contact guard. Ot did raise her RW one level to encourage more upright posture while appeared to help correct posture during ambulation. Pt performed working memory and balance activity of reading a question and then walking across the room to a map tapped on the wall. Pt stepped up onto the airodex to look at the map to answer the question and then return to table with list of questions. Focus on balance, attention to environment and 180 degree turns. Pt performed with contact guard with min questioning cues for questions. Pt also performed oral to written directions with 50% accuracy of memory of items needed to write out.   Discussed with RT about an outing for tomorrow. Engaged in problem solving conversation about  outing and goals and functional mobility in community safely. Pt left in her room looking up coupons on her phone in bed.   Therapy Documentation Precautions:  Precautions Precautions: Fall Precaution/Restrictions Comments: L hemiparesis Restrictions Weight Bearing Restrictions Per Provider Order: No  Pain: Pain Assessment Pain Scale: 0-10 Pain Score: 0-No pain  Therapy/Group: Individual Therapy  [name] 07/09/2024, 1:43 PM

## 2024-07-09 NOTE — Progress Notes (Signed)
 PROGRESS NOTE   Subjective/Complaints: No new complains this morning Discussed elevated blood sugar yesterday, she said she ate [place] yesterday, she plans to avoid this  Patient's chart reviewed- No issues reported overnight Vitals signs stable   ROS:    Pt denies SOB, abd pain, CP, N/V/C/D, and vision changes   Objective:   No results found.  Recent Labs    07/07/24 0514 07/08/24 0520  WBC 5.9 6.8  HGB 11.4* 11.5*  HCT 33.3* 32.9*  PLT 234 228    Recent Labs    07/07/24 0514 07/08/24 2113  NA 138  --   K 4.0  --   CL 109  --   CO2 23  --   GLUCOSE 278* 319*  BUN 16  --   CREATININE 0.87  --   CALCIUM  8.9  --      Intake/Output Summary (Last 24 hours) at 07/09/2024 0956 Last data filed at 07/09/2024 0735 Gross per 24 hour  Intake 680 ml  Output --  Net 680 ml        Physical Exam: Vital Signs Blood pressure (!) 148/64, pulse 65, temperature 98.6 F (37 C), temperature source Oral, resp. rate 18, height 5' 7 (1.702 m), weight 82.2 kg, SpO2 97%.     General: asleep, woke to stimuli; supine in bed; NAD HENT: conjugate gaze; oropharynx moist CV: bradcyardia Pulmonary: CTA B/L; no W/R/R- good air movement GI: soft, NT, ND, (+)BS Psychiatric: appropriate- but sleepy Neurological: sleepy- just woke her up  Ext: no clubbing, cyanosis, or edema  Skin: intact Musculoskeletal:     Cervical back: Neck supple. No tenderness.      Neuro: RUE 5/5 thorughout LUE- ~4/5 in all muscles except FA which is 2+ to 3-/5 RLE- 5/5 throughout LLE- 4+ throughout, stable 7/16    Comments: Left facial weakness with mild dysarthria. Able to answer orientation questions without difficulty and follow simple one and two step commands. Fair insight and awareness. Oriented to hospital, month, aware of situation  Intact to light touch in all 4 extremities Functional mobility: standing and ambulating S       Assessment/Plan: 1. Functional deficits which require 3+ hours per day of interdisciplinary therapy in a comprehensive inpatient rehab setting. Physiatrist is providing close team supervision and 24 hour management of active medical problems listed below. Physiatrist and rehab team continue to assess barriers to discharge/monitor patient progress toward functional and medical goals  Care Tool:  Bathing    Body parts bathed by patient: Left upper leg, Right arm, Left arm, Right lower leg, Chest, Abdomen, Left lower leg, Face, Front perineal area, Right upper leg, Buttocks   Body parts bathed by helper: Buttocks     Bathing assist Assist Level: Supervision/Verbal cueing     Upper Body Dressing/Undressing Upper body dressing   What is the patient wearing?: Pull over shirt    Upper body assist Assist Level: Set up assist    Lower Body Dressing/Undressing Lower body dressing      What is the patient wearing?: Pants, Underwear/pull up     Lower body assist Assist for lower body dressing: Supervision/Verbal cueing     Toileting Toileting  Toileting Activity did not occur Press photographer and hygiene only): N/A (no void or bm)  Toileting assist Assist for toileting: Contact Guard/Touching assist     Transfers Chair/bed transfer  Transfers assist     Chair/bed transfer assist level: Contact Guard/Touching assist     Locomotion Ambulation   Ambulation assist      Assist level: Contact Guard/Touching assist Assistive device: No Device Max distance: 2106ft   Walk 10 feet activity   Assist     Assist level: Contact Guard/Touching assist Assistive device: No Device   Walk 50 feet activity   Assist    Assist level: Contact Guard/Touching assist Assistive device: No Device    Walk 150 feet activity   Assist Walk 150 feet activity did not occur:  (fatigue)  Assist level: Contact Guard/Touching assist Assistive device: No Device    Walk  10 feet on uneven surface  activity   Assist     Assist level: Contact Guard/Touching assist Assistive device: Walker-rolling   Wheelchair     Assist Is the patient using a wheelchair?: No Type of Wheelchair: Manual    Wheelchair assist level: Dependent - Patient 0%      Wheelchair 50 feet with 2 turns activity    Assist        Assist Level: Dependent - Patient 0%   Wheelchair 150 feet activity     Assist      Assist Level: Dependent - Patient 0%   Blood pressure (!) 148/64, pulse 65, temperature 98.6 F (37 C), temperature source Oral, resp. rate 18, height 5' 7 (1.702 m), weight 82.2 kg, SpO2 97%.  Medical Problem List and Plan: 1. Functional deficits secondary to R BG stroke/R MCA stroke             -patient may  shower             -ELOS/Goals: 10-14 days- supervision to mod I- needs PT, OT and SLP             Grounds pass ordered    Might benefit from kick plate for right shoe vs AFO  Continue CIR PT and OT  Pt DOESN'T have PCP- needs one  Metanx ordered, continue  2.  Antithrombotics: -DVT/anticoagulation:  Pharmaceutical: continue Lovenox              -antiplatelet therapy: DAPT X 3 months followed by ASA  3. Pain Management: tylenol  prn.  4. Mood/Behavior/Sleep: LCSW to follow for evaluation and support.              -antipsychotic agents: N/A 5. Neuropsych/cognition: This patient is capable of making decisions on her own behalf. 6. Skin/Wound Care: Routine pressure relief measures.  7. Fluids/Electrolytes/Nutrition: improved po  -I personally reviewed the patient's labs today.   8. E coli UTI: Keflex  antibiotic  Done with Keflex  for UTI  9. Hypotension fluctuating with hypertension: d/c Toprol  given bradycardia. Avoid hypotension with evidence of intracranial stenosis. continue magnesium  supplement           10.  T2DM: Hgb A1c- 9.7. Monitor BS ac/hs and use SSI for elevated BS. Add CM restrictions to diet.             --Was on  metformin  PTA with poor control.  --now on Insulin  glargline. decrease SSI to sensitive.               --RD consulted for education on carb modified diet and Rehab RN for insulin  education.   -provide  list of foods for diabetes  -CBGs reviewed  7/11-stopped metformin  d/t nausea--cbg's increasing a bit now that she's eating more as well.    -began low dose glucotrol ---increased to 5mg  xl 7/10   -continue same semglee    -will need further titration of glucotrol --increase to 10mg  xl effective 7/12  7/12- pt doesn't want to go home on insulin , but explained will probably need some until her BG's become better controlled- will need to f/u with PCP- Cbg's still in low 200's- glucotrol  started this AM- cannot titrate dose yet  Increase insulin  to 15U CBG (last 3)  Recent Labs    07/08/24 2054 07/08/24 2315 07/09/24 0605  GLUCAP 422* 291* 198*    11. Dyslipidemia: continue Crestor .   12.  Mild intermittent asthma: continue Singulair   13. Thyroid nodules/enlargement: Ultrasound for work up after discharge.   14. Nausea:d/t metformin ---stopped  - resolved   -push po/fluids.  15. Fibroid uterus: refer for outpatient OB/GYN f/u    LOS: 13 days A FACE TO FACE EVALUATION WAS PERFORMED  Sven P Alby Schwabe 07/09/2024, 9:56 AM

## 2024-07-09 NOTE — Progress Notes (Signed)
 Physical Therapy Session Note  Patient Details  Name: Monica Austin MRN: [id number] Date of Birth: 1956/06/05  Today's Date: 07/09/2024 PT Individual Time: 1503-1600 PT Individual Time Calculation (min): 57 min   Short Term Goals: Week 3:     Skilled Therapeutic Interventions/Progress Updates:  Patient *** on entrance to room. Patient alert and agreeable to PT session.   Patient with no pain complaint at start of session.  Therapeutic Activity: Bed Mobility: Pt performed supine <> sit with ***. VC/ tc required for ***. Transfers: Pt performed sit<>stand and stand pivot transfers throughout session with ***. Provided vc/ tc for***.  Gait Training:  Pt ambulated *** ft using *** with ***. Demonstrated ***. Provided vc/ tc for ***.  Wheelchair Mobility:  Pt propelled wheelchair *** feet with ***. Provided vc/ tc for ***.  Neuromuscular Re-ed: NMR facilitated during session with focus on ***. Pt guided in ***. NMR performed for improvements in motor control and coordination, balance, sequencing, judgement, and self confidence/ efficacy in performing all aspects of mobility at highest level of independence.   Therapeutic Exercise: Pt performed the following exercises with vc/ tc for proper technique. ***  Patient *** at end of session with brakes locked, *** alarm set, and all needs within reach.   Therapy Documentation Precautions:  Precautions Precautions: Fall Precaution/Restrictions Comments: L hemiparesis Restrictions Weight Bearing Restrictions Per Provider Order: No  Pain:    Therapy/Group: Individual Therapy  Mliss DELENA Milliner PT, DPT, CSRS 07/09/2024, 11:55 PM

## 2024-07-09 NOTE — Plan of Care (Signed)
  Problem: Consults Goal: RH STROKE PATIENT EDUCATION Description: See Patient Education module for education specifics  Outcome: Progressing Goal: Diabetes Guidelines if Diabetic/Glucose > 140 Description: If diabetic or lab glucose is > 140 mg/dl - Initiate Diabetes/Hyperglycemia Guidelines & Document Interventions  Outcome: Progressing   Problem: RH BLADDER ELIMINATION Goal: RH STG MANAGE BLADDER WITH ASSISTANCE Description: STG Manage Bladder With toileting Assistance Outcome: Progressing   Problem: RH KNOWLEDGE DEFICIT Goal: RH STG INCREASE KNOWLEDGE OF DIABETES Description: Patient and dtr will be able to manage DM using educational resources for medications, and dietary modification independently Outcome: Progressing   Problem: Skin Integrity: Goal: Risk for impaired skin integrity will decrease Outcome: Progressing

## 2024-07-09 NOTE — Patient Care Conference (Signed)
 Inpatient RehabilitationTeam Conference and Plan of Care Update Date: 07/09/2024   Time: 11:04 AM    Patient Name: Monica Austin      Medical Record Number: [id number]  Date of Birth: 03/05/1956 Sex: Female         Room/Bed: 4M05C/4M05C-01 Payor Info: Payor: BLUE CROSS BLUE [employer]IELD / Plan: BCBS COMM PPO / Product Type: *No Product type* /    Admit Date/Time:  06/26/2024 12:09 PM  Primary Diagnosis:  Acute ischemic right MCA stroke [facility])  Hospital Problems: Principal Problem:   Acute ischemic right MCA stroke [facility]) Active Problems:   Cognitive deficits following cerebrovascular disease    Expected Discharge Date: Expected Discharge Date: 07/11/24  Team Members Present: Physician leading conference: Dr. Sven Elks Social Worker Present: Rhoda Clement, LCSW Nurse Present: Barnie Ronde, RN PT Present: Therisa Stains, PT OT Present: Delon Sharps, OT SLP Present: Recardo Mole, SLP PPS Coordinator present : Eleanor Colon, SLP     Current Status/Progress Goal Weekly Team Focus  Bowel/Bladder   Continent B/B LBM 07/07/24   Will remain continent   Assist with toilet needs    Swallow/Nutrition/ Hydration   Dys3/thin liquids   supervision  family education of appropriate items and ongoing diet tolerance, diet upgrade if possible    ADL's   intermittent min cueing/ supervision seated BADL, occasional CGA for mobility   goals downgraded to supervision   caregiver training, balance, coordination - gross motor, LUE coord, reduce reaction time,  attention to left    Mobility   supervision/ Mod I for bed mobility  supervision for transfers  CGA for ambulation without RW (L swing clearence issues due to coordinaton/motor planning)   supervision with ambulation with RW  Barriers: 24/7 care, L environmental awareness, LLE awareness during gait (L foot clearance), dynamic balance /// Work on: continued dynamic balance and awareness, gait training, LOA with transfers/ gait, family  education    Communication   mild dysarthria   supervision   use of speech intelligibility strategies at the sentence level    Safety/Cognition/ Behavioral Observations  min deficits in alternating attention, thought organization, problem solving   supervision   attention, problem solving, executive functioning    Pain   Denies pain   Will be free from pain   Assess pain qshift/prn    Skin   Skin is intact   Will maintain skin intergrity  Assess skin qshift/prn for breakdown      Discharge Planning:  Between daughter, grandson, friend and another family member will provide 24/7 supervision. Daughter to have FMLA papers completed. Awaiting if daughter will be here Thursday or Friday for family education   Team Discussion: Patient post right MCA CVA with safety and balance issues with no awareness of left lower extremity. Progress limited by nausea; (medication adjusted per MD), dysarthria and mild cognition; attention and problem solving deficits.   Patient on target to meet rehab goals: Currently needs CGA for ambulation up to 150' with /without an assistive device and is mod I for transfers. Maintained on a dysphagia 3 thin liquid diet. Goals for discharge set for supervision overall.  *See Care Plan and progress notes for long and short-term goals.   Revisions to Treatment Plan:  Community outing Downgraded goals to supervision overall   Teaching Needs: Safety, medications, dietary modification, transfers, toileting, etc.   Current Barriers to Discharge: Decreased caregiver support and Home enviroment access/layout  Possible Resolutions to Barriers: Family education OP follow up services DME: RW, TTB  Medical Summary Current Status: hypertension, type 2 diabetes, CVA  Barriers to Discharge: Incontinence;Medical stability;Uncontrolled Diabetes  Barriers to Discharge Comments: hypertension, type 2 diabetes, CVA Possible Resolutions to [employer]:  continue to monitot BP TID, continue magnesium  supplement, increased insulin , continue to monitor CBGs, provided dietary education, continue metanx/plavix /lovenox /vitamin D    Continued Need for Acute Rehabilitation Level of Care: The patient requires daily medical management by a physician with specialized training in physical medicine and rehabilitation for the following reasons: Direction of a multidisciplinary physical rehabilitation program to maximize functional independence : Yes Medical management of patient stability for increased activity during participation in an intensive rehabilitation regime.: Yes Analysis of laboratory values and/or radiology reports with any subsequent need for medication adjustment and/or medical intervention. : Yes   I attest that I was present, lead the team conference, and concur with the assessment and plan of the team.   Fredericka Sober B 07/09/2024, 2:30 PM

## 2024-07-09 NOTE — Progress Notes (Signed)
 Physical Therapy Session Note  Patient Details  Name: Monica Austin MRN: [id number] Date of Birth: 31-May-1956  Today's Date: 07/09/2024 PT Individual Time: 1331-1410 PT Individual Time Calculation (min): 39 min   Short Term Goals: Week 1:  PT Short Term Goal 1 (Week 1): pt will ambulate 150 feet with LRAD and CGA PT Short Term Goal 1 - Progress (Week 1): Met PT Short Term Goal 2 (Week 1): pt will perform bed to chair transfer with LRAD and CGA PT Short Term Goal 2 - Progress (Week 1): Met PT Short Term Goal 3 (Week 1): pt will navigate 12 steps with LRAD and CGA PT Short Term Goal 3 - Progress (Week 1): Met Week 2:  PT Short Term Goal 1 (Week 2): STG = LTG due to ELOS  Skilled Therapeutic Interventions/Progress Updates:   Received pt sidelying in bed, pt agreeable to PT treatment, and denied any pain. Session with emphasis on functional mobility/transfers, toileting, generalized strengthening and endurance, dynamic standing balance/coordination, NMR, and ambulation. Pt performed bed mobility using bedrails and mod I x 2 trials throughout session and donned/doffed shoes with supervision. Pt performed all transfers with and without AD and close supervision throughout session. Pt ambulated in/out of bathroom with RW and CGA and able to manage clothing, void, and perform hygiene management without assist. Pt ambulated 147ft x 2 trials with RW and CGA/close supervision to/from main therapy gym. Worked on blocked practice sit<>stands on Airex 2x10 with 1UE support fading to no UE support with emphasis on quad strength. Ambulated to/from // bars without AD and CGA and worked on lateral step ups with LLE 2x10 with emphasis on hip abductor strengthening. Transitioned to LLE step ups onto 3in step with alternating march with BUE support fading to 1UE support 2x10 with emphasis on L hip flexor strengthening. Returned to room and concluded session with pt sidelying in bed, needs within reach, and bed alarm  on.    Therapy Documentation Precautions:  Precautions Precautions: Fall Precaution/Restrictions Comments: L hemiparesis Restrictions Weight Bearing Restrictions Per Provider Order: No  Therapy/Group: Individual Therapy Therisa HERO Zaunegger Therisa Stains PT, DPT 07/09/2024, 6:56 AM

## 2024-07-10 ENCOUNTER — Other Ambulatory Visit (HOSPITAL_COMMUNITY): Payer: Self-pay

## 2024-07-10 LAB — BASIC METABOLIC PANEL WITH GFR
Anion gap: 9 (ref 5–15)
BUN: 12 mg/dL (ref 8–23)
CO2: 24 mmol/L (ref 22–32)
Calcium: 9.1 mg/dL (ref 8.9–10.3)
Chloride: 105 mmol/L (ref 98–111)
Creatinine, Ser: 0.92 mg/dL (ref 0.44–1.00)
GFR, Estimated: >60 mL/min (ref >60)
Glucose, Bld: 143 mg/dL — ABNORMAL HIGH (ref 70–99)
Potassium: 3.7 mmol/L (ref 3.5–5.1)
Sodium: 138 mmol/L (ref 135–145)

## 2024-07-10 LAB — CBC
HCT: 32.7 % — ABNORMAL LOW (ref 36.0–46.0)
Hemoglobin: 11.1 g/dL — ABNORMAL LOW (ref 12.0–15.0)
MCH: 27.8 pg (ref 26.0–34.0)
MCHC: 33.9 g/dL (ref 30.0–36.0)
MCV: 82 fL (ref 80.0–100.0)
Platelets: 223 K/uL (ref 150–400)
RBC: 3.99 MIL/uL (ref 3.87–5.11)
RDW: 13 % (ref 11.5–15.5)
WBC: 6.9 K/uL (ref 4.0–10.5)
nRBC: 0 % (ref 0.0–0.2)

## 2024-07-10 LAB — GLUCOSE, CAPILLARY
Glucose-Capillary: 159 mg/dL — ABNORMAL HIGH (ref 70–99)
Glucose-Capillary: 165 mg/dL — ABNORMAL HIGH (ref 70–99)
Glucose-Capillary: 179 mg/dL — ABNORMAL HIGH (ref 70–99)
Glucose-Capillary: 168 mg/dL — ABNORMAL HIGH (ref 70–99)

## 2024-07-10 MED ORDER — MONTELUKAST SODIUM 10 MG PO TABS
10.0000 mg | ORAL_TABLET | Freq: Every day | ORAL | 0 refills | Status: DC
  Filled 2024-07-10: qty 30, 30d supply, fill #0

## 2024-07-10 MED ORDER — INSULIN GLARGINE 100 UNIT/ML SOLOSTAR PEN
15.0000 [IU] | PEN_INJECTOR | Freq: Every day | SUBCUTANEOUS | 0 refills | Status: DC
  Filled 2024-07-10: qty 3, 20d supply, fill #0

## 2024-07-10 MED ORDER — MELATONIN 5 MG PO TABS
5.0000 mg | ORAL_TABLET | Freq: Every evening | ORAL | 0 refills | Status: AC | PRN
  Filled 2024-07-10: qty 30, 30d supply, fill #0

## 2024-07-10 MED ORDER — POTASSIUM CHLORIDE CRYS ER 20 MEQ PO TBCR
30.0000 meq | EXTENDED_RELEASE_TABLET | Freq: Once | ORAL | Status: AC
  Administered 2024-07-10: 30 meq via ORAL
  Filled 2024-07-10: qty 1

## 2024-07-10 MED ORDER — GLIPIZIDE ER 10 MG PO TB24
10.0000 mg | ORAL_TABLET | Freq: Every day | ORAL | 0 refills | Status: DC
  Filled 2024-07-10: qty 30, 30d supply, fill #0

## 2024-07-10 MED ORDER — INSULIN GLARGINE-YFGN 100 UNIT/ML ~~LOC~~ SOLN
16.0000 [IU] | Freq: Every day | SUBCUTANEOUS | Status: DC
  Administered 2024-07-11: 16 [IU] via SUBCUTANEOUS
  Filled 2024-07-10 (×2): qty 0.16

## 2024-07-10 MED ORDER — ROSUVASTATIN CALCIUM 20 MG PO TABS
20.0000 mg | ORAL_TABLET | Freq: Every day | ORAL | 0 refills | Status: DC
  Filled 2024-07-10: qty 30, 30d supply, fill #0

## 2024-07-10 MED ORDER — INSULIN PEN NEEDLE 32G X 4 MM MISC
0 refills | Status: AC
  Filled 2024-07-10: qty 100, 100d supply, fill #0

## 2024-07-10 MED ORDER — PANTOPRAZOLE SODIUM 40 MG PO TBEC
40.0000 mg | DELAYED_RELEASE_TABLET | Freq: Every day | ORAL | 0 refills | Status: DC
  Filled 2024-07-10: qty 30, 30d supply, fill #0

## 2024-07-10 MED ORDER — CLOPIDOGREL BISULFATE 75 MG PO TABS
75.0000 mg | ORAL_TABLET | Freq: Every day | ORAL | 0 refills | Status: DC
  Filled 2024-07-10: qty 30, 30d supply, fill #0

## 2024-07-10 MED ORDER — MAGNESIUM GLUCONATE 500 (27 MG) MG PO TABS
250.0000 mg | ORAL_TABLET | Freq: Every day | ORAL | 0 refills | Status: AC
  Filled 2024-07-10: qty 30, 60d supply, fill #0

## 2024-07-10 NOTE — Progress Notes (Signed)
 Speech Language Pathology Discharge Summary  Patient Details  Name: Monica Austin MRN: [id number] Date of Birth: 1956/08/24  Date of Discharge from SLP service:July 10, 2024  Today's Date: 07/10/2024 SLP Individual Time: 1400-1450 SLP Individual Time Calculation (min): 50 min   Skilled Therapeutic Interventions:   Skilled therapy session focused on cognitive goals. SLP facilitated session by encouraging patient to recall stroke s/sx reviewed in prior sessions. Patient recalled 5/6 s/sx independently and required supervision A for remainder. SLP then educated patient in rules for abstract puzzle game. Patient completed game targeting problem solving skills with supervision-minA. Patient also recalled cognitive and physical changes given mod i A. Patient left in bed with alarm set and call bell in reach. Continue POC.     Patient has met 4 of 5 long term goals.  Patient to discharge at overall Supervision;Min level.  Reasons goals not met: cont to require minA for problem solving   Clinical Impression/Discharge Summary: Pt has made great gains and has met 4 of 5 LTG's this admission due to improved dysphagia, communication and cognition. Pt is currently an overall supervisionA for cognitive tasks with the exception of occasionally requiring minA for problem solving. Patient requires supervision cues for utilization of swallowing compensatory strategies to minimize overt s/sx of aspiration with D3/thin diet. Pt/family education complete and pt will discharge home with supervision from friends/family/etc. Pt would benefit from f/u ST services to maximize cognition in order to maximize functional independence.     Care Partner:  Caregiver Able to Provide Assistance: Yes  Type of Caregiver Assistance: Physical;Cognitive  Recommendation:  Home Health SLP;Outpatient SLP;24 hour supervision/assistance  Rationale for SLP Follow Up: Maximize cognitive function and independence   Equipment: n/a    Reasons for discharge: Discharged from hospital   Patient/Family Agrees with Progress Made and Goals Achieved: Yes    Morine Kohlman M.A., CCC-SLP 07/10/2024, 2:25 PM

## 2024-07-10 NOTE — Progress Notes (Signed)
 Physical Therapy Discharge Summary  Patient Details  Name: Monica Austin MRN: [id number] Date of Birth: 1956-08-04  Date of Discharge from PT service:July 11, 2024  Patient has met 6 of 9 long term goals due to improved activity tolerance, improved balance, increased strength, functional use of  left upper extremity and left lower extremity, improved attention, and improved awareness.  Patient to discharge at an ambulatory level CGA.  Patient's care partner is independent to provide the necessary physical and cognitive assistance at discharge.  Reasons goals not met: Pt requires CGA/close supervision for gait with no AD for pt stability and overall safety.   Recommendation:  Patient will benefit from ongoing skilled PT services in outpatient setting to continue to advance safe functional mobility, address ongoing impairments in strength, coordination, balance, activity tolerance, cognition, safety awareness, and to minimize fall risk.  Equipment: RW  Reasons for discharge: treatment goals met and discharge from hospital  Patient/family agrees with progress made and goals achieved: Yes  PT Discharge Precautions/Restrictions Precautions Precautions: Fall Precaution/Restrictions Comments: L hemiparesis, reduced awareness Restrictions Weight Bearing Restrictions Per Provider Order: No Pain Interference Pain Interference Pain Effect on Sleep: 1. Rarely or not at all Pain Interference with Therapy Activities: 1. Rarely or not at all Pain Interference with Day-to-Day Activities: 1. Rarely or not at all Vision/Perception  Vision - History Ability to See in Adequate Light: 0 Adequate Perception Perception: Impaired Preception Impairment Details: Inattention/Neglect Praxis Praxis: Impaired Praxis Impairment Details: Motor planning Praxis-Other Comments: reduced awareness to L side/ environment and slowed L motor planning  Cognition Overall Cognitive Status: Impaired/Different  from baseline Arousal/Alertness: Awake/alert Orientation Level: Oriented X4 Sustained Attention: Impaired Selective Attention: Impaired Memory: Impaired Awareness: Impaired Problem Solving: Impaired Behaviors: Impulsive Safety/Judgment: Impaired Sensation Sensation Light Touch: Impaired by gross assessment (improved but continued from eval) Coordination Gross Motor Movements are Fluid and Coordinated: No Fine Motor Movements are Fluid and Coordinated: No Coordination and Movement Description: decreased coordination especially with L hemibody improving since eval Motor  Motor Motor: Abnormal postural alignment and control;Other (comment) (hemipareisis) Motor - Discharge Observations: decreased coordination; improving strength/ proprioception but with continued L weaker than R  Mobility Bed Mobility Bed Mobility: Rolling Right;Rolling Left;Supine to Sit;Sit to Supine Rolling Right: Independent;Independent with assistive device Rolling Left: Independent;Independent with assistive device Supine to Sit: Independent with assistive device Sit to Supine: Independent Transfers Transfers: Sit to Stand;Stand to Sit;Stand Pivot Transfers Sit to Stand: Supervision/Verbal cueing Stand to Sit: Supervision/Verbal cueing Stand Pivot Transfers: Supervision/Verbal cueing Stand Pivot Transfer Details: Verbal cues for precautions/safety Transfer (Assistive device): None Locomotion  Gait Ambulation: Yes Gait Assistance: Supervision/Verbal cueing;Contact Guard/Touching assist Gait Distance (Feet): 800 Feet Assistive device: None (pushed w/c for part of distance) Gait Assistance Details: Verbal cues for safe use of DME/AE;Verbal cues for precautions/safety;Verbal cues for gait pattern Gait Assistance Details: decreased Gait Gait: Yes Gait Pattern: Impaired Gait Pattern: Step-through pattern;Trunk flexed;Poor foot clearance - left;Narrow base of support Gait velocity: overall decreased for age,  but too fast for safety awareness Stairs / Additional Locomotion Stairs: Yes Stairs Assistance: Supervision/Verbal cueing Stair Management Technique: Two rails Number of Stairs: 12 Height of Stairs: 6 Wheelchair Mobility Wheelchair Mobility: No  Trunk/Postural Assessment  Cervical Assessment Cervical Assessment: Exceptions to [facility] (forward head) Thoracic Assessment Thoracic Assessment: Exceptions to [facility] (rounded shoulders) Lumbar Assessment Lumbar Assessment: Exceptions to [facility] (significant hyperlordosis with increased hip flexion and anterior pelvic rotation) Postural Control Postural Control: Deficits on evaluation Righting Reactions: delayed - slight improvement from  eval Protective Responses: delayed - improved from eval  Balance  Balance Balance Assessed: Yes Standardized Balance Assessment Standardized Balance Assessment: Berg Balance Test Berg Balance Test Sit to Stand: Able to stand  independently using hands Standing Unsupported: Able to stand safely 2 minutes Sitting with Back Unsupported but Feet Supported on Floor or Stool: Able to sit safely and securely 2 minutes Stand to Sit: Sits safely with minimal use of hands Transfers: Able to transfer safely, minor use of hands Standing Unsupported with Eyes Closed: Able to stand 10 seconds safely Standing Ubsupported with Feet Together: Able to place feet together independently and stand 1 minute safely From Standing, Reach Forward with Outstretched Arm: Can reach forward >12 cm safely (5) From Standing Position, Pick up Object from Floor: Able to pick up shoe, needs supervision From Standing Position, Turn to Look Behind Over each Shoulder: Looks behind one side only/other side shows less weight shift Turn 360 Degrees: Able to turn 360 degrees safely but slowly Standing Unsupported, Alternately Place Feet on Step/Stool: Able to complete >2 steps/needs minimal assist Standing Unsupported, One Foot in Front: Loses balance  while stepping or standing Standing on One Leg: Able to lift leg independently and hold 5-10 seconds Total Score: 42 Static Sitting Balance Static Sitting - Balance Support: Bilateral upper extremity supported;Feet supported Static Sitting - Level of Assistance: 7: Independent Dynamic Sitting Balance Dynamic Sitting - Balance Support: Feet supported;No upper extremity supported Dynamic Sitting - Level of Assistance: 6: Modified independent (Device/Increase time) Static Standing Balance Static Standing - Balance Support: No upper extremity supported;During functional activity Static Standing - Level of Assistance: 5: Stand by assistance (supervision) Dynamic Standing Balance Dynamic Standing - Balance Support: No upper extremity supported;During functional activity Dynamic Standing - Level of Assistance: 5: Stand by assistance (supervision) Dynamic Standing - Comments: with transfers and short distance ambulation Extremity Assessment  RLE Assessment RLE Assessment: Within Functional Limits LLE Assessment LLE Assessment: Exceptions to [facility] Strength Comments: grossly 4+/5   Monica Austin PT, DPT, CSRS Monica Austin PT, DPT 07/09/2024, 6:43 PM

## 2024-07-10 NOTE — Progress Notes (Signed)
 Physical Therapy Session Note  Patient Details  Name: Monica Austin MRN: [id number] Date of Birth: Dec 03, 1956  Today's Date: 07/10/2024 PT Individual Time: 1116-1200 PT Individual Time Calculation (min): 44 min   Short Term Goals: Week 2:  PT Short Term Goal 1 (Week 2): STG = LTG due to ELOS  Skilled Therapeutic Interventions/Progress Updates:      Pt seated in recliner upon arrival. Pt agreeable to therapy. Pt denies any pain. Pt overall very fatigued post outing this morning.   Pt transported dependent in [facility] to main gym for time/energy conservation.   Five times Sit to Stand Test (FTSS) Method: Use a straight back chair with a solid seat that is 16-18" high. Ask participant to sit on the chair with arms folded across their chest.   Instructions: "Stand up and sit down as quickly as possible 5 times, keeping your arms folded across your chest."   Measurement: Stop timing when the participant stands the 5th time.  TIME: __18.83____ (in seconds); pt requiring 25 seconds at eval   Times > 13.6 seconds is associated with increased disability and morbidity (Guralnik, 2000) Times > 15 seconds is predictive of recurrent falls in healthy individuals aged 29 and older (Buatois, et al., 2008) Normal performance values in community dwelling individuals aged 41 and older (Bohannon, 2006): 60-69 years: 11.4 seconds 70-79 years: 12.6 seconds 80-89 years: 14.8 seconds  MCID: >= 2.3 seconds for Vestibular Disorders (Meretta, 2006)   Timed up and Go completed in 16.65 seconds with no AD with close supervision for safety. Scores > 15 seconds indicate increased falls risk.   Pt navigated 12 steps with B HR and reciprocal gait with supervision, verbal cues provided intermittently for LE positioning.   Pt ambulated 200 feet with RW and close supervision/CGA espeically with navigating turns.   Pt performed sit to stand, stand pivot transfer with no AD and supervision.   Pt supine in bed  with all needs within reach and bed alarm on.        Therapy Documentation Precautions:  Precautions Precautions: Fall Precaution/Restrictions Comments: L hemiparesis, reduced awareness Restrictions Weight Bearing Restrictions Per Provider Order: No  Therapy/Group: Individual Therapy  [facility] Doreene Orris, [place], DPT  07/10/2024, 12:41 PM

## 2024-07-10 NOTE — Progress Notes (Signed)
 Occupational Therapy Session Note  Patient Details  Name: Monica Austin MRN: [id number] Date of Birth: January 20, 1956  Today's Date: 07/10/2024  OT Individual Time 845-930 OT individual time calculation : OT Individual Time: 0930-1100 OT Individual Time Calculation (min): 90 min    Short Term Goals: Week 1:  OT Short Term Goal 1 (Week 1): patient will demonstrate increased transfers completing with CGA following taught strategies to improve balance OT Short Term Goal 1 - Progress (Week 1): Met OT Short Term Goal 2 (Week 1): patient will complete bilateral UE activity with 1-2 verbal cues for placement. OT Short Term Goal 2 - Progress (Week 1): Met OT Short Term Goal 3 (Week 1): patient will complete LB dressing with CGA to increase dynamic sitting and standing balane OT Short Term Goal 3 - Progress (Week 1): Met OT Short Term Goal 4 (Week 1): patient will demonstrate increased safety awareness verbalizing and completing 2 safety strategies with 2 or fewer cues. OT Short Term Goal 4 - Progress (Week 1): Met Week 2:  OT Short Term Goal 1 (Week 2): STG=LTG due to LOS Week 3:     Skilled Therapeutic Interventions/Progress Updates:    1:1 Pt received sitting EOB applying body lotion after showering with the NT. Pt continued to dress with close supervision for attention to the left. Pt ambulating around the room with contact guard without AD.Pt continues to benefit from cuing through session for safety awareness and functional problem solving.  1:1 Community integration with REc therapy. Pt had chosen to go shopping (at [employer]). Focus on goals set for the outing including car transfer into the front seat of the [place], navigating in the community and across parking lot without AD, navigating up and down the curb, opening and walking through doors, navigating around the store attending to busy and distracting environment, dynamic standing balance, carrying her purse and items to  purchase around the store, picking items off the floor, simple money management with mental math, left visual attention in environment etc. Pt requires contact guard through the outing without AD for balance, mod cues for attention to left environmental field. Pt was very fatigued towards the end of the outing. Pt requires min to mod cues for safety awareness in the experience. Did discuss energy conservation and limited extended outing times when d/c home with family.   Also discussed transitioning home and what IADLs will look like with A/ support.   Therapy Documentation Precautions:  Precautions Precautions: Fall Precaution/Restrictions Comments: L hemiparesis, reduced awareness Restrictions Weight Bearing Restrictions Per Provider Order: No  Pain:  1st session: no c/o pain 2nd session: no c/o pain   Therapy/Group: Individual Therapy  Claudene Nest [facility] 07/10/2024, 12:30 PM

## 2024-07-10 NOTE — Progress Notes (Signed)
 Patient ID: Monica Austin, female   DOB: Oct 04, 1956, 68 y.o.   MRN: [id number]  Have ordered rolling walker and tub bench pt is aware of the cost of the tub bench. Will schedule family training for tomorrow and see if here for this. Daughter's [facility] papers sent back to her this am-8:30 am

## 2024-07-10 NOTE — Progress Notes (Signed)
 PROGRESS NOTE   Subjective/Complaints: Discussed that she is going on outing today- she is excited! Discussed elevated Cbgs, increasing semglee  to 16U  Patient's chart reviewed- No issues reported overnight Vitals signs stable   ROS:    Pt denies SOB, abd pain, CP, N/V/C/D, and vision changes   Objective:   No results found.  Recent Labs    07/08/24 0520 07/10/24 0439  WBC 6.8 6.9  HGB 11.5* 11.1*  HCT 32.9* 32.7*  PLT 228 223    Recent Labs    07/08/24 2113 07/10/24 0439  NA  --  138  K  --  3.7  CL  --  105  CO2  --  24  GLUCOSE 319* 143*  BUN  --  12  CREATININE  --  0.92  CALCIUM   --  9.1     Intake/Output Summary (Last 24 hours) at 07/10/2024 9062 Last data filed at 07/10/2024 0826 Gross per 24 hour  Intake 560 ml  Output --  Net 560 ml        Physical Exam: Vital Signs Blood pressure (!) 145/75, pulse 74, temperature 98 F (36.7 C), temperature source Oral, resp. rate 17, height 5' 7 (1.702 m), weight 82.2 kg, SpO2 96%.     General: asleep, woke to stimuli; supine in bed; NAD HENT: conjugate gaze; oropharynx moist CV: bradcyardia Pulmonary: CTA B/L; no W/R/R- good air movement GI: soft, NT, ND, (+)BS Psychiatric: appropriate- but sleepy Neurological: sleepy- just woke her up  Ext: no clubbing, cyanosis, or edema  Skin: intact Musculoskeletal:     Cervical back: Neck supple. No tenderness.      Neuro: RUE 5/5 thorughout LUE- ~4/5 in all muscles except FA which is 2+ to 3-/5 RLE- 5/5 throughout LLE- 4+ throughout, stable 7/17    Comments: Left facial weakness with mild dysarthria. Able to answer orientation questions without difficulty and follow simple one and two step commands. Fair insight and awareness. Oriented to hospital, month, aware of situation  Intact to light touch in all 4 extremities Functional mobility: standing and ambulating S      Assessment/Plan: 1.  Functional deficits which require 3+ hours per day of interdisciplinary therapy in a comprehensive inpatient rehab setting. Physiatrist is providing close team supervision and 24 hour management of active medical problems listed below. Physiatrist and rehab team continue to assess barriers to discharge/monitor patient progress toward functional and medical goals  Care Tool:  Bathing    Body parts bathed by patient: Left upper leg, Right arm, Left arm, Right lower leg, Chest, Abdomen, Left lower leg, Face, Front perineal area, Right upper leg, Buttocks   Body parts bathed by helper: Buttocks     Bathing assist Assist Level: Supervision/Verbal cueing     Upper Body Dressing/Undressing Upper body dressing   What is the patient wearing?: Pull over shirt    Upper body assist Assist Level: Set up assist    Lower Body Dressing/Undressing Lower body dressing      What is the patient wearing?: Pants, Underwear/pull up     Lower body assist Assist for lower body dressing: Supervision/Verbal cueing     Toileting Toileting Toileting Activity did  not occur Press photographer and hygiene only): N/A (no void or bm)  Toileting assist Assist for toileting: Contact Guard/Touching assist     Transfers Chair/bed transfer  Transfers assist     Chair/bed transfer assist level: Supervision/Verbal cueing     Locomotion Ambulation   Ambulation assist      Assist level: Supervision/Verbal cueing Assistive device: No Device Max distance: 800 ft   Walk 10 feet activity   Assist     Assist level: Supervision/Verbal cueing Assistive device: No Device   Walk 50 feet activity   Assist    Assist level: Supervision/Verbal cueing (SBA) Assistive device: No Device    Walk 150 feet activity   Assist Walk 150 feet activity did not occur:  (fatigue)  Assist level: Supervision/Verbal cueing Assistive device: No Device    Walk 10 feet on uneven surface   activity   Assist     Assist level: Contact Guard/Touching assist Assistive device: Other (comment) (no device)   Wheelchair     Assist Is the patient using a wheelchair?: No Type of Wheelchair: Manual Wheelchair activity did not occur: N/A  Wheelchair assist level: Dependent - Patient 0%      Wheelchair 50 feet with 2 turns activity    Assist    Wheelchair 50 feet with 2 turns activity did not occur: N/A   Assist Level: Dependent - Patient 0%   Wheelchair 150 feet activity     Assist  Wheelchair 150 feet activity did not occur: N/A   Assist Level: Dependent - Patient 0%   Blood pressure (!) 145/75, pulse 74, temperature 98 F (36.7 C), temperature source Oral, resp. rate 17, height 5' 7 (1.702 m), weight 82.2 kg, SpO2 96%.  Medical Problem List and Plan: 1. 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  Antithrombotics: -DVT/anticoagulation:  Pharmaceutical: continue Lovenox              -antiplatelet therapy: DAPT X 3 months followed by ASA  3. Pain Management: tylenol  prn.   4. Mood/Behavior/Sleep: LCSW to follow for evaluation and support.              -antipsychotic agents: N/A  5. Neuropsych/cognition: This patient is capable of making decisions on her own behalf.  6. Skin/Wound Care: Routine pressure relief measures.   7. Fluids/Electrolytes/Nutrition: improved po   8. E coli UTI: Keflex  antibiotic  Done with Keflex  for UTI  9. Hypotension fluctuating with hypertension: d/c Toprol  given bradycardia. Avoid hypotension with evidence of intracranial stenosis. continue magnesium  supplement           10.  T2DM: Hgb  A1c- 9.7. Monitor BS ac/hs and use SSI for elevated BS. Add CM restrictions to diet.             --Was on metformin  PTA with poor control.  --now on Insulin  glargline. decrease SSI to sensitive.               --RD  consulted for education on carb modified diet and Rehab RN for insulin  education.   -provide list of foods for diabetes  -CBGs reviewed  7/11-stopped metformin  d/t nausea--cbg's increasing a bit now that she's eating more as well.    -began low dose glucotrol ---increased to 5mg  xl 7/10   -continue same semglee    -will need further titration of glucotrol --increase to 10mg  xl effective 7/12  7/12- pt doesn't want to go home on insulin , but explained will probably need some until her BG's become better controlled- will need to f/u with PCP- Cbg's still in low 200's- glucotrol  started this AM- cannot titrate dose yet  Increase insulin  to 16U CBG (last 3)  Recent Labs    07/09/24 1725 07/09/24 2122 07/10/24 0622  GLUCAP 215* 254* 159*    11. Dyslipidemia: continue Crestor .   12.  Mild intermittent asthma: continue Singulair   13. Thyroid nodules/enlargement: Ultrasound for work up after discharge.   14. Nausea:d/t metformin ---stopped  - resolved   -push po/fluids.  15. Fibroid uterus: refer for outpatient OB/GYN f/u    LOS: 14 days A FACE TO FACE EVALUATION WAS PERFORMED  Sven SQUIBB Jylan Loeza 07/10/2024, 9:37 AM

## 2024-07-10 NOTE — Progress Notes (Signed)
 Inpatient Rehabilitation Discharge Medication Review by a Pharmacist  A complete drug regimen review was completed for this patient to identify any potential clinically significant medication issues.  High Risk Drug Classes Is patient taking? Indication by Medication  Antipsychotic No    Anticoagulant No    Antibiotic No   Opioid No   Antiplatelet Yes bASA, clopidogrel  (DAPT for 3 months followed by bASA alone) - CVA   Hypoglycemics/insulin  Yes Glipizide , glargine - DM   Vasoactive Medication No   Chemotherapy No   Other Yes Montelukast  - asthma Rosuvastatin  - HLD  Melatonin - sleep Miralax  - constipation Calcium , magnesium  - supplement Pantoprazole  - GERD     Type of Medication Issue Identified Description of Issue Recommendation(s)  Drug Interaction(s) (clinically significant)     Duplicate Therapy     Allergy     No Medication Administration End Date     Incorrect Dose     Additional Drug Therapy Needed     Significant med changes from prior encounter (inform family/care partners about these prior to discharge). PTA medications resumed  Dose changes - rosuvastatin    Communicate relevant medication changes to patient/family members at discharge from CIR.    Other       Clinically significant medication issues were identified that warrant physician communication and completion of prescribed/recommended actions by midnight of the next day:  No   Time spent performing this drug regimen review (minutes):  20  Thank you for involving pharmacy in this patient's care.  Delon Sax, PharmD, BCPS Clinical Pharmacist Clinical phone for 07/10/2024 is [phone] 07/10/2024 3:03 PM

## 2024-07-10 NOTE — Plan of Care (Signed)
  Problem: RH Swallowing Goal: LTG Patient will consume least restrictive diet using compensatory strategies with assistance (SLP) Description: LTG:  Patient will consume least restrictive diet using compensatory strategies with assistance (SLP) Outcome: Completed/Met   Problem: RH Expression Communication Goal: LTG Patient will increase speech intelligibility (SLP) Description: LTG: Patient will increase speech intelligibility at word/phrase/conversation level with cues, % of the time (SLP) Outcome: Completed/Met   Problem: RH Attention Goal: LTG Patient will demonstrate this level of attention during functional activites (SLP) Description: LTG:  Patient will will demonstrate this level of attention during functional activites (SLP) Outcome: Completed/Met   Problem: RH Awareness Goal: LTG: Patient will demonstrate awareness during functional activites type of (SLP) Description: LTG: Patient will demonstrate awareness during functional activites type of (SLP) Outcome: Completed/Met   Problem: RH Problem Solving Goal: LTG Patient will demonstrate problem solving for (SLP) Description: LTG:  Patient will demonstrate problem solving for basic/complex daily situations with cues  (SLP) Outcome: Not Met (cont to require minA)

## 2024-07-11 LAB — GLUCOSE, CAPILLARY: Glucose-Capillary: 154 mg/dL — ABNORMAL HIGH (ref 70–99)

## 2024-07-11 NOTE — Progress Notes (Signed)
 Occupational Therapy Discharge Summary  Patient Details  Name: Monica Austin MRN: [id number] Date of Birth: 04/05/1956  Date of Discharge from OT service:July 11, 2024   Patient has met 11 of 11 long term goals due to improved activity tolerance, improved balance, postural control, ability to compensate for deficits, functional use of  LEFT upper and LEFT lower extremity, improved attention, and improved coordination.  Patient to discharge at overall superision level for basic ADls.  Patient's care partner is independent to provide the necessary physical and cognitive assistance at discharge.  Pt is contact guard for functional mobility without AD. With Trials of use of RW she is more unsafe with the RW functionally than with it. Family has been educated to remain with pt when up ambulating and supervision with functional tasks (ADL/IADL). Pt still requires cues for attention to left visual field and safety awareness with functional activities.   Reasons goals not met: n/a  Recommendation:  Patient will benefit from ongoing skilled OT services in outpatient setting to continue to advance functional skills in the area of BADL and iADL.  Equipment: Tub bench   Reasons for discharge: treatment goals met and discharge from hospital  Patient/family agrees with progress made and goals achieved: Yes  OT Discharge Precautions/Restrictions  Precautions Precautions: Fall Precaution/Restrictions Comments: L hemiparesis, reduced awareness Restrictions Weight Bearing Restrictions Per Provider Order: No General   Vital Signs   Pain   ADL ADL Eating: Modified independent Grooming: Modified independent Where Assessed-Grooming: Standing at sink Upper Body Bathing: Supervision/safety Where Assessed-Upper Body Bathing: Shower Lower Body Bathing: Supervision/safety Where Assessed-Lower Body Bathing: Shower Upper Body Dressing: Supervision/safety Where Assessed-Upper Body Dressing: Edge of  bed Lower Body Dressing: Supervision/safety Where Assessed-Lower Body Dressing: Edge of bed Toileting: Supervision/safety Toilet Transfer: Close supervision Toilet Transfer Method: Proofreader: Psychiatric nurse: Administrator, arts Method: Designer, industrial/product: Information systems manager with back Vision Baseline Vision/History: 1 Wears glasses Patient Visual Report: No change from baseline Additional Comments: inattention to the left visual field Perception  Perception: Impaired Praxis   Cognition Cognition Overall Cognitive Status: Impaired/Different from baseline Arousal/Alertness: Awake/alert Orientation Level: Person;Place;Situation Person: Oriented Place: Oriented Situation: Oriented Memory: Impaired Memory Impairment: Decreased recall of new information Attention: Sustained;Selective Sustained Attention: Appears intact Selective Attention: Impaired Selective Attention Impairment: Functional complex Awareness: Impaired Awareness Impairment: Anticipatory impairment Problem Solving: Impaired Reasoning: Impaired Reasoning Impairment: Functional complex Sequencing: Impaired Sequencing Impairment: Functional basic Organizing: Impaired Organizing Impairment: Functional complex Behaviors: Impulsive Safety/Judgment: Impaired Sensation Sensation Light Touch: Impaired by gross assessment Coordination Gross Motor Movements are Fluid and Coordinated: No Fine Motor Movements are Fluid and Coordinated: Yes Coordination and Movement Description: decreased coordination especially with L hemibody improving since eval Finger Nose Finger Test: slow with accuracy Motor  Motor Motor - Discharge Observations: decreased coordination; improving strength/ proprioception but with continued L weaker than R Mobility  Bed Mobility Rolling Right: Independent;Independent with assistive device Rolling Left:  Independent;Independent with assistive device Supine to Sit: Independent with assistive device Sit to Supine: Independent Transfers Sit to Stand: Supervision/Verbal cueing Stand to Sit: Supervision/Verbal cueing  Trunk/Postural Assessment  Cervical Assessment Cervical Assessment: Exceptions to [facility] (forward head) Thoracic Assessment Thoracic Assessment:  (rounded shoulders) Lumbar Assessment Lumbar Assessment:  (posterior pelvic tilt) Postural Control Postural Control: Deficits on evaluation Righting Reactions: delayed - slight improvement from eval Protective Responses: delayed - improved from eval  Balance Balance Balance Assessed: Yes Standardized Balance Assessment Standardized Balance Assessment: Lars Balance  Test Berg Balance Test Sit to Stand: Able to stand  independently using hands Standing Unsupported: Able to stand safely 2 minutes Sitting with Back Unsupported but Feet Supported on Floor or Stool: Able to sit safely and securely 2 minutes Stand to Sit: Sits safely with minimal use of hands Transfers: Able to transfer safely, minor use of hands Standing Unsupported with Eyes Closed: Able to stand 10 seconds safely Standing Ubsupported with Feet Together: Able to place feet together independently and stand 1 minute safely From Standing, Reach Forward with Outstretched Arm: Can reach forward >12 cm safely (5) From Standing Position, Pick up Object from Floor: Able to pick up shoe, needs supervision From Standing Position, Turn to Look Behind Over each Shoulder: Looks behind one side only/other side shows less weight shift Turn 360 Degrees: Able to turn 360 degrees safely but slowly Standing Unsupported, Alternately Place Feet on Step/Stool: Able to complete >2 steps/needs minimal assist Standing Unsupported, One Foot in Front: Loses balance while stepping or standing Standing on One Leg: Able to lift leg independently and hold 5-10 seconds Total Score: 42 Static Sitting  Balance Static Sitting - Balance Support: Bilateral upper extremity supported;Feet supported Static Sitting - Level of Assistance: 7: Independent Dynamic Sitting Balance Dynamic Sitting - Balance Support: Feet supported;No upper extremity supported Dynamic Sitting - Level of Assistance: 6: Modified independent (Device/Increase time) Static Standing Balance Static Standing - Balance Support: No upper extremity supported;During functional activity Static Standing - Level of Assistance: 5: Stand by assistance Dynamic Standing Balance Dynamic Standing - Balance Support: No upper extremity supported;During functional activity Dynamic Standing - Level of Assistance: 5: Stand by assistance Dynamic Standing - Comments: with transfers and short distance ambulation Extremity/Trunk Assessment RUE Assessment RUE Assessment: Within Functional Limits General Strength Comments: 4-5/5 LUE Assessment Active Range of Motion (AROM) Comments: AROM WFL- slight decrease from R side. AROM 160 General Strength Comments: 3+ to 4-/5   Claudene Nest [facility] 07/11/2024, 8:43 AM

## 2024-07-11 NOTE — Progress Notes (Signed)
 Occupational Therapy Session Note  Patient Details  Name: Monica Austin MRN: [id number] Date of Birth: [birth year]  Today's Date: 07/11/2024 OT Individual Time: 9069-9054 OT Individual Time Calculation (min): 15 min    Short Term Goals: Week 2:  OT Short Term Goal 1 (Week 2): STG=LTG due to LOS  Skilled Therapeutic Interventions/Progress Updates:    Patient scheduled for family education - daughter called to say she would be in before 10.  Checked back with patient in 15 min and son in room.  Reviewed need for supervision with BADL, IADL,  medication management, and standing and walking on left side as patient has decreased attention to left side.  Patient's son given opportunity to ask any questions.  Patient left seated on edge of bed - very eager to go home.    Therapy Documentation Precautions:  Precautions Precautions: Fall Precaution/Restrictions Comments: L hemiparesis, reduced awareness Restrictions Weight Bearing Restrictions Per Provider Order: No   Pain:  No pain     Therapy/Group: Individual Therapy  Kamerin Grumbine M 07/11/2024, 12:33 PM

## 2024-07-11 NOTE — Progress Notes (Signed)
 PROGRESS NOTE   Subjective/Complaints: No new complaints this morning She loved her outing yesterday Discussed her CBGs, provided dietary education  Patient's chart reviewed- No issues reported overnight Vitals signs stable   ROS:    Pt denies SOB, abd pain, CP, N/V/C/D, and vision changes   Objective:   No results found.  Recent Labs    07/10/24 0439  WBC 6.9  HGB 11.1*  HCT 32.7*  PLT 223    Recent Labs    07/08/24 2113 07/10/24 0439  NA  --  138  K  --  3.7  CL  --  105  CO2  --  24  GLUCOSE 319* 143*  BUN  --  12  CREATININE  --  0.92  CALCIUM   --  9.1     Intake/Output Summary (Last 24 hours) at 07/11/2024 0928 Last data filed at 07/11/2024 0852 Gross per 24 hour  Intake 1142 ml  Output --  Net 1142 ml        Physical Exam: Vital Signs Blood pressure 132/67, pulse 61, temperature 97.6 F (36.4 C), resp. rate 18, height 5' 7 (1.702 m), weight 82.2 kg, SpO2 95%.     General: asleep, woke to stimuli; supine in bed; NAD HENT: conjugate gaze; oropharynx moist CV: bradcyardia Pulmonary: CTA B/L; no W/R/R- good air movement GI: soft, NT, ND, (+)BS Psychiatric: appropriate- but sleepy Neurological: sleepy- just woke her up  Ext: no clubbing, cyanosis, or edema  Skin: intact Musculoskeletal:     Cervical back: Neck supple. No tenderness.      Neuro: RUE 5/5 thorughout LUE- ~4/5 in all muscles except FA which is 3-/5 RLE- 5/5 throughout LLE- 4+ throughout, improved as above 7/18    Comments: Left facial weakness with mild dysarthria. Able to answer orientation questions without difficulty and follow simple one and two step commands. Fair insight and awareness. Oriented to hospital, month, aware of situation  Intact to light touch in all 4 extremities Functional mobility: standing and ambulating S      Assessment/Plan: 1. Functional deficits which require 3+ hours per day of  interdisciplinary therapy in a comprehensive inpatient rehab setting. Physiatrist is providing close team supervision and 24 hour management of active medical problems listed below. Physiatrist and rehab team continue to assess barriers to discharge/monitor patient progress toward functional and medical goals  Care Tool:  Bathing    Body parts bathed by patient: Right arm, Chest, Left arm, Abdomen, Front perineal area, Buttocks, Left upper leg, Right upper leg, Right lower leg, Left lower leg, Face   Body parts bathed by helper: Buttocks     Bathing assist Assist Level: Supervision/Verbal cueing     Upper Body Dressing/Undressing Upper body dressing   What is the patient wearing?: Dress    Upper body assist Assist Level: Supervision/Verbal cueing    Lower Body Dressing/Undressing Lower body dressing      What is the patient wearing?: Underwear/pull up     Lower body assist Assist for lower body dressing: Supervision/Verbal cueing     Toileting Toileting Toileting Activity did not occur (Clothing management and hygiene only): N/A (no void or bm)  Toileting assist Assist  for toileting: Supervision/Verbal cueing     Transfers Chair/bed transfer  Transfers assist     Chair/bed transfer assist level: Supervision/Verbal cueing     Locomotion Ambulation   Ambulation assist      Assist level: Contact Guard/Touching assist Assistive device: No Device Max distance: 1000   Walk 10 feet activity   Assist     Assist level: Contact Guard/Touching assist Assistive device: No Device   Walk 50 feet activity   Assist    Assist level: Contact Guard/Touching assist Assistive device: No Device    Walk 150 feet activity   Assist Walk 150 feet activity did not occur:  (fatigue)  Assist level: Contact Guard/Touching assist Assistive device: No Device    Walk 10 feet on uneven surface  activity   Assist     Assist level: Contact Guard/Touching  assist Assistive device: Other (comment) (no device)   Wheelchair     Assist Is the patient using a wheelchair?: No Type of Wheelchair: Manual Wheelchair activity did not occur: N/A  Wheelchair assist level: Dependent - Patient 0%      Wheelchair 50 feet with 2 turns activity    Assist    Wheelchair 50 feet with 2 turns activity did not occur: N/A   Assist Level: Dependent - Patient 0%   Wheelchair 150 feet activity     Assist  Wheelchair 150 feet activity did not occur: N/A   Assist Level: Dependent - Patient 0%   Blood pressure 132/67, pulse 61, temperature 97.6 F (36.4 C), resp. rate 18, height 5' 7 (1.702 m), weight 82.2 kg, SpO2 95%.  Medical Problem List and Plan: 1. 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  Antithrombotics: -DVT/anticoagulation:  Pharmaceutical: d/c lovenox              -antiplatelet therapy: DAPT X 3 months followed by ASA  3. Pain Management: tylenol  prn.   4. Mood/Behavior/Sleep: LCSW to follow for evaluation and support.              -antipsychotic agents: N/A  5. Neuropsych/cognition: This patient is capable of making decisions on her own behalf.  6. Skin/Wound Care: Routine pressure relief measures.   7. Fluids/Electrolytes/Nutrition: improved po   8. E coli UTI: Keflex  antibiotic  Done with Keflex  for UTI  9. Hypotension fluctuating with hypertension: d/c Toprol  given bradycardia. Avoid hypotension with evidence of intracranial stenosis. continue magnesium  supplement, discussed Bps with patient          10.  T2DM: Hgb A1c- 9.7. Monitor BS ac/hs and use SSI for elevated BS. Add CM restrictions to diet.             --Was on  metformin  PTA with poor control.  --now on Insulin  glargline. decrease SSI to sensitive.               --RD consulted for education on carb modified diet and Rehab RN for insulin  education.   -provide list of foods for diabetes  -CBGs  reviewed  7/11-stopped metformin  d/t nausea--cbg's increasing a bit now that she's eating more as well.    -began low dose glucotrol ---increased to 5mg  xl 7/10   -continue same semglee    -will need further titration of glucotrol --increase to 10mg  xl effective 7/12  7/12- pt doesn't want to go home on insulin , but explained will probably need some until her BG's become better controlled- will need to f/u with PCP- Cbg's still in low 200's- glucotrol  started this AM- cannot titrate dose yet  Increase insulin  to 16U, provided dietary education CBG (last 3)  Recent Labs    07/10/24 1633 07/10/24 2101 07/11/24 0607  GLUCAP 179* 168* 154*    11. Dyslipidemia: continue Crestor .   12.  Mild intermittent asthma: continue Singulair   13. Thyroid nodules/enlargement: Ultrasound for work up after discharge.   14. Nausea:d/t metformin ---stopped  - resolved   -push po/fluids.  15. Fibroid uterus: refer for outpatient OB/GYN f/u    >30 minutes spent in discharge of patient including review of medications and follow-up appointments, physical examination, and in answering all patient's questions  LOS: 15 days A FACE TO FACE EVALUATION WAS PERFORMED  Sven P Germain Koopmann 07/11/2024, 9:28 AM

## 2024-07-11 NOTE — Plan of Care (Signed)
   Problem: Coping: Goal: Ability to adjust to condition or change in health will improve Outcome: Progressing   Problem: Fluid Volume: Goal: Ability to maintain a balanced intake and output will improve Outcome: Progressing

## 2024-07-11 NOTE — Progress Notes (Signed)
 Inpatient Rehabilitation Care Coordinator Discharge Note   Patient Details  Name: Monica Austin MRN: [id number] Date of Birth: January 05, 1956   Discharge location: HOME WITH FAMILY MEMBERS PROVIDING 24/7 SUPERVISION  Length of Stay: 15 DAYS  Discharge activity level: SUPERVISION WITH CUES  Home/community participation: ACTIVE  Patient response un:Yzjouy Literacy - How often do you need to have someone help you when you read instructions, pamphlets, or other written material from your doctor or pharmacy?: Never  Patient response un:Dnrpjo Isolation - How often do you feel lonely or isolated from those around you?: Never  Services provided included: MD, RD, PT, OT, SLP, RN, CM, TR, Pharmacy, Neuropsych, SW  Financial Services:  Field seismologist Utilized: Barrister's clerk  Choices offered to/list presented to: PT  Follow-up services arranged:  Home Health, DME, Patient/Family has no preference for HH/DME agencies Home Health Agency: [facility]  PT  OT  SP  AIDE    PT HAS DECIDED TO GET EQUIPMENT ON HER OWN SON IS AWARE AND PRESENT IN HER ROOM   PRIVATE DUTY AGENCY LIST GIVEN TO SON TO PURSUE IF NEEDED  Patient response to transportation need: Is the patient able to respond to transportation needs?: Yes In the past 12 months, has lack of transportation kept you from medical appointments or from getting medications?: No In the past 12 months, has lack of transportation kept you from meetings, work, or from getting things needed for daily living?: No   Patient/Family verbalized understanding of follow-up arrangements:  Yes  Individual responsible for coordination of the follow-up plan: Monica Austin [phone]  Confirmed correct DME delivered: Monica Austin 07/11/2024    Comments (or additional information):PT DID WELL AND MAKE GOOD PROGRESS WENT ON OUTING AND DID WELL. DAUGHTER'S FMLA PAPERWORK COMPLETED AND RETURNED.  Summary of Stay    Date/Time  Discharge Planning CSW  07/09/24 [phone] Between daughter, grandson, friend and another family member will provide 24/7 supervision. Daughter to have FMLA papers completed. Awaiting if daughter will be here Thursday or Friday for family education RGD  07/02/24 0904 Home daughter to come from [place]  to assist with care via FMLA. Pt hopes to be mod/i level. First conference will update target dc date RGD       Monica Austin, Monica Austin

## 2024-07-11 NOTE — Progress Notes (Signed)
 Physical Therapy Session Note  Patient Details  Name: Monica Austin MRN: [id number] Date of Birth: Mar 08, 1956  Today's Date: 07/11/2024 PT Individual Time: 1015-1032 PT Individual Time Calculation (min): 17 min   Short Term Goals: Week 1:  PT Short Term Goal 1 (Week 1): pt will ambulate 150 feet with LRAD and CGA PT Short Term Goal 1 - Progress (Week 1): Met PT Short Term Goal 2 (Week 1): pt will perform bed to chair transfer with LRAD and CGA PT Short Term Goal 2 - Progress (Week 1): Met PT Short Term Goal 3 (Week 1): pt will navigate 12 steps with LRAD and CGA PT Short Term Goal 3 - Progress (Week 1): Met Week 2:  PT Short Term Goal 1 (Week 2): STG = LTG due to ELOS  Skilled Therapeutic Interventions/Progress Updates:   Received pt sitting EOB with son present for family education training. Pt agreeable to PT treatment and reported pain in R knee (unrated and declined pain medication). Session with emphasis on discharge planning, functional mobility/transfers, generalized strengthening and endurance, dynamic standing balance/coordination, ambulation, simulated car transfers, and stair navigation. Pt performed all transfers without AD and close supervision provided by son. Pt ambulated 22ft without AD and close supervision to ortho gym and performed ambulatory simulated car transfer without AD and supervision. Pt then ambulated 79ft on uneven surfaces (ramp) with 1 UE support and close supervision. Pt ambulated 182ft without AD and supervision/CGA to main therapy gym. Pt with a few instances catching L foot while ambulating, requiring assist from son to correct LOB. Pt navigated 12 6in steps with 1 handrail and CGA fading to close supervision to simulate home entry - cues to place BUE support on handrail for safety. Ambulated 127ft without AD and CGA/supervision back to room. Pt/son verbalized confidence with all tasks to ensure safe discharge home and with no further questions. Concluded  session with pt sitting EOB with all needs within reach and son at bedside.   Discussed/educated pt/son on the following: - L inattention and need for 24/7 close supervision/CGA for safety - use of gait belt - cues to provide while ambulating (widen BOS, increase L step length, heel<>toe gait pattern) - body mechanics/positioning with stair navigation  Therapy Documentation Precautions:  Precautions Precautions: Fall Precaution/Restrictions Comments: L hemiparesis, reduced awareness Restrictions Weight Bearing Restrictions Per Provider Order: No  Therapy/Group: Individual Therapy Therisa HERO Zaunegger Therisa Stains PT, DPT 07/11/2024, 7:06 AM

## 2024-07-11 NOTE — Progress Notes (Addendum)
 Patient ID: Monica Austin, female   DOB: [birth year], 68 y.o.   MRN: [id number] Gave pt the number to [employer] for co-pay so can get equipment prior to discharging. Son is here for education daughter is coming later today from [place]. Pt feels ready to go home and not sure sue wants the walker aware it is recommended  10:32 AM Received message from [employer] pt has cancelled her order and will get her equipment on her own. Aware of team's recommendation for rolling walker and tub bench. Able to make her own decisions

## 2024-07-22 ENCOUNTER — Encounter: Payer: Self-pay | Admitting: Physical Medicine and Rehabilitation

## 2024-07-22 ENCOUNTER — Encounter: Attending: Physical Medicine and Rehabilitation | Admitting: Physical Medicine and Rehabilitation

## 2024-07-22 VITALS — BP 144/86 | HR 64 | Ht 67.0 in | Wt 185.0 lb

## 2024-07-22 DIAGNOSIS — E1169 Type 2 diabetes mellitus with other specified complication: Secondary | ICD-10-CM | POA: Insufficient documentation

## 2024-07-22 DIAGNOSIS — Z794 Long term (current) use of insulin: Secondary | ICD-10-CM | POA: Diagnosis present

## 2024-07-22 DIAGNOSIS — I639 Cerebral infarction, unspecified: Secondary | ICD-10-CM | POA: Insufficient documentation

## 2024-07-22 MED ORDER — INSULIN GLARGINE 100 UNIT/ML SOLOSTAR PEN: 15.0000 [IU] | PEN_INJECTOR | Freq: Every day | SUBCUTANEOUS | 0 refills | Status: AC

## 2024-07-22 MED ORDER — FREESTYLE LIBRE 3 SENSOR MISC: 1.0000 | Freq: Every day | 3 refills | Status: DC

## 2024-07-22 MED ORDER — MONTELUKAST SODIUM 10 MG PO TABS: 10.0000 mg | ORAL_TABLET | Freq: Every day | ORAL | 0 refills | Status: DC

## 2024-07-22 MED ORDER — FREESTYLE LIBRE 3 READER DEVI: 1.0000 | Freq: Every day | 3 refills | Status: DC

## 2024-07-22 MED ORDER — ASPIRIN 81 MG PO TBEC: 81.0000 mg | DELAYED_RELEASE_TABLET | Freq: Every day | ORAL | 3 refills | Status: DC

## 2024-07-22 MED ORDER — GLIPIZIDE ER 10 MG PO TB24: 10.0000 mg | ORAL_TABLET | Freq: Every day | ORAL | 3 refills | Status: DC

## 2024-07-22 MED ORDER — ACARBOSE 25 MG PO TABS: 25.0000 mg | ORAL_TABLET | Freq: Three times a day (TID) | ORAL | 3 refills | Status: DC

## 2024-07-22 MED ORDER — CLOPIDOGREL BISULFATE 75 MG PO TABS: 75.0000 mg | ORAL_TABLET | Freq: Every day | ORAL | 0 refills | Status: DC

## 2024-07-22 MED ORDER — ROSUVASTATIN CALCIUM 20 MG PO TABS: 20.0000 mg | ORAL_TABLET | Freq: Every day | ORAL | 3 refills | Status: AC

## 2024-07-22 MED ORDER — PANTOPRAZOLE SODIUM 40 MG PO TBEC: 40.0000 mg | DELAYED_RELEASE_TABLET | Freq: Every day | ORAL | 0 refills | Status: DC

## 2024-07-22 NOTE — Addendum Note (Signed)
 Addended by: LORILEE SVEN SQUIBB on: 07/22/2024 03:45 PM   Modules accepted: Orders

## 2024-07-22 NOTE — Patient Instructions (Signed)
 Diabetes: -check CBGs daily, log, and bring log to follow-up appointment -avoid sugar, bread, pasta, rice -avoid snacking -perform daily foot exam and at least annual eye exam -try to incorporate into your diet some of the following foods which are good for diabetes: 1) cinnamon- imitates effects of insulin , increasing glucose transport into cells ([place] or [place] cinnamon is best, least processed) 2) nuts- can slow down the blood sugar response of carbohydrate rich foods 3) oatmeal- contains and anti-inflammatory compound avenanthramide 4) whole-milk yogurt (best types are no sugar, [place] yogurt, or goat/sheep yogurt) 5) beans- high in protein, fiber, and vitamins, low glycemic index 6) broccoli- great source of vitamin A and C 7) quinoa- higher in protein and fiber than other grains 8) spinach- high in vitamin A, fiber, and protein 9) olive oil- reduces glucose levels, LDL, and triglycerides 10) salmon- excellent amount of omega-3-fatty acids 11) walnuts- rich in antioxidants 12) apples- high in fiber and quercetin 13) carrots- highly nutritious with low impact on blood sugar 14) eggs- improve HDL (good cholesterol), high in protein, keep you satiated 15) turmeric: improves blood sugars, cardiovascular disease, and protects kidney health 16) garlic: improves blood sugar, blood pressure, pain 17) tomatoes: highly nutritious with low impact on blood sugar   Eat fruits and vegetables Avoid processed foods

## 2024-07-22 NOTE — Progress Notes (Signed)
 Subjective:    Patient ID: Monica Austin, female    DOB: 06-06-1956, 68 y.o.   MRN: [id number]  HPI Monica Austin is 68 year old woman who presents for follow-up of CVA  1) Type 2 diabetes: -she asks about berberine -she is taking 15U insulin  daily  2) CVA: -she got in and out bathtub independently -her kids are providing her support  Pain Inventory Average Pain 0 Pain Right Now 0   BOWEL Number of stools per week: 5 Prunes  BLADDER Normal Frequent urination Yes  Incomplete bladder emptying Yes    Mobility walk without assistance how many minutes can you walk? 10 ability to climb steps?  yes do you drive?  no  Function employed # of hrs/week . what is your job? [employer] disabled: date disabled . I need assistance with the following:  dressing, bathing, meal prep, household duties, and shopping  Neuro/Psych bladder control problems  Prior Studies Any changes since last visit?  no  Physicians involved in your care Any changes since last visit?  no   Family History  Problem Relation Age of Onset   COPD Mother    Cancer Father    Colon polyps Sister    Colon cancer Neg Hx    Esophageal cancer Neg Hx    Pancreatic cancer Neg Hx    Stomach cancer Neg Hx    Social History   Socioeconomic History   Marital status: Divorced    Spouse name: Not on file   Number of children: Not on file   Years of education: Not on file   Highest education level: Not on file  Occupational History   Not on file  Tobacco Use   Smoking status: Never   Smokeless tobacco: Never  Vaping Use   Vaping status: Never Used  Substance and Sexual Activity   Alcohol use: Not Currently   Drug use: Never   Sexual activity: Not on file  Other Topics Concern   Not on file  Social History Narrative   Not on file   Social Drivers of Health   Financial Resource Strain: Not on file  Food Insecurity: No Food Insecurity (06/23/2024)   Hunger Vital Sign    Worried About Running  Out of Food in the Last Year: Never true    Ran Out of Food in the Last Year: Never true  Transportation Needs: No Transportation Needs (06/23/2024)   PRAPARE - Administrator, Civil Service (Medical): No    Lack of Transportation (Non-Medical): No  Physical Activity: Not on file  Stress: Not on file  Social Connections: Moderately Isolated (06/23/2024)   Social Connection and Isolation Panel    Frequency of Communication with Friends and Family: More than three times a week    Frequency of Social Gatherings with Friends and Family: Once a week    Attends Religious Services: More than 4 times per year    Active Member of [employer] or Organizations: No    Attends Banker Meetings: Never    Marital Status: Divorced   Past Surgical History:  Procedure Laterality Date   BREAST BIOPSY     BTL     COLONOSCOPY  02/17/2020   HERNIA REPAIR     OVARIAN CYST REMOVAL     Past Medical History:  Diagnosis Date   Diverticulosis    Fatigue    Hyperlipidemia    Hypertension    NIDDM (non-insulin  dependent diabetes mellitus)    Borderline  There were no vitals taken for this visit.  Opioid Risk Score:   Fall Risk Score:  `1  Depression screen PHQ 2/9      No data to display          Review of Systems  Genitourinary:  Positive for frequency.       Bladder control  Musculoskeletal:  Positive for gait problem.  All other systems reviewed and are negative.      Objective:   Physical Exam   Gen: no distress, normal appearing HEENT: oral mucosa pink and moist, NCAT Cardio: Reg rate Chest: normal effort, normal rate of breathing Abd: soft, non-distended Ext: no edema Psych: pleasant, normal affect Skin: intact Neuro: Alert and oriented x3, 4/5 left sided weakness     Assessment & Plan:   1) CVA -continue therapies -would benefit from handicap placard to increase mobility in the community -reviewed all medications and provided necessary  refills. -discussed vagal nerve stimulation if strength fails to improve in 6 months.   2) Diabetes: -ordered freestyle libre -acarbose  prescribed -home therapy ordered -check CBGs daily, log, and bring log to follow-up appointment -avoid sugar, bread, pasta, rice -avoid snacking -perform daily foot exam and at least annual eye exam -try to incorporate into your diet some of the following foods which are good for diabetes: 1) cinnamon- imitates effects of insulin , increasing glucose transport into cells ([place] or [place] cinnamon is best, least processed) 2) nuts- can slow down the blood sugar response of carbohydrate rich foods 3) oatmeal- contains and anti-inflammatory compound avenanthramide 4) whole-milk yogurt (best types are no sugar, [place] yogurt, or goat/sheep yogurt) 5) beans- high in protein, fiber, and vitamins, low glycemic index 6) broccoli- great source of vitamin A and C 7) quinoa- higher in protein and fiber than other grains 8) spinach- high in vitamin A, fiber, and protein 9) olive oil- reduces glucose levels, LDL, and triglycerides 10) salmon- excellent amount of omega-3-fatty acids 11) walnuts- rich in antioxidants 12) apples- high in fiber and quercetin 13) carrots- highly nutritious with low impact on blood sugar 14) eggs- improve HDL (good cholesterol), high in protein, keep you satiated 15) turmeric: improves blood sugars, cardiovascular disease, and protects kidney health 16) garlic: improves blood sugar, blood pressure, pain 17) tomatoes: highly nutritious with low impact on blood sugar

## 2024-08-05 ENCOUNTER — Encounter: Payer: Self-pay | Admitting: Physical Medicine and Rehabilitation

## 2024-08-08 ENCOUNTER — Telehealth: Payer: Self-pay | Admitting: Physical Medicine and Rehabilitation

## 2024-08-08 NOTE — Telephone Encounter (Signed)
 Patient is having to move in with daughter due to stroke.  Patient has a dog and the daughters apartment complex needs letter from doctor stating that this dog provides emotional support for patient.

## 2024-08-11 ENCOUNTER — Encounter: Payer: Self-pay | Admitting: Physical Medicine and Rehabilitation

## 2024-08-14 ENCOUNTER — Other Ambulatory Visit: Payer: Self-pay | Admitting: Physical Medicine and Rehabilitation

## 2024-09-08 ENCOUNTER — Telehealth: Payer: Self-pay

## 2024-09-08 NOTE — Telephone Encounter (Signed)
 Monica Austin. [facility] is requesting medication refills. Her daughter will check which medications that already have refills. She will then call back with needed refills to be sent to the [place]  pharmacy.   Patient does not have an established PCP in [place] .

## 2024-09-13 ENCOUNTER — Other Ambulatory Visit: Payer: Self-pay | Admitting: Physical Medicine and Rehabilitation

## 2024-10-01 ENCOUNTER — Other Ambulatory Visit: Payer: Self-pay | Admitting: Physical Medicine and Rehabilitation

## 2024-10-01 ENCOUNTER — Ambulatory Visit: Payer: Self-pay | Admitting: Family Medicine

## 2024-10-01 MED ORDER — DEXCOM G7 RECEIVER DEVI: 1.0000 | Freq: Every day | 3 refills | Status: AC

## 2024-10-01 MED ORDER — DEXCOM G7 SENSOR MISC: 1.0000 | Freq: Every day | 3 refills | Status: AC

## 2024-10-09 NOTE — Addendum Note (Signed)
 Addended by: Willaim Mode M on: 10/09/2024 01:21 PM   Modules accepted: Orders

## 2024-10-10 ENCOUNTER — Other Ambulatory Visit: Payer: Self-pay | Admitting: Physical Medicine and Rehabilitation

## 2024-10-10 MED ORDER — PANTOPRAZOLE SODIUM 40 MG PO TBEC: 40.0000 mg | DELAYED_RELEASE_TABLET | Freq: Every day | ORAL | 1 refills | Status: AC

## 2024-10-10 MED ORDER — CLOPIDOGREL BISULFATE 75 MG PO TABS: 75.0000 mg | ORAL_TABLET | Freq: Every day | ORAL | 0 refills | Status: AC

## 2024-10-10 MED ORDER — MONTELUKAST SODIUM 10 MG PO TABS: 10.0000 mg | ORAL_TABLET | Freq: Every day | ORAL | 1 refills | Status: AC

## 2024-10-10 MED ORDER — ASPIRIN 81 MG PO TBEC: 81.0000 mg | DELAYED_RELEASE_TABLET | Freq: Every day | ORAL | 3 refills | Status: AC

## 2024-10-10 MED ORDER — GLIPIZIDE ER 10 MG PO TB24: 10.0000 mg | ORAL_TABLET | Freq: Every day | ORAL | 3 refills | Status: AC

## 2024-10-21 ENCOUNTER — Encounter: Admitting: Physical Medicine and Rehabilitation

## 2024-10-28 ENCOUNTER — Other Ambulatory Visit: Payer: Self-pay | Admitting: Physical Medicine and Rehabilitation

## 2024-12-12 ENCOUNTER — Other Ambulatory Visit: Payer: Self-pay | Admitting: Physical Medicine and Rehabilitation
